# Patient Record
Sex: Female | Born: 1995 | Race: White | Hispanic: No | Marital: Married | Smoking: Never smoker
Health system: Southern US, Community
[De-identification: ages and names within clinical notes are randomized; demographics above are authoritative.]

## PROBLEM LIST (undated history)

## (undated) ENCOUNTER — Inpatient Hospital Stay: Payer: Self-pay

## (undated) DIAGNOSIS — K219 Gastro-esophageal reflux disease without esophagitis: Secondary | ICD-10-CM

## (undated) DIAGNOSIS — G718 Other primary disorders of muscles: Secondary | ICD-10-CM

## (undated) DIAGNOSIS — N83209 Unspecified ovarian cyst, unspecified side: Secondary | ICD-10-CM

## (undated) DIAGNOSIS — F32A Depression, unspecified: Secondary | ICD-10-CM

## (undated) DIAGNOSIS — F329 Major depressive disorder, single episode, unspecified: Secondary | ICD-10-CM

## (undated) DIAGNOSIS — N809 Endometriosis, unspecified: Secondary | ICD-10-CM

## (undated) HISTORY — PX: NO PAST SURGERIES: SHX2092

## (undated) HISTORY — DX: Unspecified ovarian cyst, unspecified side: N83.209

## (undated) HISTORY — DX: Endometriosis, unspecified: N80.9

## (undated) HISTORY — PX: CHOLECYSTECTOMY: SHX55

## (undated) HISTORY — DX: Other primary disorders of muscles: G71.8

## (undated) HISTORY — PX: LOOP RECORDER IMPLANT: SHX5954

## (undated) HISTORY — PX: ABDOMINAL HYSTERECTOMY: SHX81

---
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
## 2013-04-11 ENCOUNTER — Ambulatory Visit: Payer: Self-pay | Admitting: Internal Medicine

## 2013-04-11 LAB — RAPID INFLUENZA A&B ANTIGENS

## 2013-05-08 ENCOUNTER — Emergency Department: Payer: Self-pay | Admitting: Emergency Medicine

## 2013-05-08 LAB — CBC
HCT: 47.7 % — ABNORMAL HIGH (ref 35.0–47.0)
HGB: 16.1 g/dL — ABNORMAL HIGH (ref 12.0–16.0)
MCH: 29.2 pg (ref 26.0–34.0)
MCHC: 33.7 g/dL (ref 32.0–36.0)
MCV: 87 fL (ref 80–100)
Platelet: 278 10*3/uL (ref 150–440)
RBC: 5.5 10*6/uL — ABNORMAL HIGH (ref 3.80–5.20)
RDW: 14.1 % (ref 11.5–14.5)
WBC: 7.7 10*3/uL (ref 3.6–11.0)

## 2013-05-08 LAB — URINALYSIS, COMPLETE
Bilirubin,UR: NEGATIVE
Glucose,UR: NEGATIVE mg/dL (ref 0–75)
KETONE: NEGATIVE
LEUKOCYTE ESTERASE: NEGATIVE
Nitrite: NEGATIVE
PROTEIN: NEGATIVE
Ph: 7 (ref 4.5–8.0)
SPECIFIC GRAVITY: 1.02 (ref 1.003–1.030)

## 2013-05-08 LAB — WET PREP, GENITAL

## 2013-05-08 LAB — GC/CHLAMYDIA PROBE AMP

## 2013-05-08 LAB — HCG, QUANTITATIVE, PREGNANCY

## 2013-07-19 ENCOUNTER — Ambulatory Visit: Payer: Self-pay | Admitting: Physician Assistant

## 2013-07-19 LAB — COMPREHENSIVE METABOLIC PANEL
Albumin: 4.3 g/dL (ref 3.8–5.6)
Alkaline Phosphatase: 126 U/L — ABNORMAL HIGH
Anion Gap: 9 (ref 7–16)
BILIRUBIN TOTAL: 0.4 mg/dL (ref 0.2–1.0)
BUN: 9 mg/dL (ref 7–18)
CALCIUM: 9.2 mg/dL (ref 9.0–10.7)
Chloride: 103 mmol/L (ref 98–107)
Co2: 27 mmol/L (ref 21–32)
Creatinine: 0.66 mg/dL (ref 0.60–1.30)
EGFR (Non-African Amer.): >60
Glucose: 86 mg/dL (ref 65–99)
OSMOLALITY: 276 (ref 275–301)
Potassium: 3.6 mmol/L (ref 3.5–5.1)
SGOT(AST): 13 U/L (ref 0–26)
SGPT (ALT): 22 U/L (ref 12–78)
Sodium: 139 mmol/L (ref 136–145)
Total Protein: 8 g/dL (ref 6.4–8.6)

## 2013-07-19 LAB — CBC WITH DIFFERENTIAL/PLATELET
BASOS PCT: 0.2 %
Basophil #: 0 10*3/uL (ref 0.0–0.1)
EOS ABS: 0.1 10*3/uL (ref 0.0–0.7)
EOS PCT: 1.4 %
HCT: 45.5 % (ref 35.0–47.0)
HGB: 15 g/dL (ref 12.0–16.0)
Lymphocyte #: 2.3 10*3/uL (ref 1.0–3.6)
Lymphocyte %: 21 %
MCH: 28.7 pg (ref 26.0–34.0)
MCHC: 32.9 g/dL (ref 32.0–36.0)
MCV: 87 fL (ref 80–100)
MONOS PCT: 7.6 %
Monocyte #: 0.8 x10 3/mm (ref 0.2–0.9)
Neutrophil #: 7.5 10*3/uL — ABNORMAL HIGH (ref 1.4–6.5)
Neutrophil %: 69.8 %
Platelet: 288 10*3/uL (ref 150–440)
RBC: 5.23 10*6/uL — AB (ref 3.80–5.20)
RDW: 13.7 % (ref 11.5–14.5)
WBC: 10.8 10*3/uL (ref 3.6–11.0)

## 2013-07-19 LAB — URINALYSIS, COMPLETE
GLUCOSE, UR: NEGATIVE mg/dL (ref 0–75)
KETONE: NEGATIVE
Leukocyte Esterase: NEGATIVE
NITRITE: NEGATIVE
Ph: 7.5 (ref 4.5–8.0)
Specific Gravity: 1.015 (ref 1.003–1.030)

## 2013-07-19 LAB — PREGNANCY, URINE: Pregnancy Test, Urine: NEGATIVE m[IU]/mL

## 2013-07-20 ENCOUNTER — Emergency Department: Payer: Self-pay | Admitting: Emergency Medicine

## 2013-07-20 LAB — COMPREHENSIVE METABOLIC PANEL
ALBUMIN: 4.5 g/dL (ref 3.8–5.6)
Alkaline Phosphatase: 129 U/L — ABNORMAL HIGH
Anion Gap: 5 — ABNORMAL LOW (ref 7–16)
BILIRUBIN TOTAL: 0.3 mg/dL (ref 0.2–1.0)
BUN: 9 mg/dL (ref 7–18)
CALCIUM: 9.4 mg/dL (ref 9.0–10.7)
CHLORIDE: 108 mmol/L — AB (ref 98–107)
CO2: 24 mmol/L (ref 21–32)
Creatinine: 0.54 mg/dL — ABNORMAL LOW (ref 0.60–1.30)
EGFR (Non-African Amer.): >60
Glucose: 89 mg/dL (ref 65–99)
OSMOLALITY: 272 (ref 275–301)
Potassium: 3.7 mmol/L (ref 3.5–5.1)
SGOT(AST): 19 U/L (ref 0–26)
SGPT (ALT): 21 U/L (ref 12–78)
Sodium: 137 mmol/L (ref 136–145)
Total Protein: 8.4 g/dL (ref 6.4–8.6)

## 2013-07-20 LAB — CBC WITH DIFFERENTIAL/PLATELET
BASOS ABS: 0 10*3/uL (ref 0.0–0.1)
Basophil %: 0.2 %
EOS PCT: 0.4 %
Eosinophil #: 0.1 10*3/uL (ref 0.0–0.7)
HCT: 47.8 % — ABNORMAL HIGH (ref 35.0–47.0)
HGB: 15.3 g/dL (ref 12.0–16.0)
LYMPHS ABS: 2.2 10*3/uL (ref 1.0–3.6)
Lymphocyte %: 15.2 %
MCH: 28 pg (ref 26.0–34.0)
MCHC: 32 g/dL (ref 32.0–36.0)
MCV: 88 fL (ref 80–100)
Monocyte #: 0.7 x10 3/mm (ref 0.2–0.9)
Monocyte %: 4.6 %
NEUTROS ABS: 11.5 10*3/uL — AB (ref 1.4–6.5)
Neutrophil %: 79.6 %
PLATELETS: 299 10*3/uL (ref 150–440)
RBC: 5.47 10*6/uL — ABNORMAL HIGH (ref 3.80–5.20)
RDW: 13.8 % (ref 11.5–14.5)
WBC: 14.4 10*3/uL — ABNORMAL HIGH (ref 3.6–11.0)

## 2013-07-20 LAB — URINALYSIS, COMPLETE
Bacteria: NONE SEEN
Bilirubin,UR: NEGATIVE
Glucose,UR: NEGATIVE mg/dL (ref 0–75)
Ketone: NEGATIVE
Leukocyte Esterase: NEGATIVE
Nitrite: NEGATIVE
PH: 7 (ref 4.5–8.0)
Protein: NEGATIVE
RBC,UR: <1 /HPF (ref 0–5)
Specific Gravity: 1.004 (ref 1.003–1.030)

## 2013-09-07 ENCOUNTER — Emergency Department: Payer: Self-pay | Admitting: Emergency Medicine

## 2013-09-07 LAB — URINALYSIS, COMPLETE
BILIRUBIN, UR: NEGATIVE
Bacteria: NONE SEEN
GLUCOSE, UR: NEGATIVE mg/dL (ref 0–75)
Ketone: NEGATIVE
LEUKOCYTE ESTERASE: NEGATIVE
Nitrite: NEGATIVE
PROTEIN: NEGATIVE
Ph: 5 (ref 4.5–8.0)
Specific Gravity: 1.023 (ref 1.003–1.030)
WBC UR: NONE SEEN /HPF (ref 0–5)

## 2013-09-07 LAB — CBC WITH DIFFERENTIAL/PLATELET
BASOS ABS: 0 10*3/uL (ref 0.0–0.1)
Basophil %: 0.3 %
EOS ABS: 0.2 10*3/uL (ref 0.0–0.7)
Eosinophil %: 1.7 %
HCT: 46.5 % (ref 35.0–47.0)
HGB: 15.1 g/dL (ref 12.0–16.0)
Lymphocyte #: 2 10*3/uL (ref 1.0–3.6)
Lymphocyte %: 18.4 %
MCH: 28.7 pg (ref 26.0–34.0)
MCHC: 32.4 g/dL (ref 32.0–36.0)
MCV: 89 fL (ref 80–100)
Monocyte #: 0.8 x10 3/mm (ref 0.2–0.9)
Monocyte %: 7.6 %
NEUTROS ABS: 7.7 10*3/uL — AB (ref 1.4–6.5)
NEUTROS PCT: 72 %
Platelet: 234 10*3/uL (ref 150–440)
RBC: 5.25 10*6/uL — ABNORMAL HIGH (ref 3.80–5.20)
RDW: 13.4 % (ref 11.5–14.5)
WBC: 10.7 10*3/uL (ref 3.6–11.0)

## 2013-09-07 LAB — COMPREHENSIVE METABOLIC PANEL
ANION GAP: 7 (ref 7–16)
AST: 22 U/L (ref 0–26)
Albumin: 4.1 g/dL (ref 3.8–5.6)
Alkaline Phosphatase: 120 U/L — ABNORMAL HIGH
BILIRUBIN TOTAL: 0.5 mg/dL (ref 0.2–1.0)
BUN: 10 mg/dL (ref 7–18)
CHLORIDE: 105 mmol/L (ref 98–107)
CO2: 25 mmol/L (ref 21–32)
Calcium, Total: 9.2 mg/dL (ref 9.0–10.7)
Creatinine: 0.56 mg/dL — ABNORMAL LOW (ref 0.60–1.30)
EGFR (African American): >60
EGFR (Non-African Amer.): >60
GLUCOSE: 85 mg/dL (ref 65–99)
Osmolality: 272 (ref 275–301)
POTASSIUM: 3.9 mmol/L (ref 3.5–5.1)
SGPT (ALT): 23 U/L (ref 12–78)
SODIUM: 137 mmol/L (ref 136–145)
Total Protein: 7.8 g/dL (ref 6.4–8.6)

## 2013-09-07 LAB — LIPASE, BLOOD: LIPASE: 141 U/L (ref 73–393)

## 2013-12-14 ENCOUNTER — Inpatient Hospital Stay: Payer: Self-pay | Admitting: Obstetrics and Gynecology

## 2013-12-14 LAB — COMPREHENSIVE METABOLIC PANEL
ALBUMIN: 4 g/dL (ref 3.8–5.6)
Alkaline Phosphatase: 97 U/L
Anion Gap: 9 (ref 7–16)
BILIRUBIN TOTAL: 0.3 mg/dL (ref 0.2–1.0)
BUN: 11 mg/dL (ref 7–18)
CO2: 25 mmol/L (ref 21–32)
CREATININE: 0.66 mg/dL (ref 0.60–1.30)
Calcium, Total: 9.3 mg/dL (ref 9.0–10.7)
Chloride: 105 mmol/L (ref 98–107)
EGFR (Non-African Amer.): >60
GLUCOSE: 83 mg/dL (ref 65–99)
OSMOLALITY: 276 (ref 275–301)
Potassium: 3.9 mmol/L (ref 3.5–5.1)
SGOT(AST): 21 U/L (ref 0–26)
SGPT (ALT): 21 U/L
Sodium: 139 mmol/L (ref 136–145)
Total Protein: 8 g/dL (ref 6.4–8.6)

## 2013-12-14 LAB — CBC WITH DIFFERENTIAL/PLATELET
BASOS ABS: 0 10*3/uL (ref 0.0–0.1)
BASOS PCT: 0.2 %
EOS ABS: 0.1 10*3/uL (ref 0.0–0.7)
Eosinophil %: 0.8 %
HCT: 43.7 % (ref 35.0–47.0)
HGB: 14.4 g/dL (ref 12.0–16.0)
Lymphocyte #: 2.9 10*3/uL (ref 1.0–3.6)
Lymphocyte %: 20.1 %
MCH: 29.2 pg (ref 26.0–34.0)
MCHC: 32.9 g/dL (ref 32.0–36.0)
MCV: 89 fL (ref 80–100)
MONO ABS: 1 x10 3/mm — AB (ref 0.2–0.9)
MONOS PCT: 6.9 %
Neutrophil #: 10.6 10*3/uL — ABNORMAL HIGH (ref 1.4–6.5)
Neutrophil %: 72 %
Platelet: 266 10*3/uL (ref 150–440)
RBC: 4.93 10*6/uL (ref 3.80–5.20)
RDW: 13.1 % (ref 11.5–14.5)
WBC: 14.7 10*3/uL — AB (ref 3.6–11.0)

## 2013-12-14 LAB — URINALYSIS, COMPLETE
BILIRUBIN, UR: NEGATIVE
Bacteria: NONE SEEN
GLUCOSE, UR: NEGATIVE mg/dL (ref 0–75)
Ketone: NEGATIVE
Leukocyte Esterase: NEGATIVE
Nitrite: NEGATIVE
PROTEIN: NEGATIVE
Ph: 7 (ref 4.5–8.0)
RBC,UR: 1 /HPF (ref 0–5)
Specific Gravity: 1.01 (ref 1.003–1.030)
WBC UR: <1 /HPF (ref 0–5)

## 2013-12-14 LAB — LIPASE, BLOOD: Lipase: 173 U/L (ref 73–393)

## 2013-12-14 LAB — GC/CHLAMYDIA PROBE AMP

## 2013-12-15 LAB — CBC WITH DIFFERENTIAL/PLATELET
BASOS ABS: 0 10*3/uL (ref 0.0–0.1)
Basophil %: 0.1 %
EOS ABS: 0.1 10*3/uL (ref 0.0–0.7)
EOS PCT: 0.8 %
HCT: 42 % (ref 35.0–47.0)
HGB: 14.1 g/dL (ref 12.0–16.0)
Lymphocyte #: 3.4 10*3/uL (ref 1.0–3.6)
Lymphocyte %: 23.6 %
MCH: 29.5 pg (ref 26.0–34.0)
MCHC: 33.6 g/dL (ref 32.0–36.0)
MCV: 88 fL (ref 80–100)
Monocyte #: 1.2 x10 3/mm — ABNORMAL HIGH (ref 0.2–0.9)
Monocyte %: 8.6 %
NEUTROS PCT: 66.9 %
Neutrophil #: 9.6 10*3/uL — ABNORMAL HIGH (ref 1.4–6.5)
PLATELETS: 245 10*3/uL (ref 150–440)
RBC: 4.77 10*6/uL (ref 3.80–5.20)
RDW: 13.2 % (ref 11.5–14.5)
WBC: 14.3 10*3/uL — AB (ref 3.6–11.0)

## 2013-12-16 LAB — CBC WITH DIFFERENTIAL/PLATELET
BASOS ABS: 0 10*3/uL (ref 0.0–0.1)
BASOS PCT: 0.1 %
EOS ABS: 0.2 10*3/uL (ref 0.0–0.7)
EOS PCT: 1.3 %
HCT: 42.5 % (ref 35.0–47.0)
HGB: 14.2 g/dL (ref 12.0–16.0)
LYMPHS ABS: 2 10*3/uL (ref 1.0–3.6)
Lymphocyte %: 15.6 %
MCH: 29.3 pg (ref 26.0–34.0)
MCHC: 33.4 g/dL (ref 32.0–36.0)
MCV: 88 fL (ref 80–100)
MONO ABS: 0.9 x10 3/mm (ref 0.2–0.9)
Monocyte %: 7.2 %
NEUTROS ABS: 10 10*3/uL — AB (ref 1.4–6.5)
Neutrophil %: 75.8 %
Platelet: 236 10*3/uL (ref 150–440)
RBC: 4.83 10*6/uL (ref 3.80–5.20)
RDW: 13.2 % (ref 11.5–14.5)
WBC: 13.2 10*3/uL — ABNORMAL HIGH (ref 3.6–11.0)

## 2013-12-16 LAB — PROTIME-INR
INR: 1.1
Prothrombin Time: 13.7 secs (ref 11.5–14.7)

## 2013-12-16 LAB — APTT: Activated PTT: 26.3 secs (ref 23.6–35.9)

## 2013-12-17 LAB — CBC WITH DIFFERENTIAL/PLATELET
Basophil #: 0 10*3/uL (ref 0.0–0.1)
Basophil %: 0.2 %
EOS PCT: 0.6 %
Eosinophil #: 0.1 10*3/uL (ref 0.0–0.7)
HCT: 40.5 % (ref 35.0–47.0)
HGB: 13.1 g/dL (ref 12.0–16.0)
LYMPHS ABS: 1.6 10*3/uL (ref 1.0–3.6)
LYMPHS PCT: 11.8 %
MCH: 28.3 pg (ref 26.0–34.0)
MCHC: 32.5 g/dL (ref 32.0–36.0)
MCV: 87 fL (ref 80–100)
MONO ABS: 1.2 x10 3/mm — AB (ref 0.2–0.9)
MONOS PCT: 8.7 %
NEUTROS ABS: 10.7 10*3/uL — AB (ref 1.4–6.5)
Neutrophil %: 78.7 %
Platelet: 222 10*3/uL (ref 150–440)
RBC: 4.64 10*6/uL (ref 3.80–5.20)
RDW: 13 % (ref 11.5–14.5)
WBC: 13.7 10*3/uL — AB (ref 3.6–11.0)

## 2013-12-17 LAB — CREATININE, SERUM
Creatinine: 0.55 mg/dL — ABNORMAL LOW (ref 0.60–1.30)
EGFR (African American): >60
EGFR (Non-African Amer.): >60

## 2013-12-17 LAB — RAPID HIV SCREEN (HIV 1/2 AB+AG)

## 2013-12-18 LAB — CBC WITH DIFFERENTIAL/PLATELET
BASOS PCT: 0.1 %
Basophil #: 0 10*3/uL (ref 0.0–0.1)
EOS ABS: 0.3 10*3/uL (ref 0.0–0.7)
Eosinophil %: 3.3 %
HCT: 39.7 % (ref 35.0–47.0)
HGB: 13.4 g/dL (ref 12.0–16.0)
LYMPHS ABS: 1.5 10*3/uL (ref 1.0–3.6)
Lymphocyte %: 18.2 %
MCH: 29.7 pg (ref 26.0–34.0)
MCHC: 33.8 g/dL (ref 32.0–36.0)
MCV: 88 fL (ref 80–100)
MONO ABS: 0.8 x10 3/mm (ref 0.2–0.9)
MONOS PCT: 9.6 %
NEUTROS PCT: 68.8 %
Neutrophil #: 5.7 10*3/uL (ref 1.4–6.5)
Platelet: 212 10*3/uL (ref 150–440)
RBC: 4.52 10*6/uL (ref 3.80–5.20)
RDW: 13.3 % (ref 11.5–14.5)
WBC: 8.3 10*3/uL (ref 3.6–11.0)

## 2013-12-19 LAB — CBC WITH DIFFERENTIAL/PLATELET
BASOS PCT: 0.4 %
Basophil #: 0 10*3/uL (ref 0.0–0.1)
Eosinophil #: 0.3 10*3/uL (ref 0.0–0.7)
Eosinophil %: 3.9 %
HCT: 41.2 % (ref 35.0–47.0)
HGB: 13.8 g/dL (ref 12.0–16.0)
LYMPHS PCT: 23.2 %
Lymphocyte #: 1.8 10*3/uL (ref 1.0–3.6)
MCH: 29.5 pg (ref 26.0–34.0)
MCHC: 33.6 g/dL (ref 32.0–36.0)
MCV: 88 fL (ref 80–100)
MONOS PCT: 12.2 %
Monocyte #: 0.9 x10 3/mm (ref 0.2–0.9)
Neutrophil #: 4.7 10*3/uL (ref 1.4–6.5)
Neutrophil %: 60.3 %
Platelet: 222 10*3/uL (ref 150–440)
RBC: 4.69 10*6/uL (ref 3.80–5.20)
RDW: 13.1 % (ref 11.5–14.5)
WBC: 7.7 10*3/uL (ref 3.6–11.0)

## 2014-07-23 NOTE — Consult Note (Signed)
OBGYN Consult Noteof Consult: 9/15/2015of Admission: 9/15/2015Physician: Dr. [place] Bingharlie Vanna Shavers, MD ([facility])OBGYN: Dr. Bonney AidStaebler ([facility])Phys[facility]: [facility] pain x 5 17 y/o [id number] (LMP 11/22/2013, with Mirena in place) with above CC. Hx significant for history of PP depression and irregular heartbeat.  Patient states she's had the pain for 5 days, feels crampy all the time, sharpness that comes and goes and nausea w/o vomiting that comes and goes.  She states she had a temp to 100.0 at home but no dysuria, hematuria, diarrhea, vaginal bleeding or discharge.   nonenoneHx: TSVD x 1. no h/o STIs. Mirena placed 05/2012. no h/o papsnoneaugmentin (hives, rash)no h/o GYN cancersno tobacco/etoh/illicit drug VS normal and stableno MRGsmoderately TTP in lower abdomen but no peritoneal signsmildly TTP in mid low backnegative, per EERno c/c/e  TECHNIQUE:transabdominal and transvaginal ultrasound examinations of thewere performed. Transabdominal technique was performed forimaging of the pelvis including uterus, ovaries, adnexaland pelvic cul-de-sac. It was necessary to proceed withexam following the transabdominal exam to visualize theadnexal regions.  07/20/2013 and 07/19/2013  8.4 x 4.2 x 6.1 cm. No fibroids or other massThe uterus is retroverted.  5.5 mm.  Intrauterine device is identified.ovary 3.0 x 1.3 x 1.5 cm. Normal appearance/no adnexal mass. ovary 3.2 x 1.8 x 1.4 cm. Normal appearance/no adnexal mass.to the left ovary there is a tubular solid vascular structuremeasures 4.1 x 1.7 cm. Considerations include bowel orfallopian tube. No peristalsis is identified within the findings is moderate free pelvic fluid. Retroverted normal appearing uterus.Intrauterine device identified.Left adnexal tubular structure favored to represent thickenedtube.Pelvic inflammatory disease should be considered.A follow-up pelvic ultrasound is suggested in 8-12 weeks. Anmass has not been entirely excluded but  is felt to be less amylase, cmp, u/a, urine pregnancy testremarkable for wbc 14 with left shift pt with complex left adnexal structure concerning for TOA; pt currently stableto GYN: less concerning for torsion due to u/s characteristics, laterality and +AV on imagesmeropenem started. continue until clinical improvement with qday CBCs and once labs and s/s better can transition to PO. If no better, may need IR drainage.up GC/CT *FEN/GI: MIVF, regular diet, IV anti-emeticsPO pain meds prnOOB ad lib   Electronic Signatures: [place] BingPickens, Alaze Garverick (MD) (Signed on 15-Sep-15 22:03)  Authored   Last Updated: 15-Sep-15 22:58 by [place] BingPickens, Avo Schlachter (MD)

## 2014-07-23 NOTE — Consult Note (Signed)
PATIENT NAME:  Stephanie Hampton, Stephanie Hampton MR#:  [id number] DATE OF BIRTH:  1995/08/02  DATE OF CONSULTATION:  12/17/2013  REFERRING PHYSICIAN:  Conard NovakStephen D. Jackson, MD, OB/GYN  CONSULTING PHYSICIAN:  Stann Mainlandavid P. Sampson GoonFitzgerald, MD  REASON FOR CONSULTATION: Possible tubo-ovarian abscess.   HISTORY OF PRESENT ILLNESS: This is a very pleasant 19 year old female with no past medical history except for G1, P1, who uses an IUD for birth control and was admitted for left lower quadrant abdominal pain 5 days prior to admission. This was associated with some low-grade temperatures to 100.0 as well as some nausea. She does have chronic constipation and uses the toilet only every 2 to 3 days. She has not had any prior dysuria, hematuria, vaginal discharge, or bleeding.   PAST MEDICAL HISTORY:  Postpartum depression  and prior history of irregular heartbeat.  She has no history of STDs.   PAST SURGICAL HISTORY: None.   ALLERGIES: AUGMENTIN.   ANTIBIOTICS SINCE ADMISSION: Include meropenem.   FAMILY HISTORY: Noncontributory.   SOCIAL HISTORY: Lives with her partner. No tobacco, or alcohol, or drugs. She has a 791-month-old daughter. No sick contacts. Recent travel only to [place] within the last several weeks.  Has a pet dog at home, but no other animals.   PHYSICAL EXAMINATION: VITAL SIGNS: Temperature 98, pulse 72, blood pressure 99/62, respirations 18, saturations 96% on room air.  GENERAL: She is somewhat overweight, in no acute distress, lying in bed.  HEENT: Pupils equal, round, and reactive to light and accommodation. Oropharynx is clear without any thrush.  NECK:  Supple.  HEART: Regular.  LUNGS: Clear.  ABDOMEN: Soft, nondistended, and very mildly tender to palpation in the left lower quadrant. No rebound or guarding. Normal bowel sounds.  EXTREMITIES: No clubbing, cyanosis, or edema.  SKIN: No rash.   LABORATORY DATA: White blood count on admission was 14.7 with 10,600 neutrophils.  Currently, it is 13.7.   LFTs on admission showed a normal. Renal function is normal. Creatinine of 0.55. Wet prep was ordered, but not done. Gonorrhea and Chlamydia were negative, HIV negative. Urinalysis showed 1 white cell, otherwise negative.   IMAGING:  Ultrasound showed a normal uterus, IUD in place. There is a left adnexal tubular structure favored to represent thickened fallopian tube with some moderate amount of free  pelvic fluid. CT, done September 17, showed prominence of stool in the visualized colon and questionable small thickening in the distal descending colon. There was a crescentic fluid collection along the left adnexa with enhancing margins, likely a mildly dilated fallopian tube. There was prominent stool, mild thickening of the descending colon.   IMPRESSION: A 19 year old, relatively normal past medical history, admitted with left lower quadrant abdominal pain, possible findings of a tubo-ovarian abscess, white blood count of 15, and constipation. Since her CAT scan yesterday, she has been moving her bowels much more regularly, presumably due to the contrast. Her pain is slightly improving. She has not had a fever. SHE IS ALLERGIC TO AUGMENTIN.   RECOMMENDATIONS: 1.  Continue meropenem and doxycycline.  2.  HIV test was negative.  3.  If her pain persists, could consider working her up for GI causes. I suspect some of this pain is due to constipation and now that she is moving her bowels, she may improve.  4.  It is difficult to know what the next step should be since she has been covered quite broadly. I would suspect if her pain continues to improve, she could be discharged on levofloxacin  and Flagyl for another 7-day course with outpatient followup with gynecology to ensure clinical and possibly radiological resolution.   Thank you for the consult. I will be glad to follow with you.   ____________________________ Stann Mainland. Sampson Goon, MD dpf:lr D: 12/17/2013 16:43:43 ET T: 12/17/2013 18:30:23  ET JOB#: [id number]  cc: Stann Mainland. Sampson Goon, MD, <Dictator> Sharmila Wrobleski Sampson Goon MD ELECTRONICALLY SIGNED 01/02/2014 23:53

## 2014-08-01 ENCOUNTER — Ambulatory Visit (INDEPENDENT_AMBULATORY_CARE_PROVIDER_SITE_OTHER): Payer: BLUE CROSS/BLUE SHIELD

## 2014-08-01 ENCOUNTER — Encounter: Payer: Self-pay | Admitting: Podiatry

## 2014-08-01 ENCOUNTER — Ambulatory Visit (INDEPENDENT_AMBULATORY_CARE_PROVIDER_SITE_OTHER): Payer: BLUE CROSS/BLUE SHIELD | Admitting: Podiatry

## 2014-08-01 VITALS — Ht 64.0 in | Wt 175.0 lb

## 2014-08-01 DIAGNOSIS — M722 Plantar fascial fibromatosis: Secondary | ICD-10-CM

## 2014-08-01 DIAGNOSIS — M79672 Pain in left foot: Secondary | ICD-10-CM

## 2014-08-01 MED ORDER — METHYLPREDNISOLONE 4 MG PO TBPK: ORAL_TABLET | ORAL | Status: DC

## 2014-08-01 MED ORDER — MELOXICAM 15 MG PO TABS: 15.0000 mg | ORAL_TABLET | Freq: Every day | ORAL | Status: DC

## 2014-08-01 NOTE — Progress Notes (Signed)
   Subjective:    Patient ID: Stephanie Hampton, female    DOB: Jan 25, 1996, 19 y.o.   MRN: [id number] Pt comes in today with complains of her arch hurting non-stop. She has been seen at an Urgent Care for this . They gave her a ankle brace and did xrays. Urgent Care referred her to a specialists. She went to the Urgent Care 08/28/14.   HPI    Review of Systems  Musculoskeletal: Positive for gait problem.  All other systems reviewed and are negative.      Objective:   Physical Exam: I have reviewed her past mental history medications allergies surgery social history and review of systems. Pulses are strongly palpable bilateral. Neurologic sensorium is intact percent Weinstein monofilament. Deep tendon reflexes are intact bilateral and muscle strength was 5 over 5 dorsiflexion and plantar flexors and inverters everters all of his musculature is intact. Orthopedic evaluation demonstrates all joints distal to the ankle for range of motion without crepitation. Pain on palpation medial calcaneal tubercle of the left heel is prevalent. Radiographic evaluation does demonstrate soft tissue increase in density of the plantar fascia as it courses from the distal origin to its proximal insertion.        Assessment & Plan:  Assessment: Plantar fasciitis left.  Plan: Injected the left heel today with cortisone and local anesthetic. Discussed appropriate shoe gear stretching exercises and ice therapy. Wrote a prescription for Medrol Dosepak to be followed by meloxicam. Dispensed the plantar fascial brace and the night splint. We'll follow up with her in 1 month. We did discuss appropriate shoe gear stretching exercises ice therapy and sugar modifications

## 2014-08-09 NOTE — H&P (Signed)
[facility] Evaluation:  History:   HPI 19 year old G1 P0 with EDC=04/29/2012 by LMP=07/24/2011 presents at 2539 2/7 weeks with c/o worsening contractions. Contractions started last night and patient presented to [facility] earlier with irregular contractions. She was 1 cm at that time-was given Ambien and sent home. She returned with stronger more regular contractions this AM. [facility] for a 12 week ultrasound c/w dates, frequent syncopal episodes/past hx of irregular HR at had a cardiology consult including a Holter monitor, and nausea throughout pregnancy for which she takes Zofran. Has gained about 30# with the pregnancy. LABS: A POS, RI, VI, GBS negative. Received TDAP 11/21    Presents with contractions    Patient's Medical History Palpitations    Patient's Surgical History none    Medications Pre [place]atal Vitamins  Zofran    Allergies Augmentin-rash    Social History none    Family History Non-Contributory   ROS:   ROS see HPI   Exam:   Vital Signs stable  114/59    Urine Protein not completed    General Received Stadol for pain-pt sleepy. Pain relieved from 7 to 3    Mental Status clear    Chest clear    Heart normal sinus rhythm, no murmur/gallop/rubs    Abdomen gravid, tender with contractions    Estimated Fetal Weight Average for gestational age    Fetal Position cephalic    Edema no edema    Pelvic no external lesions, 3/90% per RN exam    Mebranes Intact    FHT normal rate with no decels, reactive strip with baseline 140s and accels    FHT Description mod variability-CAT 1    Fetal Heart Rate 145    Ucx regular, q3-4, mild    Skin dry   Impression:   Impression IUP at 39 2/7 weeks in early labor   Plan:   Plan EFM/NST, monitor contractions and for cervical change, Stadol for pain.   Electronic Signatures: Stephanie Hampton, Stephanie Hampton (CNM)  (Signed 24-Jan-14 07:37)  Authored: [facility] Evaluation   Last Updated: 24-Jan-14 07:37 by Stephanie Hampton,  Jacques Willingham Hampton (CNM)

## 2014-08-31 ENCOUNTER — Ambulatory Visit: Payer: BLUE CROSS/BLUE SHIELD | Admitting: Podiatry

## 2014-11-28 ENCOUNTER — Ambulatory Visit
Admission: EM | Admit: 2014-11-28 | Discharge: 2014-11-28 | Disposition: A | Discharge status: Home / Self Care | Payer: BLUE CROSS/BLUE SHIELD | Attending: Family Medicine | Admitting: Family Medicine

## 2014-11-28 ENCOUNTER — Encounter: Payer: Self-pay | Admitting: Emergency Medicine

## 2014-11-28 DIAGNOSIS — J029 Acute pharyngitis, unspecified: Secondary | ICD-10-CM | POA: Diagnosis not present

## 2014-11-28 DIAGNOSIS — J069 Acute upper respiratory infection, unspecified: Secondary | ICD-10-CM | POA: Diagnosis not present

## 2014-11-28 LAB — RAPID STREP SCREEN (MED CTR MEBANE ONLY): Streptococcus, Group A Screen (Direct): NEGATIVE

## 2014-11-28 MED ORDER — FEXOFENADINE-PSEUDOEPHED ER 60-120 MG PO TB12: 1.0000 | ORAL_TABLET | Freq: Two times a day (BID) | ORAL | Status: DC

## 2014-11-28 MED ORDER — AZITHROMYCIN 250 MG PO TABS: ORAL_TABLET | ORAL | Status: AC

## 2014-11-28 NOTE — Discharge Instructions (Signed)
Pharyngitis Pharyngitis is a sore throat (pharynx). There is redness, pain, and swelling of your throat. HOME CARE   Drink enough fluids to keep your pee (urine) clear or pale yellow.  Only take medicine as told by your doctor.  You may get sick again if you do not take medicine as told. Finish your medicines, even if you start to feel better.  Do not take aspirin.  Rest.  Rinse your mouth (gargle) with salt water ( tsp of salt per 1 qt of water) every 1-2 hours. This will help the pain.  If you are not at risk for choking, you can suck on hard candy or sore throat lozenges. GET HELP IF:  You have large, tender lumps on your neck.  You have a rash.  You cough up green, yellow-brown, or bloody spit. GET HELP RIGHT AWAY IF:   You have a stiff neck.  You drool or cannot swallow liquids.  You throw up (vomit) or are not able to keep medicine or liquids down.  You have very bad pain that does not go away with medicine.  You have problems breathing (not from a stuffy nose). MAKE SURE YOU:   Understand these instructions.  Will watch your condition.  Will get help right away if you are not doing well or get worse. Document Released: 09/04/2007 Document Revised: 01/06/2013 Document Reviewed: 11/23/2012 [facility] Patient Information 2015 [place], [place]. This information is not intended to replace advice given to you by your health care provider. Make sure you discuss any questions you have with your health care provider.  Salt Water Gargle This solution will help make your mouth and throat feel better. HOME CARE INSTRUCTIONS   Mix 1 teaspoon of salt in 8 ounces of warm water.  Gargle with this solution as much or often as you need or as directed. Swish and gargle gently if you have any sores or wounds in your mouth.  Do not swallow this mixture. Document Released: 12/21/2003 Document Revised: 06/10/2011 Document Reviewed: 05/13/2008 [facility] Patient Information 2015  [place], [place]. This information is not intended to replace advice given to you by your health care provider. Make sure you discuss any questions you have with your health care provider.  Upper Respiratory Infection, Adult An upper respiratory infection (URI) is also known as the common cold. It is often caused by a type of germ (virus). Colds are easily spread (contagious). You can pass it to others by kissing, coughing, sneezing, or drinking out of the same glass. Usually, you get better in 1 or 2 weeks.  HOME CARE   Only take medicine as told by your doctor.  Use a warm mist humidifier or breathe in steam from a hot shower.  Drink enough water and fluids to keep your pee (urine) clear or pale yellow.  Get plenty of rest.  Return to work when your temperature is back to normal or as told by your doctor. You may use a face mask and wash your hands to stop your cold from spreading. GET HELP RIGHT AWAY IF:   After the first few days, you feel you are getting worse.  You have questions about your medicine.  You have chills, shortness of breath, or brown or red spit (mucus).  You have yellow or brown snot (nasal discharge) or pain in the face, especially when you bend forward.  You have a fever, puffy (swollen) neck, pain when you swallow, or white spots in the back of your throat.  You  have a bad headache, ear pain, sinus pain, or chest pain.  You have a high-pitched whistling sound when you breathe in and out (wheezing).  You have a lasting cough or cough up blood.  You have sore muscles or a stiff neck. MAKE SURE YOU:   Understand these instructions.  Will watch your condition.  Will get help right away if you are not doing well or get worse. Document Released: 09/04/2007 Document Revised: 06/10/2011 Document Reviewed: 06/23/2013 [facility] Patient Information 2015 [place], [place]. This information is not intended to replace advice given to you by your health care provider.  Make sure you discuss any questions you have with your health care provider.

## 2014-11-28 NOTE — ED Provider Notes (Signed)
CSN: [id number]     Arrival date & time 11/28/14  1019 History   First MD Initiated Contact with Patient 11/28/14 1220     Chief Complaint  Patient presents with  . Sore Throat    She states that the sore throat has been going on for 5 days now. She does admit to having some rhinorrhea as well. (Consider location/radiation/quality/duration/timing/severity/associated sxs/prior Treatment) Patient is a 19 y.o. female presenting with pharyngitis. The history is provided by the patient. No language interpreter was used.  Sore Throat This is a new problem. The current episode started more than 2 days ago (five dsys). The problem occurs constantly. The problem has not changed since onset.Associated symptoms include headaches. Pertinent negatives include no chest pain, no abdominal pain and no shortness of breath. Nothing relieves the symptoms. Treatments tried: gargling w/salt water.     History reviewed. No pertinent past medical history. History reviewed. No pertinent past surgical history. History reviewed. No pertinent family history. Social History  Substance Use Topics  . Smoking status: Never Smoker   . Smokeless tobacco: Never Used  . Alcohol Use: No   OB History    No data available     Review of Systems  Constitutional: Negative.   HENT: Positive for rhinorrhea and sore throat. Negative for ear discharge, ear pain and facial swelling.   Respiratory: Negative for shortness of breath.   Cardiovascular: Negative for chest pain.  Gastrointestinal: Negative for abdominal pain.  Neurological: Positive for headaches.  All other systems reviewed and are negative.   Allergies  Amoxicillin-pot clavulanate  Home Medications   Prior to Admission medications   Medication Sig Start Date End Date Taking? Authorizing Provider  azithromycin (ZITHROMAX Z-PAK) 250 MG tablet Take 2 tablets first day and then 1 po a day for 4 days. If strep culture is positive with patient still symptomatic  after 8/31 until 9/30 patient may fill prescription. 11/30/14 12/30/14  Hassan Rowan, MD  fexofenadine-pseudoephedrine (ALLEGRA-D) 60-120 MG per tablet Take 1 tablet by mouth every 12 (twelve) hours. 11/28/14   Hassan Rowan, MD  meloxicam (MOBIC) 15 MG tablet Take 1 tablet (15 mg total) by mouth daily. 08/01/14   Max T Hyatt, DPM  methylPREDNISolone (MEDROL) 4 MG TBPK tablet Tapering 6 day dose pack 08/01/14   Max T Hyatt, DPM   Meds Ordered and Administered this Visit  Medications - No data to display   She denies any significant family medical history. Patient does not smoke. Nurse's notes reviewed   BP 121/61 mmHg  Pulse 71  Temp(Src) 98.5 F (36.9 C) (Tympanic)  Resp 16  Ht 5\' 4"  (1.626 m)  Wt 180 lb (81.647 kg)  BMI 30.88 kg/m2  SpO2 100%  LMP 11/26/2014 (Exact Date) No data found.   Physical Exam  Constitutional: She is oriented to person, place, and time. She appears well-developed and well-nourished.  HENT:  Head: Normocephalic.  Right Ear: Hearing, tympanic membrane and external ear normal.  Left Ear: Hearing, tympanic membrane and external ear normal.  Nose: Mucosal edema and rhinorrhea present. No epistaxis.  No foreign bodies. Right sinus exhibits frontal sinus tenderness. Right sinus exhibits no maxillary sinus tenderness. Left sinus exhibits no maxillary sinus tenderness and no frontal sinus tenderness.  Mouth/Throat: Uvula is midline and mucous membranes are normal. Posterior oropharyngeal erythema present.  Eyes: Pupils are equal, round, and reactive to light.  Neck: Normal range of motion. Neck supple. No tracheal deviation present.  Musculoskeletal: Normal range of motion.  Lymphadenopathy:    She has cervical adenopathy.  Neurological: She is oriented to person, place, and time.  Skin: Skin is warm and dry.  Psychiatric: She has a normal mood and affect.  Vitals reviewed.   ED Course  Procedures (including critical care time)  Labs Review Labs Reviewed   RAPID STREP SCREEN (NOT AT [facility])   Results for orders placed or performed during the hospital encounter of 11/28/14  Rapid strep screen  Result Value Ref Range   Streptococcus, Group A Screen (Direct) NEGATIVE NEGATIVE    Imaging Review No results found.   Visual Acuity Review  Right Eye Distance:   Left Eye Distance:   Bilateral Distance:    Right Eye Near:   Left Eye Near:    Bilateral Near:         MDM   1. Pharyngitis   2. URI, acute      At this time sore throat has been going on for 5 days. It appears to be more viral in allergy related than bacterial infection. We will place her on Allegra-D 1 tablet a day and work note for today and tomorrow. If she is not better on Wednesday, August 31 will allow her to get a predated prescription for Z-Pak. Also asked her to call to make sure that the strep culture is still negative.   Hassan Rowan, MD 11/28/14 1341

## 2014-11-28 NOTE — ED Notes (Signed)
Patient c/o sore throat for 5 days.  Patient denies fevers.

## 2014-11-30 LAB — CULTURE, GROUP A STREP (THRC)

## 2015-03-22 ENCOUNTER — Emergency Department
Admission: EM | Admit: 2015-03-22 | Discharge: 2015-03-22 | Disposition: A | Discharge status: Home / Self Care | Payer: BLUE CROSS/BLUE SHIELD | Attending: Emergency Medicine | Admitting: Emergency Medicine

## 2015-03-22 ENCOUNTER — Encounter: Payer: Self-pay | Admitting: *Deleted

## 2015-03-22 DIAGNOSIS — R1012 Left upper quadrant pain: Secondary | ICD-10-CM | POA: Diagnosis not present

## 2015-03-22 DIAGNOSIS — Z880 Allergy status to penicillin: Secondary | ICD-10-CM | POA: Insufficient documentation

## 2015-03-22 DIAGNOSIS — R197 Diarrhea, unspecified: Secondary | ICD-10-CM | POA: Diagnosis not present

## 2015-03-22 DIAGNOSIS — Z79899 Other long term (current) drug therapy: Secondary | ICD-10-CM | POA: Insufficient documentation

## 2015-03-22 DIAGNOSIS — R112 Nausea with vomiting, unspecified: Secondary | ICD-10-CM

## 2015-03-22 DIAGNOSIS — Z791 Long term (current) use of non-steroidal anti-inflammatories (NSAID): Secondary | ICD-10-CM | POA: Diagnosis not present

## 2015-03-22 LAB — URINALYSIS COMPLETE WITH MICROSCOPIC (ARMC ONLY)
BACTERIA UA: NONE SEEN
BILIRUBIN URINE: NEGATIVE
GLUCOSE, UA: NEGATIVE mg/dL
HGB URINE DIPSTICK: NEGATIVE
Ketones, ur: NEGATIVE mg/dL
LEUKOCYTES UA: NEGATIVE
NITRITE: NEGATIVE
Protein, ur: 100 mg/dL — AB
SPECIFIC GRAVITY, URINE: 1.024 (ref 1.005–1.030)
pH: 8 (ref 5.0–8.0)

## 2015-03-22 LAB — COMPREHENSIVE METABOLIC PANEL
ALBUMIN: 4.8 g/dL (ref 3.5–5.0)
ALK PHOS: 74 U/L (ref 38–126)
ALT: 16 U/L (ref 14–54)
AST: 17 U/L (ref 15–41)
Anion gap: 8 (ref 5–15)
BILIRUBIN TOTAL: 1 mg/dL (ref 0.3–1.2)
BUN: 10 mg/dL (ref 6–20)
CALCIUM: 9.3 mg/dL (ref 8.9–10.3)
CO2: 21 mmol/L — ABNORMAL LOW (ref 22–32)
CREATININE: 0.61 mg/dL (ref 0.44–1.00)
Chloride: 107 mmol/L (ref 101–111)
GFR calc Af Amer: >60 mL/min (ref >60)
GLUCOSE: 112 mg/dL — AB (ref 65–99)
POTASSIUM: 3.8 mmol/L (ref 3.5–5.1)
Sodium: 136 mmol/L (ref 135–145)
TOTAL PROTEIN: 8.3 g/dL — AB (ref 6.5–8.1)

## 2015-03-22 LAB — CBC
HEMATOCRIT: 45.8 % (ref 35.0–47.0)
Hemoglobin: 15.1 g/dL (ref 12.0–16.0)
MCH: 29.1 pg (ref 26.0–34.0)
MCHC: 33 g/dL (ref 32.0–36.0)
MCV: 88.2 fL (ref 80.0–100.0)
PLATELETS: 238 10*3/uL (ref 150–440)
RBC: 5.2 MIL/uL (ref 3.80–5.20)
RDW: 12.9 % (ref 11.5–14.5)
WBC: 27.3 10*3/uL — AB (ref 3.6–11.0)

## 2015-03-22 LAB — LIPASE, BLOOD: Lipase: 23 U/L (ref 11–51)

## 2015-03-22 MED ORDER — ONDANSETRON HCL 4 MG PO TABS: 4.0000 mg | ORAL_TABLET | Freq: Every day | ORAL | Status: DC | PRN

## 2015-03-22 MED ORDER — METOCLOPRAMIDE HCL 5 MG/ML IJ SOLN
10.0000 mg | Freq: Once | INTRAMUSCULAR | Status: AC
  Administered 2015-03-22: 10 mg via INTRAVENOUS
  Filled 2015-03-22: qty 2

## 2015-03-22 MED ORDER — SODIUM CHLORIDE 0.9 % IV BOLUS (SEPSIS)
1000.0000 mL | Freq: Once | INTRAVENOUS | Status: AC
Start: 2015-03-22 — End: 2015-03-22
  Administered 2015-03-22: 1000 mL via INTRAVENOUS

## 2015-03-22 NOTE — ED Provider Notes (Signed)
[facility] Emergency Department Provider Note   ____________________________________________  Time seen: 1510  I have reviewed the triage vital signs and the nursing notes.   HISTORY  Chief Complaint Emesis   History limited by: Not Limited   HPI LEXI CONATY is a 19 y.o. female who presents to the emergency department today because of concerns for nausea vomiting and diarrhea. She states that the diarrhea and vomiting started this morning upon awakening. She states the symptoms have been fairly constant throughout the day. She has not noticed any bloody diarrhea. She has had some associated abdominal pain. She states it is located primarily in the left upper quadrant. It does radiate to her back. She states that when she has her bowel movement or vomits she feels slightly better. She also has some associated lightheadedness with sitting up. She denies any recent travel, drinking untreated water or ingesting any raw meat or seafood. Denies any contact with anyone with similar symptoms. She denies any fevers.   History reviewed. No pertinent past medical history.  There are no active problems to display for this patient.   History reviewed. No pertinent past surgical history.  Current Outpatient Rx  Name  Route  Sig  Dispense  Refill  . fexofenadine-pseudoephedrine (ALLEGRA-D) 60-120 MG per tablet   Oral   Take 1 tablet by mouth every 12 (twelve) hours.   30 tablet   0   . meloxicam (MOBIC) 15 MG tablet   Oral   Take 1 tablet (15 mg total) by mouth daily.   30 tablet   3   . methylPREDNISolone (MEDROL) 4 MG TBPK tablet      Tapering 6 day dose pack   21 tablet   0     Allergies Amoxicillin-pot clavulanate  No family history on file.  Social History Social History  Substance Use Topics  . Smoking status: Never Smoker   . Smokeless tobacco: Never Used  . Alcohol Use: No    Review of Systems  Constitutional: Negative for  fever. Cardiovascular: Negative for chest pain. Respiratory: Negative for shortness of breath. Gastrointestinal: Positive for nausea vomiting diarrhea and abdominal pain Neurological: Negative for headaches, focal weakness or numbness.  10-point ROS otherwise negative.  ____________________________________________   PHYSICAL EXAM:  VITAL SIGNS: ED Triage Vitals  Enc Vitals Group     BP 03/22/15 1419 127/67 mmHg     Pulse Rate 03/22/15 1419 109     Resp 03/22/15 1419 18     Temp 03/22/15 1419 97.9 F (36.6 C)     Temp Source 03/22/15 1419 Oral     SpO2 03/22/15 1419 99 %     Weight 03/22/15 1419 185 lb (83.915 kg)     Height 03/22/15 1419  (1.626 m)     Head Cir --      Peak Flow --      Pain Score 03/22/15 1428 1   Constitutional: Alert and oriented. Well appearing and in no distress. Eyes: Conjunctivae are normal. PERRL. Normal extraocular movements. ENT   Head: Normocephalic and atraumatic.   Nose: No congestion/rhinnorhea.   Mouth/Throat: Mucous membranes are moist.   Neck: No stridor. Hematological/Lymphatic/Immunilogical: No cervical lymphadenopathy. Cardiovascular: Normal rate, regular rhythm.  No murmurs, rubs, or gallops. Respiratory: Normal respiratory effort without tachypnea nor retractions. Breath sounds are clear and equal bilaterally. No wheezes/rales/rhonchi. Gastrointestinal: Soft and minimally tender to palpation in the left upper quadrant and left abdomen. No tenderness in the right lower quadrant  or right upper quadrant. No rebound. No guarding. No CVA tenderness. Genitourinary: Deferred Musculoskeletal: Normal range of motion in all extremities. No joint effusions.  No lower extremity tenderness nor edema. Neurologic:  Normal speech and language. No gross focal neurologic deficits are appreciated.  Skin:  Skin is warm, dry and intact. No rash noted. Psychiatric: Mood and affect are normal. Speech and behavior are normal. Patient  exhibits appropriate insight and judgment.  ____________________________________________    LABS (pertinent positives/negatives)  Labs Reviewed  COMPREHENSIVE METABOLIC PANEL - Abnormal; Notable for the following:    CO2 21 (*)    Glucose, Bld 112 (*)    Total Protein 8.3 (*)    All other components within normal limits  CBC - Abnormal; Notable for the following:    WBC 27.3 (*)    All other components within normal limits  URINALYSIS COMPLETEWITH MICROSCOPIC ([facility] ONLY) - Abnormal; Notable for the following:    Color, Urine YELLOW (*)    APPearance HAZY (*)    Protein, ur 100 (*)    Squamous Epithelial / LPF 6-30 (*)    All other components within normal limits  LIPASE, BLOOD  POC URINE PREG, ED    ____________________________________________   EKG  None  ____________________________________________    RADIOLOGY  None  ____________________________________________   PROCEDURES  Procedure(s) performed: None  Critical Care performed: No  ____________________________________________   INITIAL IMPRESSION / ASSESSMENT AND PLAN / ED COURSE  Pertinent labs & imaging results that were available during my care of the patient were reviewed by me and considered in my medical decision making (see chart for details).  She presented to the emergency department today with concerns for nausea vomiting diarrhea abdominal pain. Blood work did show a leukocytosis however physical exam is unconcerning. Patient felt better after fluids and medication. Will discharge home with antiemetics. Discussed return precautions particular appendicitis return precautions.  ____________________________________________   FINAL CLINICAL IMPRESSION(S) / ED DIAGNOSES  Final diagnoses:  Nausea and vomiting, vomiting of unspecified type     Phineas SemenGraydon Kiyani Jernigan, MD 03/22/15 2048

## 2015-03-22 NOTE — ED Notes (Signed)
Pt states that this morning when she got up to walk she had "black outs" with dizziness.

## 2015-03-22 NOTE — ED Notes (Signed)
Pt woke up with nausea and vomiting with slight abdominal pain today

## 2015-03-22 NOTE — Discharge Instructions (Signed)
Please seek medical attention for any high fevers, chest pain, shortness of breath, change in behavior, persistent vomiting, bloody stool or any other new or concerning symptoms. ° °Viral Gastroenteritis °Viral gastroenteritis is also known as stomach flu. This condition affects the stomach and intestinal tract. It can cause sudden diarrhea and vomiting. The illness typically lasts 3 to 8 days. Most people develop an immune response that eventually gets rid of the virus. While this natural response develops, the virus can make you quite ill. °CAUSES  °Many different viruses can cause gastroenteritis, such as rotavirus or noroviruses. You can catch one of these viruses by consuming contaminated food or water. You may also catch a virus by sharing utensils or other personal items with an infected person or by touching a contaminated surface. °SYMPTOMS  °The most common symptoms are diarrhea and vomiting. These problems can cause a severe loss of body fluids (dehydration) and a body salt (electrolyte) imbalance. Other symptoms may include: °· Fever. °· Headache. °· Fatigue. °· Abdominal pain. °DIAGNOSIS  °Your caregiver can usually diagnose viral gastroenteritis based on your symptoms and a physical exam. A stool sample may also be taken to test for the presence of viruses or other infections. °TREATMENT  °This illness typically goes away on its own. Treatments are aimed at rehydration. The most serious cases of viral gastroenteritis involve vomiting so severely that you are not able to keep fluids down. In these cases, fluids must be given through an intravenous line (IV). °HOME CARE INSTRUCTIONS  °· Drink enough fluids to keep your urine clear or pale yellow. Drink small amounts of fluids frequently and increase the amounts as tolerated. °· Ask your caregiver for specific rehydration instructions. °· Avoid: °¨ Foods high in sugar. °¨ Alcohol. °¨ Carbonated drinks. °¨ Tobacco. °¨ Juice. °¨ Caffeine  drinks. °¨ Extremely hot or cold fluids. °¨ Fatty, greasy foods. °¨ Too much intake of anything at one time. °¨ Dairy products until 24 to 48 hours after diarrhea stops. °· You may consume probiotics. Probiotics are active cultures of beneficial bacteria. They may lessen the amount and number of diarrheal stools in adults. Probiotics can be found in yogurt with active cultures and in supplements. °· Wash your hands well to avoid spreading the virus. °· Only take over-the-counter or prescription medicines for pain, discomfort, or fever as directed by your caregiver. Do not give aspirin to children. Antidiarrheal medicines are not recommended. °· Ask your caregiver if you should continue to take your regular prescribed and over-the-counter medicines. °· Keep all follow-up appointments as directed by your caregiver. °SEEK IMMEDIATE MEDICAL CARE IF:  °· You are unable to keep fluids down. °· You do not urinate at least once every 6 to 8 hours. °· You develop shortness of breath. °· You notice blood in your stool or vomit. This may look like coffee grounds. °· You have abdominal pain that increases or is concentrated in one small area (localized). °· You have persistent vomiting or diarrhea. °· You have a fever. °· The patient is a child younger than 3 months, and he or she has a fever. °· The patient is a child older than 3 months, and he or she has a fever and persistent symptoms. °· The patient is a child older than 3 months, and he or she has a fever and symptoms suddenly get worse. °· The patient is a baby, and he or she has no tears when crying. °MAKE SURE YOU:  °· Understand these instructions. °·   Will watch your condition. °· Will get help right away if you are not doing well or get worse. °  °This information is not intended to replace advice given to you by your health care provider. Make sure you discuss any questions you have with your health care provider. °  °Document Released: 03/18/2005 Document Revised:  06/10/2011 Document Reviewed: 01/02/2011 °Elsevier Interactive Patient Education ©2016 [employer] ° °

## 2015-11-20 DIAGNOSIS — J029 Acute pharyngitis, unspecified: Secondary | ICD-10-CM | POA: Diagnosis not present

## 2015-11-22 DIAGNOSIS — E663 Overweight: Secondary | ICD-10-CM | POA: Diagnosis not present

## 2015-11-22 DIAGNOSIS — F329 Major depressive disorder, single episode, unspecified: Secondary | ICD-10-CM | POA: Diagnosis not present

## 2015-11-22 DIAGNOSIS — E559 Vitamin D deficiency, unspecified: Secondary | ICD-10-CM | POA: Diagnosis not present

## 2015-11-22 DIAGNOSIS — Z6833 Body mass index (BMI) 33.0-33.9, adult: Secondary | ICD-10-CM | POA: Diagnosis not present

## 2015-11-22 DIAGNOSIS — E785 Hyperlipidemia, unspecified: Secondary | ICD-10-CM | POA: Diagnosis not present

## 2015-11-22 DIAGNOSIS — R5383 Other fatigue: Secondary | ICD-10-CM | POA: Diagnosis not present

## 2016-01-08 ENCOUNTER — Emergency Department: Payer: BLUE CROSS/BLUE SHIELD

## 2016-01-08 ENCOUNTER — Emergency Department
Admission: EM | Admit: 2016-01-08 | Discharge: 2016-01-08 | Disposition: A | Discharge status: Home / Self Care | Payer: BLUE CROSS/BLUE SHIELD | Attending: Emergency Medicine | Admitting: Emergency Medicine

## 2016-01-08 DIAGNOSIS — R350 Frequency of micturition: Secondary | ICD-10-CM | POA: Insufficient documentation

## 2016-01-08 DIAGNOSIS — N83202 Unspecified ovarian cyst, left side: Secondary | ICD-10-CM | POA: Insufficient documentation

## 2016-01-08 DIAGNOSIS — R1032 Left lower quadrant pain: Secondary | ICD-10-CM | POA: Diagnosis present

## 2016-01-08 DIAGNOSIS — R102 Pelvic and perineal pain: Secondary | ICD-10-CM | POA: Diagnosis not present

## 2016-01-08 LAB — CBC
HCT: 44.9 % (ref 35.0–47.0)
Hemoglobin: 15.6 g/dL (ref 12.0–16.0)
MCH: 29.8 pg (ref 26.0–34.0)
MCHC: 34.7 g/dL (ref 32.0–36.0)
MCV: 86.1 fL (ref 80.0–100.0)
PLATELETS: 284 10*3/uL (ref 150–440)
RBC: 5.22 MIL/uL — ABNORMAL HIGH (ref 3.80–5.20)
RDW: 13.1 % (ref 11.5–14.5)
WBC: 12.3 10*3/uL — AB (ref 3.6–11.0)

## 2016-01-08 LAB — LIPASE, BLOOD: Lipase: 26 U/L (ref 11–51)

## 2016-01-08 LAB — COMPREHENSIVE METABOLIC PANEL
ALT: 16 U/L (ref 14–54)
AST: 17 U/L (ref 15–41)
Albumin: 4.8 g/dL (ref 3.5–5.0)
Alkaline Phosphatase: 76 U/L (ref 38–126)
Anion gap: 7 (ref 5–15)
BILIRUBIN TOTAL: 0.3 mg/dL (ref 0.3–1.2)
BUN: 7 mg/dL (ref 6–20)
CALCIUM: 9.5 mg/dL (ref 8.9–10.3)
CO2: 26 mmol/L (ref 22–32)
CREATININE: 0.72 mg/dL (ref 0.44–1.00)
Chloride: 103 mmol/L (ref 101–111)
GFR calc Af Amer: >60 mL/min (ref >60)
Glucose, Bld: 97 mg/dL (ref 65–99)
POTASSIUM: 4.1 mmol/L (ref 3.5–5.1)
Sodium: 136 mmol/L (ref 135–145)
TOTAL PROTEIN: 8.5 g/dL — AB (ref 6.5–8.1)

## 2016-01-08 LAB — URINALYSIS COMPLETE WITH MICROSCOPIC (ARMC ONLY)
BILIRUBIN URINE: NEGATIVE
GLUCOSE, UA: NEGATIVE mg/dL
Ketones, ur: NEGATIVE mg/dL
LEUKOCYTES UA: NEGATIVE
NITRITE: NEGATIVE
Protein, ur: NEGATIVE mg/dL
SPECIFIC GRAVITY, URINE: 1.011 (ref 1.005–1.030)
pH: 7 (ref 5.0–8.0)

## 2016-01-08 LAB — WET PREP, GENITAL
Clue Cells Wet Prep HPF POC: NONE SEEN
SPERM: NONE SEEN
TRICH WET PREP: NONE SEEN
YEAST WET PREP: NONE SEEN

## 2016-01-08 LAB — POCT PREGNANCY, URINE: Preg Test, Ur: NEGATIVE

## 2016-01-08 LAB — CHLAMYDIA/NGC RT PCR (ARMC ONLY)
CHLAMYDIA TR: NOT DETECTED
N gonorrhoeae: NOT DETECTED

## 2016-01-08 MED ORDER — ONDANSETRON HCL 4 MG PO TABS: 4.0000 mg | ORAL_TABLET | Freq: Every day | ORAL | 1 refills | Status: DC | PRN

## 2016-01-08 MED ORDER — DOXYCYCLINE HYCLATE 100 MG PO CAPS: 100.0000 mg | ORAL_CAPSULE | Freq: Two times a day (BID) | ORAL | 0 refills | Status: DC

## 2016-01-08 MED ORDER — OXYCODONE HCL 5 MG PO TABS: 5.0000 mg | ORAL_TABLET | Freq: Three times a day (TID) | ORAL | 0 refills | Status: DC | PRN

## 2016-01-08 MED ORDER — ONDANSETRON 4 MG PO TBDP
ORAL_TABLET | ORAL | Status: AC
  Administered 2016-01-08: 4 mg via ORAL
  Filled 2016-01-08: qty 1

## 2016-01-08 MED ORDER — METRONIDAZOLE 500 MG PO TABS
500.0000 mg | ORAL_TABLET | Freq: Once | ORAL | Status: AC
  Administered 2016-01-08: 500 mg via ORAL
  Filled 2016-01-08: qty 1

## 2016-01-08 MED ORDER — DOXYCYCLINE HYCLATE 100 MG PO TABS
100.0000 mg | ORAL_TABLET | Freq: Once | ORAL | Status: AC
  Administered 2016-01-08: 100 mg via ORAL
  Filled 2016-01-08 (×2): qty 1

## 2016-01-08 MED ORDER — OXYCODONE HCL 5 MG PO TABS
5.0000 mg | ORAL_TABLET | Freq: Once | ORAL | Status: AC
  Administered 2016-01-08: 5 mg via ORAL
  Filled 2016-01-08: qty 1

## 2016-01-08 MED ORDER — METRONIDAZOLE 500 MG PO TABS: 500.0000 mg | ORAL_TABLET | Freq: Two times a day (BID) | ORAL | 0 refills | Status: DC

## 2016-01-08 MED ORDER — ONDANSETRON 4 MG PO TBDP
4.0000 mg | ORAL_TABLET | Freq: Once | ORAL | Status: AC | PRN
  Administered 2016-01-08: 4 mg via ORAL

## 2016-01-08 NOTE — ED Provider Notes (Signed)
[facility] Emergency Department Provider Note        Time seen: ----------------------------------------- 5:28 PM on 01/08/2016 -----------------------------------------    I have reviewed the triage vital signs and the nursing notes.   HISTORY  Chief Complaint Abdominal Pain    HPI Carlena Saxaylor M Lucier is a 20 y.o. female who presents to ER with left lower quadrant abdominal pain that radiates into her left flank. She reports urinary frequency, denies fevers, chills, chest pain, shortness of breath, vomiting or diarrhea. Patient states she had pain similar to this in the past when she had a tubo-ovarian abscess. This resolved with antibiotics and did not require surgery. Patient states the pain is similar.   History reviewed. No pertinent past medical history.  There are no active problems to display for this patient.   History reviewed. No pertinent surgical history.  Allergies Amoxicillin-pot clavulanate  Social History Social History  Substance Use Topics  . Smoking status: Never Smoker  . Smokeless tobacco: Never Used  . Alcohol use No    Review of Systems Constitutional: Negative for fever. Cardiovascular: Negative for chest pain. Respiratory: Negative for shortness of breath. Gastrointestinal: Positive for abdominal pain Genitourinary: Positive for urinary frequency Musculoskeletal: Negative for back pain. Skin: Negative for rash. Neurological: Negative for headaches, focal weakness or numbness.  10-point ROS otherwise negative.  ____________________________________________   PHYSICAL EXAM:  VITAL SIGNS: ED Triage Vitals [01/08/16 1631]  Enc Vitals Group     BP 107/74     Pulse Rate 93     Resp 18     Temp 98.6 F (37 C)     Temp Source Oral     SpO2 99 %     Weight 184 lb (83.5 kg)     Height 5\' 4"  (1.626 m)     Head Circumference      Peak Flow      Pain Score 8     Pain Loc      Pain Edu?      Excl. in GC?      Constitutional: Alert and oriented. Well appearing and in no distress. Eyes: Conjunctivae are normal. PERRL. Normal extraocular movements. ENT   Head: Normocephalic and atraumatic.   Nose: No congestion/rhinnorhea.   Mouth/Throat: Mucous membranes are moist.   Neck: No stridor. Cardiovascular: Normal rate, regular rhythm. No murmurs, rubs, or gallops. Respiratory: Normal respiratory effort without tachypnea nor retractions. Breath sounds are clear and equal bilaterally. No wheezes/rales/rhonchi. Gastrointestinal: Mild left-sided pelvic tenderness, no rebound or guarding. Normal bowel sounds. Musculoskeletal: Nontender with normal range of motion in all extremities. No lower extremity tenderness nor edema. Neurologic:  Normal speech and language. No gross focal neurologic deficits are appreciated.  Skin:  Skin is warm, dry and intact. No rash noted. Psychiatric: Mood and affect are normal. Speech and behavior are normal.  ____________________________________________  ED COURSE:  Pertinent labs & imaging results that were available during my care of the patient were reviewed by me and considered in my medical decision making (see chart for details). Clinical Course  Patient presents in no acute distress, we will assess with basic labs and ultrasound of the pelvis.  Procedures ____________________________________________   LABS (pertinent positives/negatives)  Labs Reviewed  WET PREP, GENITAL - Abnormal; Notable for the following:       Result Value   WBC, Wet Prep HPF POC FEW (*)    All other components within normal limits  COMPREHENSIVE METABOLIC PANEL - Abnormal; Notable for the  following:    Total Protein 8.5 (*)    All other components within normal limits  CBC - Abnormal; Notable for the following:    WBC 12.3 (*)    RBC 5.22 (*)    All other components within normal limits  URINALYSIS COMPLETEWITH MICROSCOPIC ([facility] ONLY) - Abnormal; Notable for the  following:    Color, Urine YELLOW (*)    APPearance CLEAR (*)    Hgb urine dipstick 1+ (*)    Bacteria, UA RARE (*)    Squamous Epithelial / LPF 0-5 (*)    All other components within normal limits  CHLAMYDIA/NGC RT PCR ([facility] ONLY)  LIPASE, BLOOD  POC URINE PREG, ED  POCT PREGNANCY, URINE    RADIOLOGY  Pelvic ultrasound IMPRESSION: Negative for ovarian torsion.  Complex left ovarian cyst measuring up to 4.0 cm. Recommend a 6-12 week follow-up to ensure resolution.  Small amount of free fluid.  Retroverted uterus with an IUD.  ____________________________________________  FINAL ASSESSMENT AND PLAN  Pelvic pain  Plan: Patient with labs and imaging as dictated above. Patient's pain is likely coming from an ovarian cyst. She doesn't history of TOA and given this fact we will start her on doxycycline and Flagyl. I will refer her to OB/GYN for outpatient follow-up. She is stable for discharge at this time.   Emily Filbert, MD   Note: This dictation was prepared with Dragon dictation. Any transcriptional errors that result from this process are unintentional    Emily Filbert, MD 01/08/16 2045

## 2016-01-08 NOTE — ED Triage Notes (Signed)
Pt c/o LLQ abdominal pain that radiates to left flank. Pt reports urinary frequency. Pt alert and oriented X4, active, cooperative, pt in NAD. RR even and unlabored, color WNL.  Denies NVD.

## 2016-01-08 NOTE — ED Notes (Addendum)
Pharm notified of warning allergy to zofran when putting in order, pharmacist states it is ok to give zofran

## 2016-01-10 DIAGNOSIS — R102 Pelvic and perineal pain: Secondary | ICD-10-CM | POA: Diagnosis not present

## 2016-01-10 DIAGNOSIS — Z79899 Other long term (current) drug therapy: Secondary | ICD-10-CM | POA: Diagnosis not present

## 2016-01-10 DIAGNOSIS — N83202 Unspecified ovarian cyst, left side: Secondary | ICD-10-CM | POA: Diagnosis not present

## 2016-01-16 DIAGNOSIS — N83202 Unspecified ovarian cyst, left side: Secondary | ICD-10-CM | POA: Diagnosis not present

## 2016-01-17 ENCOUNTER — Ambulatory Visit (INDEPENDENT_AMBULATORY_CARE_PROVIDER_SITE_OTHER): Payer: BLUE CROSS/BLUE SHIELD | Admitting: Obstetrics and Gynecology

## 2016-01-17 ENCOUNTER — Encounter: Payer: Self-pay | Admitting: Obstetrics and Gynecology

## 2016-01-17 VITALS — BP 147/75 | HR 91 | Ht 64.0 in | Wt 184.5 lb

## 2016-01-17 DIAGNOSIS — R102 Pelvic and perineal pain: Secondary | ICD-10-CM | POA: Diagnosis not present

## 2016-01-17 DIAGNOSIS — Z30431 Encounter for routine checking of intrauterine contraceptive device: Secondary | ICD-10-CM | POA: Diagnosis not present

## 2016-01-17 DIAGNOSIS — N83299 Other ovarian cyst, unspecified side: Secondary | ICD-10-CM

## 2016-01-17 NOTE — H&P (Addendum)
GYN ENCOUNTER NOTE  Subjective: PREOPERATIVE HISTORY AND PHYSICAL       Date of surgery: 01/22/2016 Procedure: Laparoscopic left ovarian cystectomy     Stephanie Hampton is a 20 y.o. [id number] female is here for gynecologic evaluation of the following issues:  1. ER follow-up.    2. Complex left ovarian cyst, symptomatic  20 year old married female para 1001, using a Mirena IUD for contraception and cycle regulation, LMP 01/14/2016, presents for follow-up from ER evaluation for acute left lower quadrant pain 01/07/2016 left lower quadrant pain 7/10 developed 01/08/2016 patient was seen at [facility] ER and was diagnosed with 4.7 cm complex left ovarian cyst; no evidence of torsion; treated with Percocet, not tolerated due to shortness of breath side effects. 01/10/2016 patient was seen at [facility] ER; ultrasound verified 4.7 cm complex left ovarian cyst, likely hemorrhagic, torsion could not be ruled out completely; treated with tramadol with some beneficial effect on pain. Severity of pain on initial presentation was 7 out of 10; subsequent second ER evaluation, pain was 9 out of 10. Currently patient is experiencing pain with nausea with decreased by mouth intake. She is not having any emesis. She is experiencing some intermittent dizziness.  Gynecologic History Patient's last menstrual period was 01/14/2016. Contraception: IUD  Obstetric History OB History  Gravida Para Term Preterm AB Living  1 1 1     1   SAB TAB Ectopic Multiple Live Births          1    # Outcome Date GA Lbr Len/2nd Weight Sex Delivery Anes PTL Lv  1 Term 2014   7 lb 4.8 oz (3.311 kg) F Vag-Spont   LIV      Past Medical History:  Diagnosis Date  . Ovarian cyst     Past Surgical History:  Procedure Laterality Date  . NO PAST SURGERIES      Current Outpatient Prescriptions on File Prior to Visit  Medication Sig Dispense Refill  . ondansetron (ZOFRAN) 4 MG tablet Take 1 tablet (4 mg total) by mouth daily as needed  for nausea or vomiting. 20 tablet 0   No current facility-administered medications on file prior to visit.     Allergies  Allergen Reactions  . Amoxicillin-Pot Clavulanate Rash    Social History   Social History  . Marital status: Married    Spouse name: N/A  . Number of children: N/A  . Years of education: N/A   Occupational History  . Not on file.   Social History Main Topics  . Smoking status: Never Smoker  . Smokeless tobacco: Never Used  . Alcohol use No  . Drug use: No  . Sexual activity: Yes    Birth control/ protection: IUD     Comment: MIRENA INSERTED - 05/2012   Other Topics Concern  . Not on file   Social History Narrative  . No narrative on file    Family History  Problem Relation Age of Onset  . Heart disease Father   . Cancer Neg Hx   . Ovarian cancer Neg Hx   . Colon cancer Neg Hx   . Diabetes Neg Hx     The following portions of the patient's history were reviewed and updated as appropriate: allergies, current medications, past family history, past medical history, past social history, past surgical history and problem list.  Review of Systems Review of Systems - per history of present illness  Objective:   BP (!) 147/75   Pulse 91  Ht 5\' 4"  (1.626 m)   Wt 184 lb 8 oz (83.7 kg)   LMP 01/14/2016   BMI 31.67 kg/m  CONSTITUTIONAL: Well-developed, well-nourished female in no acute distress.  HENT:  Normocephalic, atraumatic.  NECK: Not examined SKIN: Skin is warm and dry. No rash noted. Not diaphoretic. No erythema. No pallor. NEUROLGIC: Alert and oriented to person, place, and time. PSYCHIATRIC: Normal mood and affect. Normal behavior. Normal judgment and thought content. CARDIOVASCULAR:Not Examined RESPIRATORY: Not Examined BREASTS: Not Examined ABDOMEN: Soft, non distended; Non tender.  No Organomegaly. No acute peritoneal signs PELVIC:  External Genitalia: Normal  BUS: Normal  Vagina: Normal  Cervix: Normal; IUD string 3 cm; no  lesions; no cervical motion tenderness  Uterus: Normal size, shape,consistency, mobile, retroverted, nontender  Adnexa: Normal; nonpalpable and nontender  RV: Normal   Bladder: Nontender MUSCULOSKELETAL: Normal range of motion. No tenderness.  No cyanosis, clubbing, or edema.     Assessment:   1. Complex left ovarian cyst, symptomatic 2. Nonacute abdomen 3. Requiring narcotic therapy for symptom management   Plan:   1. Options of management, conservative versus surgical were reviewed 2. Laparoscopy with left ovarian cystectomy and possible peritoneal biopsies is scheduled-01/22/2016 3. Continue with tramadol and ibuprofen for pain control 4. Return for one week postop check after surgery  A total of 30 minutes were spent face-to-face with the patient during the encounter with greater than 50% dealing with counseling and coordination of care.  Addendum: Preoperative counseling-patient is scheduled for laparoscopic left ovarian cystectomy on 01/22/2016. She is understanding of the planned procedure and is aware of and is accepting of all surgical risks which include but are not limited to bleeding, infection, pelvic organ injury with need for repair, blood clot disorders, anesthesia risks, etc. All questions have been answered. Informed consent is given. Patient is ready and willing to proceed with surgery as scheduled.

## 2016-01-17 NOTE — Progress Notes (Signed)
GYN ENCOUNTER NOTE  Subjective:       Stephanie Hampton is a 20 y.o. 181P1001 female is here for gynecologic evaluation of the following issues:  1. ER follow-up.    2. Complex left ovarian cyst, symptomatic  20 year old married female para 1001, using a Mirena IUD for contraception and cycle regulation, LMP 01/14/2016, presents for follow-up from ER evaluation for acute left lower quadrant pain 01/07/2016 left lower quadrant pain 7/10 developed 01/08/2016 patient was seen at [facility] ER and was diagnosed with 4.7 cm complex left ovarian cyst; no evidence of torsion; treated with Percocet, not tolerated due to shortness of breath side effects. 01/10/2016 patient was [facility] at [facility]; ultrasound verified 4.7 cm complex left ovarian cyst, likely hemorrhagic, torsion could not be ruled out completely; treated with tramadol with some beneficial effect on pain. Severity of pain on initial presentation was 7 out of 10; subsequent second ER evaluation, pain was 9 out of 10. Currently patient is experiencing pain with nausea with decreased by mouth intake. She is not having any emesis. She is experiencing some intermittent dizziness.  Gynecologic History Patient's last menstrual period was 01/14/2016. Contraception: IUD  Obstetric History OB History  Gravida Para Term Preterm AB Living  1 1 1     1   SAB TAB Ectopic Multiple Live Births          1    # Outcome Date GA Lbr Len/2nd Weight Sex Delivery Anes PTL Lv  1 Term 2014   7 lb 4.8 oz (3.311 kg) F Vag-Spont   LIV      Past Medical History:  Diagnosis Date  . Ovarian cyst     Past Surgical History:  Procedure Laterality Date  . NO PAST SURGERIES      Current Outpatient Prescriptions on File Prior to Visit  Medication Sig Dispense Refill  . ondansetron (ZOFRAN) 4 MG tablet Take 1 tablet (4 mg total) by mouth daily as needed for nausea or vomiting. 20 tablet 0   No current facility-administered medications on file prior to visit.      Allergies  Allergen Reactions  . Amoxicillin-Pot Clavulanate Rash    Social History   Social History  . Marital status: Married    Spouse name: N/A  . Number of children: N/A  . Years of education: N/A   Occupational History  . Not on file.   Social History Main Topics  . Smoking status: Never Smoker  . Smokeless tobacco: Never Used  . Alcohol use No  . Drug use: No  . Sexual activity: Yes    Birth control/ protection: IUD     Comment: MIRENA INSERTED - 05/2012   Other Topics Concern  . Not on file   Social History Narrative  . No narrative on file    Family History  Problem Relation Age of Onset  . Heart disease Father   . Cancer Neg Hx   . Ovarian cancer Neg Hx   . Colon cancer Neg Hx   . Diabetes Neg Hx     The following portions of the patient's history were reviewed and updated as appropriate: allergies, current medications, past family history, past medical history, past social history, past surgical history and problem list.  Review of Systems Review of Systems - per history of present illness  Objective:   BP (!) 147/75   Pulse 91   Ht 5\' 4"  (1.626 m)   Wt 184 lb 8 oz (83.7 kg)   LMP  01/14/2016   BMI 31.67 kg/m  CONSTITUTIONAL: Well-developed, well-nourished female in no acute distress.  HENT:  Normocephalic, atraumatic.  NECK: Not examined SKIN: Skin is warm and dry. No rash noted. Not diaphoretic. No erythema. No pallor. NEUROLGIC: Alert and oriented to person, place, and time. PSYCHIATRIC: Normal mood and affect. Normal behavior. Normal judgment and thought content. CARDIOVASCULAR:Not Examined RESPIRATORY: Not Examined BREASTS: Not Examined ABDOMEN: Soft, non distended; Non tender.  No Organomegaly. No acute peritoneal signs PELVIC:  External Genitalia: Normal  BUS: Normal  Vagina: Normal  Cervix: Normal; IUD string 3 cm; no lesions; no cervical motion tenderness  Uterus: Normal size, shape,consistency, mobile, retroverted,  nontender  Adnexa: Normal; nonpalpable and nontender  RV: Normal   Bladder: Nontender MUSCULOSKELETAL: Normal range of motion. No tenderness.  No cyanosis, clubbing, or edema.     Assessment:   1. Complex left ovarian cyst, symptomatic 2. Nonacute abdomen 3. Requiring narcotic therapy for symptom management   Plan:   1. Options of management, conservative versus surgical were reviewed 2. Laparoscopy with left ovarian cystectomy and possible peritoneal biopsies is scheduled 3. Continue with tramadol and ibuprofen for pain control 4. Return for one week postop check after surgery  A total of 30 minutes were spent face-to-face with the patient during the encounter with greater than 50% dealing with counseling and coordination of care.  Herold Harms, MD  Note: This dictation was prepared with Dragon dictation along with smaller phrase technology. Any transcriptional errors that result from this process are unintentional.

## 2016-01-18 ENCOUNTER — Encounter
Admission: RE | Admit: 2016-01-18 | Discharge: 2016-01-18 | Disposition: A | Discharge status: Home / Self Care | Payer: BLUE CROSS/BLUE SHIELD | Source: Ambulatory Visit | Attending: Obstetrics and Gynecology | Admitting: Obstetrics and Gynecology

## 2016-01-18 DIAGNOSIS — Z01812 Encounter for preprocedural laboratory examination: Secondary | ICD-10-CM | POA: Insufficient documentation

## 2016-01-18 DIAGNOSIS — N83292 Other ovarian cyst, left side: Secondary | ICD-10-CM | POA: Diagnosis not present

## 2016-01-18 HISTORY — DX: Major depressive disorder, single episode, unspecified: F32.9

## 2016-01-18 HISTORY — DX: Depression, unspecified: F32.A

## 2016-01-18 LAB — CBC WITH DIFFERENTIAL/PLATELET
BASOS ABS: 0 10*3/uL (ref 0–0.1)
Basophils Relative: 0 %
EOS ABS: 0.2 10*3/uL (ref 0–0.7)
EOS PCT: 4 %
HCT: 44.6 % (ref 35.0–47.0)
Hemoglobin: 15.3 g/dL (ref 12.0–16.0)
LYMPHS ABS: 1.7 10*3/uL (ref 1.0–3.6)
LYMPHS PCT: 27 %
MCH: 29.8 pg (ref 26.0–34.0)
MCHC: 34.3 g/dL (ref 32.0–36.0)
MCV: 86.8 fL (ref 80.0–100.0)
MONO ABS: 0.5 10*3/uL (ref 0.2–0.9)
Monocytes Relative: 7 %
Neutro Abs: 4.1 10*3/uL (ref 1.4–6.5)
Neutrophils Relative %: 62 %
PLATELETS: 229 10*3/uL (ref 150–440)
RBC: 5.14 MIL/uL (ref 3.80–5.20)
RDW: 12.9 % (ref 11.5–14.5)
WBC: 6.6 10*3/uL (ref 3.6–11.0)

## 2016-01-18 LAB — RAPID HIV SCREEN (HIV 1/2 AB+AG)
HIV 1/2 Antibodies: NONREACTIVE
HIV-1 P24 ANTIGEN - HIV24: NONREACTIVE

## 2016-01-18 NOTE — Patient Instructions (Signed)
  Your procedure is scheduled on:01/22/16 Report to Day Surgery.[facility]OND FLOOR To find out your arrival time please call [phone] between 1PM - 3PM on 10/20/1`7  Remember: Instructions that are not followed completely may result in serious medical risk, up to and including death, or upon the discretion of your surgeon and anesthesiologist your surgery may need to be rescheduled.    __X__ 1. Do not eat food or drink liquids after midnight. No gum chewing or hard candies.     __X__ 2. No Alcohol for 24 hours before or after surgery.   __X__ 3. Do Not Smoke For 24 Hours Prior to Your Surgery.   ____ 4. Bring all medications with you on the day of surgery if instructed.    _X___ 5. Notify your doctor if there is any change in your medical condition     (cold, fever, infections).       Do not wear jewelry, make-up, hairpins, clips or nail polish.  Do not wear lotions, powders, or perfumes. You may wear deodorant.  Do not shave 48 hours prior to surgery. Men may shave face and neck.  Do not bring valuables to the hospital.    [facility] is not responsible for any belongings or valuables.               Contacts, dentures or bridgework may not be worn into surgery.  Leave your suitcase in the car. After surgery it may be brought to your room.  For patients admitted to the hospital, discharge time is determined by your                treatment team.   Patients discharged the day of surgery will not be allowed to drive home.    ____ Take these medicines the morning of surgery with A SIP OF WATER:    1. NON  2.   3.   4.  5.  6.  ____ Fleet Enema (as directed)   _X___ Use CHG Soap as directed  ____ Use inhalers on the day of surgery  ____ Stop metformin 2 days prior to surgery    ____ Take 1/2 of usual insulin dose the night before surgery and none on the morning of surgery.   ____ Stop Coumadin/Plavix/aspirin on   ____ Stop Anti-inflammatories on    ____  Stop supplements until after surgery.    ____ Bring C-Pap to the hospital.

## 2016-01-18 NOTE — Pre-Procedure Instructions (Signed)
Faxed notice to Dr. Greggory KeeneFrancesco that H&P needs heart and lung assessment.

## 2016-01-19 LAB — RPR: RPR Ser Ql: NONREACTIVE

## 2016-01-22 ENCOUNTER — Encounter: Payer: Self-pay | Admitting: Anesthesiology

## 2016-01-22 ENCOUNTER — Ambulatory Visit: Payer: BLUE CROSS/BLUE SHIELD | Admitting: Anesthesiology

## 2016-01-22 ENCOUNTER — Encounter
Admission: RE | Disposition: A | Discharge status: Home / Self Care | Payer: Self-pay | Source: Ambulatory Visit | Attending: Obstetrics and Gynecology

## 2016-01-22 ENCOUNTER — Ambulatory Visit
Admission: RE | Admit: 2016-01-22 | Discharge: 2016-01-22 | Disposition: A | Discharge status: Home / Self Care | Payer: BLUE CROSS/BLUE SHIELD | Source: Ambulatory Visit | Attending: Obstetrics and Gynecology | Admitting: Obstetrics and Gynecology

## 2016-01-22 DIAGNOSIS — N83299 Other ovarian cyst, unspecified side: Secondary | ICD-10-CM

## 2016-01-22 DIAGNOSIS — Z79899 Other long term (current) drug therapy: Secondary | ICD-10-CM | POA: Insufficient documentation

## 2016-01-22 DIAGNOSIS — E669 Obesity, unspecified: Secondary | ICD-10-CM | POA: Diagnosis not present

## 2016-01-22 DIAGNOSIS — Z880 Allergy status to penicillin: Secondary | ICD-10-CM | POA: Diagnosis not present

## 2016-01-22 DIAGNOSIS — R102 Pelvic and perineal pain: Secondary | ICD-10-CM | POA: Diagnosis not present

## 2016-01-22 DIAGNOSIS — N83292 Other ovarian cyst, left side: Secondary | ICD-10-CM | POA: Diagnosis not present

## 2016-01-22 DIAGNOSIS — F329 Major depressive disorder, single episode, unspecified: Secondary | ICD-10-CM | POA: Insufficient documentation

## 2016-01-22 DIAGNOSIS — Z8249 Family history of ischemic heart disease and other diseases of the circulatory system: Secondary | ICD-10-CM | POA: Diagnosis not present

## 2016-01-22 DIAGNOSIS — Z9889 Other specified postprocedural states: Secondary | ICD-10-CM

## 2016-01-22 DIAGNOSIS — N809 Endometriosis, unspecified: Secondary | ICD-10-CM | POA: Diagnosis not present

## 2016-01-22 DIAGNOSIS — N83202 Unspecified ovarian cyst, left side: Secondary | ICD-10-CM | POA: Diagnosis not present

## 2016-01-22 DIAGNOSIS — N803 Endometriosis of pelvic peritoneum: Secondary | ICD-10-CM | POA: Diagnosis not present

## 2016-01-22 HISTORY — PX: LAPAROSCOPIC OVARIAN CYSTECTOMY: SHX6248

## 2016-01-22 LAB — POCT PREGNANCY, URINE: PREG TEST UR: NEGATIVE

## 2016-01-22 SURGERY — EXCISION, CYST, OVARY, LAPAROSCOPIC
Anesthesia: General | Laterality: Left | Wound class: Clean Contaminated

## 2016-01-22 MED ORDER — IBUPROFEN 800 MG PO TABS: 800.0000 mg | ORAL_TABLET | Freq: Three times a day (TID) | ORAL | 1 refills | Status: DC

## 2016-01-22 MED ORDER — LIDOCAINE HCL (CARDIAC) 20 MG/ML IV SOLN
INTRAVENOUS | Status: DC | PRN
  Administered 2016-01-22: 80 mg via INTRAVENOUS

## 2016-01-22 MED ORDER — OXYCODONE-ACETAMINOPHEN 5-325 MG PO TABS
1.0000 | ORAL_TABLET | ORAL | Status: DC | PRN
  Administered 2016-01-22: 1 via ORAL

## 2016-01-22 MED ORDER — SUGAMMADEX SODIUM 200 MG/2ML IV SOLN
INTRAVENOUS | Status: DC | PRN
  Administered 2016-01-22: 400 mg via INTRAVENOUS

## 2016-01-22 MED ORDER — FENTANYL CITRATE (PF) 100 MCG/2ML IJ SOLN
INTRAMUSCULAR | Status: AC
  Filled 2016-01-22: qty 2

## 2016-01-22 MED ORDER — FENTANYL CITRATE (PF) 100 MCG/2ML IJ SOLN
INTRAMUSCULAR | Status: DC | PRN
  Administered 2016-01-22: 50 ug via INTRAVENOUS
  Administered 2016-01-22: 100 ug via INTRAVENOUS
  Administered 2016-01-22: 50 ug via INTRAVENOUS

## 2016-01-22 MED ORDER — OXYCODONE-ACETAMINOPHEN 5-325 MG PO TABS: 1.0000 | ORAL_TABLET | ORAL | 0 refills | Status: DC | PRN

## 2016-01-22 MED ORDER — MIDAZOLAM HCL 2 MG/2ML IJ SOLN
INTRAMUSCULAR | Status: DC | PRN
  Administered 2016-01-22: 2 mg via INTRAVENOUS

## 2016-01-22 MED ORDER — FAMOTIDINE 20 MG PO TABS
ORAL_TABLET | ORAL | Status: AC
  Administered 2016-01-22: 20 mg via ORAL
  Filled 2016-01-22: qty 1

## 2016-01-22 MED ORDER — ONDANSETRON HCL 4 MG/2ML IJ SOLN
INTRAMUSCULAR | Status: AC
  Filled 2016-01-22: qty 2

## 2016-01-22 MED ORDER — ONDANSETRON HCL 4 MG/2ML IJ SOLN
4.0000 mg | Freq: Once | INTRAMUSCULAR | Status: AC | PRN
  Administered 2016-01-22: 4 mg via INTRAVENOUS

## 2016-01-22 MED ORDER — LACTATED RINGERS IV SOLN
INTRAVENOUS | Status: DC | PRN
  Administered 2016-01-22: 14:00:00 via INTRAVENOUS

## 2016-01-22 MED ORDER — ROCURONIUM BROMIDE 100 MG/10ML IV SOLN
INTRAVENOUS | Status: DC | PRN
  Administered 2016-01-22: 10 mg via INTRAVENOUS
  Administered 2016-01-22: 30 mg via INTRAVENOUS

## 2016-01-22 MED ORDER — DEXAMETHASONE SODIUM PHOSPHATE 10 MG/ML IJ SOLN
INTRAMUSCULAR | Status: DC | PRN
  Administered 2016-01-22: 10 mg via INTRAVENOUS

## 2016-01-22 MED ORDER — ONDANSETRON HCL 4 MG/2ML IJ SOLN
INTRAMUSCULAR | Status: DC | PRN
  Administered 2016-01-22: 4 mg via INTRAVENOUS

## 2016-01-22 MED ORDER — ACETAMINOPHEN 10 MG/ML IV SOLN
INTRAVENOUS | Status: DC | PRN
  Administered 2016-01-22: 1000 mg via INTRAVENOUS

## 2016-01-22 MED ORDER — PROPOFOL 10 MG/ML IV BOLUS
INTRAVENOUS | Status: DC | PRN
  Administered 2016-01-22: 170 mg via INTRAVENOUS

## 2016-01-22 MED ORDER — SODIUM CHLORIDE 0.9 % IJ SOLN
INTRAMUSCULAR | Status: AC
  Filled 2016-01-22: qty 10

## 2016-01-22 MED ORDER — OXYCODONE-ACETAMINOPHEN 5-325 MG PO TABS
ORAL_TABLET | ORAL | Status: AC
  Filled 2016-01-22: qty 1

## 2016-01-22 MED ORDER — LACTATED RINGERS IV SOLN: INTRAVENOUS | Status: DC

## 2016-01-22 MED ORDER — FENTANYL CITRATE (PF) 100 MCG/2ML IJ SOLN
25.0000 ug | INTRAMUSCULAR | Status: DC | PRN
  Administered 2016-01-22 (×3): 25 ug via INTRAVENOUS

## 2016-01-22 MED ORDER — FAMOTIDINE 20 MG PO TABS
20.0000 mg | ORAL_TABLET | Freq: Once | ORAL | Status: AC
  Administered 2016-01-22: 20 mg via ORAL

## 2016-01-22 MED ORDER — PROMETHAZINE HCL 25 MG/ML IJ SOLN
12.5000 mg | Freq: Once | INTRAMUSCULAR | Status: AC
  Administered 2016-01-22: 12.5 mg via INTRAVENOUS

## 2016-01-22 MED ORDER — PROMETHAZINE HCL 25 MG/ML IJ SOLN
INTRAMUSCULAR | Status: AC
  Filled 2016-01-22: qty 1

## 2016-01-22 SURGICAL SUPPLY — 36 items
ANCHOR TIS RET SYS 235ML (MISCELLANEOUS) IMPLANT
BLADE SURG SZ11 CARB STEEL (BLADE) ×2 IMPLANT
CANISTER SUCT 1200ML W/VALVE (MISCELLANEOUS) ×2 IMPLANT
CATH ROBINSON RED A/P 16FR (CATHETERS) ×2 IMPLANT
CHLORAPREP W/TINT 26ML (MISCELLANEOUS) ×2 IMPLANT
DRSG TEGADERM 2-3/8X2-3/4 SM (GAUZE/BANDAGES/DRESSINGS) ×6 IMPLANT
GLOVE BIO SURGEON STRL SZ8 (GLOVE) ×8 IMPLANT
GLOVE INDICATOR 8.0 STRL GRN (GLOVE) ×8 IMPLANT
GOWN STRL REUS W/ TWL LRG LVL3 (GOWN DISPOSABLE) ×2 IMPLANT
GOWN STRL REUS W/ TWL XL LVL3 (GOWN DISPOSABLE) ×1 IMPLANT
GOWN STRL REUS W/TWL LRG LVL3 (GOWN DISPOSABLE) ×2
GOWN STRL REUS W/TWL XL LVL3 (GOWN DISPOSABLE) ×1
IRRIGATION STRYKERFLOW (MISCELLANEOUS) IMPLANT
IRRIGATOR STRYKERFLOW (MISCELLANEOUS)
IV LACTATED RINGERS 1000ML (IV SOLUTION) ×2 IMPLANT
KIT PINK PAD W/HEAD ARE REST (MISCELLANEOUS) ×2
KIT PINK PAD W/HEAD ARM REST (MISCELLANEOUS) ×1 IMPLANT
KIT RM TURNOVER CYSTO AR (KITS) ×2 IMPLANT
LABEL OR SOLS (LABEL) ×2 IMPLANT
LIQUID BAND (GAUZE/BANDAGES/DRESSINGS) IMPLANT
NS IRRIG 1000ML POUR BTL (IV SOLUTION) IMPLANT
NS IRRIG 500ML POUR BTL (IV SOLUTION) ×2 IMPLANT
PACK GYN LAPAROSCOPIC (MISCELLANEOUS) ×2 IMPLANT
PAD OB MATERNITY 4.3X12.25 (PERSONAL CARE ITEMS) ×2 IMPLANT
PAD PREP 24X41 OB/GYN DISP (PERSONAL CARE ITEMS) ×2 IMPLANT
POUCH ENDO CATCH 10MM SPEC (MISCELLANEOUS) IMPLANT
SCISSORS METZENBAUM CVD 33 (INSTRUMENTS) IMPLANT
SHEARS HARMONIC ACE PLUS 36CM (ENDOMECHANICALS) IMPLANT
SLEEVE ENDOPATH XCEL 5M (ENDOMECHANICALS) ×2 IMPLANT
SUT MNCRL 4-0 (SUTURE) ×1
SUT MNCRL 4-0 27XMFL (SUTURE) ×1
SUT VIC AB 0 UR5 27 (SUTURE) ×2 IMPLANT
SUTURE MNCRL 4-0 27XMF (SUTURE) ×1 IMPLANT
TROCAR ENDO BLADELESS 11MM (ENDOMECHANICALS) ×2 IMPLANT
TROCAR XCEL NON-BLD 5MMX100MML (ENDOMECHANICALS) ×2 IMPLANT
TUBING INSUFFLATOR HI FLOW (MISCELLANEOUS) ×2 IMPLANT

## 2016-01-22 NOTE — Transfer of Care (Signed)
Immediate Anesthesia Transfer of Care Note  Patient: Stephanie Hampton  Procedure(s) Performed: Procedure(s) with comments: LAPAROSCOPIC LEFT OVARIAN CYSTECTOMY-POSSIBLE BIOPSIES (Left) - excision/fulgeration of endometriosis  Patient Location: PACU  Anesthesia Type:General  Level of Consciousness: sedated  Airway & Oxygen Therapy: Patient Spontanous Breathing and Patient connected to face mask oxygen  Post-op Assessment: Report given to RN and Post -op Vital signs reviewed and stable  Post vital signs: Reviewed and stable  Last Vitals:  Vitals:   01/22/16 1159 01/22/16 1531  BP: (!) 124/55 (!) 127/52  Pulse: 90 95  Resp: 16 16  Temp: 37 C 36.3 C    Complications: No apparent anesthesia complications

## 2016-01-22 NOTE — H&P (View-Only) (Signed)
GYN ENCOUNTER NOTE  Subjective: PREOPERATIVE HISTORY AND PHYSICAL       Date of surgery: 01/22/2016 Procedure: Laparoscopic left ovarian cystectomy     Stephanie Hampton is a 20 y.o. G39P1001 female is here for gynecologic evaluation of the following issues:  1. ER follow-up.    2. Complex left ovarian cyst, symptomatic  20 year old married female para 1001, using a Mirena IUD for contraception and cycle regulation, LMP 01/14/2016, presents for follow-up from ER evaluation for acute left lower quadrant pain 01/07/2016 left lower quadrant pain 7/10 developed 01/08/2016 patient was seen at [facility] ER and was diagnosed with 4.7 cm complex left ovarian cyst; no evidence of torsion; treated with Percocet, not tolerated due to shortness of breath side effects. 01/10/2016 patient was seen at [facility] ER; ultrasound verified 4.7 cm complex left ovarian cyst, likely hemorrhagic, torsion could not be ruled out completely; treated with tramadol with some beneficial effect on pain. Severity of pain on initial presentation was 7 out of 10; subsequent second ER evaluation, pain was 9 out of 10. Currently patient is experiencing pain with nausea with decreased by mouth intake. She is not having any emesis. She is experiencing some intermittent dizziness.  Gynecologic History Patient's last menstrual period was 01/14/2016. Contraception: IUD  Obstetric History OB History  Gravida Para Term Preterm AB Living  1 1 1     1   SAB TAB Ectopic Multiple Live Births          1    # Outcome Date GA Lbr Len/2nd Weight Sex Delivery Anes PTL Lv  1 Term 2014   7 lb 4.8 oz (3.311 kg) F Vag-Spont   LIV      Past Medical History:  Diagnosis Date  . Ovarian cyst     Past Surgical History:  Procedure Laterality Date  . NO PAST SURGERIES      Current Outpatient Prescriptions on File Prior to Visit  Medication Sig Dispense Refill  . ondansetron (ZOFRAN) 4 MG tablet Take 1 tablet (4 mg total) by mouth daily as needed  for nausea or vomiting. 20 tablet 0   No current facility-administered medications on file prior to visit.     Allergies  Allergen Reactions  . Amoxicillin-Pot Clavulanate Rash    Social History   Social History  . Marital status: Married    Spouse name: N/A  . Number of children: N/A  . Years of education: N/A   Occupational History  . Not on file.   Social History Main Topics  . Smoking status: Never Smoker  . Smokeless tobacco: Never Used  . Alcohol use No  . Drug use: No  . Sexual activity: Yes    Birth control/ protection: IUD     Comment: MIRENA INSERTED - 05/2012   Other Topics Concern  . Not on file   Social History Narrative  . No narrative on file    Family History  Problem Relation Age of Onset  . Heart disease Father   . Cancer Neg Hx   . Ovarian cancer Neg Hx   . Colon cancer Neg Hx   . Diabetes Neg Hx     The following portions of the patient's history were reviewed and updated as appropriate: allergies, current medications, past family history, past medical history, past social history, past surgical history and problem list.  Review of Systems Review of Systems - per history of present illness  Objective:   BP (!) 147/75   Pulse 91  Ht 5\' 4"  (1.626 m)   Wt 184 lb 8 oz (83.7 kg)   LMP 01/14/2016   BMI 31.67 kg/m  CONSTITUTIONAL: Well-developed, well-nourished female in no acute distress.  HENT:  Normocephalic, atraumatic.  NECK: Not examined SKIN: Skin is warm and dry. No rash noted. Not diaphoretic. No erythema. No pallor. NEUROLGIC: Alert and oriented to person, place, and time. PSYCHIATRIC: Normal mood and affect. Normal behavior. Normal judgment and thought content. CARDIOVASCULAR:Not Examined RESPIRATORY: Not Examined BREASTS: Not Examined ABDOMEN: Soft, non distended; Non tender.  No Organomegaly. No acute peritoneal signs PELVIC:  External Genitalia: Normal  BUS: Normal  Vagina: Normal  Cervix: Normal; IUD string 3 cm; no  lesions; no cervical motion tenderness  Uterus: Normal size, shape,consistency, mobile, retroverted, nontender  Adnexa: Normal; nonpalpable and nontender  RV: Normal   Bladder: Nontender MUSCULOSKELETAL: Normal range of motion. No tenderness.  No cyanosis, clubbing, or edema.     Assessment:   1. Complex left ovarian cyst, symptomatic 2. Nonacute abdomen 3. Requiring narcotic therapy for symptom management   Plan:   1. Options of management, conservative versus surgical were reviewed 2. Laparoscopy with left ovarian cystectomy and possible peritoneal biopsies is scheduled-01/22/2016 3. Continue with tramadol and ibuprofen for pain control 4. Return for one week postop check after surgery  A total of 30 minutes were spent face-to-face with the patient during the encounter with greater than 50% dealing with counseling and coordination of care.  Addendum: Preoperative counseling-patient is scheduled for laparoscopic left ovarian cystectomy on 01/22/2016. She is understanding of the planned procedure and is aware of and is accepting of all surgical risks which include but are not limited to bleeding, infection, pelvic organ injury with need for repair, blood clot disorders, anesthesia risks, etc. All questions have been answered. Informed consent is given. Patient is ready and willing to proceed with surgery as scheduled.

## 2016-01-22 NOTE — Op Note (Signed)
OPERATIVE NOTE:  Stephanie Hampton PROCEDURE DATE: 01/22/2016   PREOPERATIVE DIAGNOSIS:  1. Pelvic pain 2. Left ovarian cyst, complex  POSTOPERATIVE DIAGNOSIS:  1. Pelvic pain 2. Endometriosis  PROCEDURE:  Laparoscopic excision and fulguration of endometriosis  SURGEON:  Herold HarmsMartin A Bernadette Gores, MD ASSISTANTS: Dr. Valentino Saxonherry and PA-S Corena HerterAnna Parr ANESTHESIA: General INDICATIONS: 20 y.o. G1P1001 , using Mirena IUD for contraception, presents for surgical evaluation of symptomatic left ovarian cyst. Patient had onset of severe left lower quadrant pain with subsequent evaluation [facility] and [facility] demonstrating a 4.7 cm complex left ovarian cyst. Because of the patient's symptomatology, she desires definitive evaluation. Patient had symptoms suspicious for endometriosis with dysmenorrhea, perimenstrual low backache, intercourse pain; plan was to assess for endometriosis as well.  FINDINGS:  Uterus tubes and ovaries are normal; cul-de-sac white lesion, right and left ovarian fossa demonstrating Brown and white lesions and peritoneal scarring consistent with endometriosis, left uterosacral ligament with powder burn implants. These lesions were excised and cauterized with Kleppinger bipolar forceps except for the ovarian fossa lesions which were very close to the ureters. Appendix was normal.    I/O's: Total I/O In: -  Out: 250 [Urine:250] COUNTS:  YES SPECIMENS: Peritoneal biopsies, multiple  1. Cul-de-sac  2. Left uterosacral ligament  3. Right ovarian fossa  4. Left ovarian fossa  ANTIBIOTIC PROPHYLAXIS:N/A COMPLICATIONS: None immediate  PROCEDURE IN DETAIL: Patient was brought to the operating room placed in supine position. General endotracheal anesthesia was induced without difficulty. She was placed in the dorsal lithotomy position using the Bovie stirrups. A ChloraPrep and Hibiclens abdominal perineal intravaginal prep and drape was performed in  standard fashion. Timeout was completed. Red Robinson catheter was used to drain 250 mL of urine from the bladder. A Hulka tenaculum was placed on the cervix to facilitate uterine manipulation; the IUD strings were noted to be measuring approximately 3 cm. Laparoscopy was performed in standard fashion. Subumbilical vertical incision 5 mm in length was made. The Optiview laparoscopic trocar system was used to place a 5 mm port directly into the abdominal pelvic cavity without evidence of bowel or vascular injury. 2 other 5 mm ports were placed in the right and left lower quadrants respectively. The above-noted findings were photo documented. The biopsies of the cul-de-sac, left uterosacral ligament, and right and left ovarian fossa was were taken with care to avoid the course of the ureters. The cul-de-sac and uterosacral ligament biopsy sites were cauterized with Kleppinger bipolar forceps cautery. Good hemostasis was noted. Upon completion of the survey the pelvis, the procedure was terminated with all instrumentation being removed from the abdominal pelvic cavity. Pneumoperitoneum was released. Incisions were closed with 4-0 Monocryl simple interrupted sutures as well as Dermabond glue. Patient was awakened mobilized and taken in satisfactory condition. Estimated blood loss was minimal. Complications were none.  Stephanie Hampton A. [name], MD, [facility]

## 2016-01-22 NOTE — Anesthesia Procedure Notes (Signed)
Procedure Name: Intubation Performed by: Marlana SalvageJESSUP, Keidan Aumiller Pre-anesthesia Checklist: Patient identified, Patient being monitored, Timeout performed, Emergency Drugs available and Suction available Patient Re-evaluated:Patient Re-evaluated prior to inductionOxygen Delivery Method: Circle system utilized Preoxygenation: Pre-oxygenation with 100% oxygen Intubation Type: IV induction Ventilation: Mask ventilation without difficulty Laryngoscope Size: Mac and 3 Grade View: Grade I Tube type: Oral Tube size: 7.0 mm Number of attempts: 1 Airway Equipment and Method: Stylet Placement Confirmation: ETT inserted through vocal cords under direct vision,  positive ETCO2 and breath sounds checked- equal and bilateral Secured at: 21 cm Tube secured with: Tape Dental Injury: Teeth and Oropharynx as per pre-operative assessment

## 2016-01-22 NOTE — Anesthesia Preprocedure Evaluation (Signed)
Anesthesia Evaluation  Patient identified by MRN, date of birth, ID band Patient awake    Reviewed: Allergy & Precautions, NPO status , Patient's Chart, lab work & pertinent test results, reviewed documented beta blocker date and time   Airway Mallampati: III  TM Distance: >3 FB     Dental  (+) Chipped   Pulmonary           Cardiovascular      Neuro/Psych PSYCHIATRIC DISORDERS Depression    GI/Hepatic   Endo/Other    Renal/GU      Musculoskeletal   Abdominal   Peds  Hematology   Anesthesia Other Findings Obese.  Reproductive/Obstetrics                             Anesthesia Physical Anesthesia Plan  ASA: II  Anesthesia Plan: General   Post-op Pain Management:    Induction: Intravenous  Airway Management Planned: Oral ETT  Additional Equipment:   Intra-op Plan:   Post-operative Plan:   Informed Consent: I have reviewed the patients History and Physical, chart, labs and discussed the procedure including the risks, benefits and alternatives for the proposed anesthesia with the patient or authorized representative who has indicated his/her understanding and acceptance.     Plan Discussed with: CRNA  Anesthesia Plan Comments:         Anesthesia Quick Evaluation

## 2016-01-22 NOTE — Discharge Instructions (Signed)
AMBULATORY SURGERY  DISCHARGE INSTRUCTIONS   1) The drugs that you were given will stay in your system until tomorrow so for the next 24 hours you should not:  A) Drive an automobile B) Make any legal decisions C) Drink any alcoholic beverage   2) You may resume regular meals tomorrow.  Today it is better to start with liquids and gradually work up to solid foods.  You may eat anything you prefer, but it is better to start with liquids, then soup and crackers, and gradually work up to solid foods.   3) Please notify your doctor immediately if you have any unusual bleeding, trouble breathing, redness and pain at the surgery site, drainage, fever, or pain not relieved by medication.    Please contact your physician with any problems or Same Day Surgery at [phone], Monday through Friday 6 am to 4 pm, or [place] at [facility] number at [phone].  Endometriosis Endometriosis is a condition in which the tissue that lines the uterus (endometrium) grows outside of its normal location. The tissue may grow in many locations close to the uterus, but it commonly grows on the ovaries, fallopian tubes, vagina, or bowel. Because the uterus expels, or sheds, its lining every menstrual cycle, there is bleeding wherever the endometrial tissue is located. This can cause pain because blood is irritating to tissues not normally exposed to it.  CAUSES  The cause of endometriosis is not known.  SIGNS AND SYMPTOMS  Often, there are no symptoms. When symptoms are present, they can vary with the location of the displaced tissue. Various symptoms can occur at different times. Although symptoms occur mainly during a woman's menstrual period, they can also occur midcycle and usually stop with menopause. Some people may go months with no symptoms at all. Symptoms may include:   Back or abdominal pain.   Heavier bleeding during periods.   Pain during intercourse.   Painful bowel movements.    Infertility. DIAGNOSIS  Your health care provider will do a physical exam and ask about your symptoms. Various tests may be done, such as:   Blood tests and urine tests. These are done to help rule out other problems.   Ultrasound. This test is done to look for abnormal tissue.   An X-ray of the lower bowel (barium enema).  Laparoscopy. In this procedure, a thin, lighted tube with a tiny camera on the end (laparoscope) is inserted into your abdomen. This helps your health care provider look for abnormal tissue to confirm the diagnosis. The health care provider may also remove a small piece of tissue (biopsy) from any abnormal tissue found. This tissue sample can then be sent to a lab so it can be looked at under a microscope. TREATMENT  Treatment will vary and may include:   Medicines to relieve pain. Nonsteroidal anti-inflammatory drugs (NSAIDs) are a type of pain medicine that can help to relieve the pain caused by endometriosis.  Hormonal therapy. When using hormonal therapy, periods are eliminated. This eliminates the monthly exposure to blood by the displaced endometrial tissue.   Surgery. Surgery may sometimes be done to remove the abnormal endometrial tissue. In severe cases, surgery may be done to remove the fallopian tubes, uterus, and ovaries (hysterectomy). HOME CARE INSTRUCTIONS   Take all medicines as directed by your health care provider. Do not take aspirin because it may increase bleeding when you are not on hormonal therapy.   Avoid activities that produce pain, including sexual activity. SEEK  MEDICAL CARE IF:  You have pelvic pain before, after, or during your periods.  You have pelvic pain between periods that gets worse during your period.  You have pelvic pain during or after sex.  You have pelvic pain with bowel movements or urination, especially during your period.  You have problems getting pregnant.  You have a fever. SEEK IMMEDIATE MEDICAL  CARE IF:   Your pain is severe and is not responding to pain medicine.   You have severe nausea and vomiting, or you cannot keep foods down.   You have pain that is limited to the right lower part of your abdomen.   You have swelling or increasing pain in your abdomen.   You see blood in your stool.  MAKE SURE YOU:   Understand these instructions.  Will watch your condition.  Will get help right away if you are not doing well or get worse.   This information is not intended to replace advice given to you by your health care provider. Make sure you discuss any questions you have with your health care provider.   Document Released: 03/15/2000 Document Revised: 04/08/2014 Document Reviewed: 11/13/2012 Elsevier Interactive Patient Education [employer]2016 [employer]

## 2016-01-22 NOTE — Interval H&P Note (Signed)
History and Physical Interval Note:  01/22/2016 1:52 PM  Stephanie Hampton  has presented today for surgery, with the diagnosis of ovarian cyst, pelvic pain  The various methods of treatment have been discussed with the patient and family. After consideration of risks, benefits and other options for treatment, the patient has consented to  Procedure(s): LAPAROSCOPIC LEFT OVARIAN CYSTECTOMY-POSSIBLE BIOPSIES (Left) as a surgical intervention .  The patient's history has been reviewed, patient examined, no change in status, stable for surgery.  I have reviewed the patient's chart and labs.  Questions were answered to the patient's satisfaction.     Daphine DeutscherMartin A Jahsir Rama

## 2016-01-23 ENCOUNTER — Encounter: Payer: Self-pay | Admitting: Obstetrics and Gynecology

## 2016-01-23 NOTE — Anesthesia Postprocedure Evaluation (Signed)
Anesthesia Post Note  Patient: Carlena Saxaylor M Brindle  Procedure(s) Performed: Procedure(s) (LRB): LAPAROSCOPIC LEFT OVARIAN CYSTECTOMY-POSSIBLE BIOPSIES (Left)  Patient location during evaluation: PACU Anesthesia Type: General Level of consciousness: awake and alert and oriented Pain management: pain level controlled Vital Signs Assessment: post-procedure vital signs reviewed and stable Respiratory status: spontaneous breathing, nonlabored ventilation and respiratory function stable Cardiovascular status: blood pressure returned to baseline and stable Postop Assessment: no signs of nausea or vomiting Anesthetic complications: no    Last Vitals:  Vitals:   01/22/16 1710 01/22/16 1734  BP: 113/63 (!) 115/59  Pulse: 86 89  Resp:  16  Temp:      Last Pain:  Vitals:   01/23/16 0854  TempSrc:   PainSc: 3                  Zamirah Denny

## 2016-01-24 LAB — SURGICAL PATHOLOGY

## 2016-01-30 ENCOUNTER — Ambulatory Visit (INDEPENDENT_AMBULATORY_CARE_PROVIDER_SITE_OTHER): Payer: BLUE CROSS/BLUE SHIELD | Admitting: Obstetrics and Gynecology

## 2016-01-30 ENCOUNTER — Encounter: Payer: Self-pay | Admitting: Obstetrics and Gynecology

## 2016-01-30 VITALS — BP 94/63 | HR 93 | Ht 64.0 in | Wt 185.8 lb

## 2016-01-30 DIAGNOSIS — Z09 Encounter for follow-up examination after completed treatment for conditions other than malignant neoplasm: Secondary | ICD-10-CM

## 2016-01-30 DIAGNOSIS — R102 Pelvic and perineal pain: Secondary | ICD-10-CM

## 2016-01-30 DIAGNOSIS — Z0289 Encounter for other administrative examinations: Secondary | ICD-10-CM

## 2016-01-30 DIAGNOSIS — N809 Endometriosis, unspecified: Secondary | ICD-10-CM

## 2016-01-30 NOTE — Progress Notes (Signed)
Chief complaint: 1. Postop check 2. Endometriosis 3. Status post laparoscopy with excision and fulguration of endometriosis  Patient presents for 1 week postop check. She is doing well with normal bowel and bladder function. Previous pain, left-sided, is resolved at this time. Currently she has her IUD in place.  Pathology from surgery:  DIAGNOSIS:  A. CUL-DE-SAC; BIOPSY:  - CONSISTENT WITH ENDOMETRIOSIS.   B. RIGHT OVARIAN FOSSA; BIOPSY:  - CONSISTENT WITH ENDOMETRIOSIS.   C. LEFT UTEROSACRAL; BIOPSY:  - CONSISTENT WITH ENDOMETRIOSIS.   D. LEFT OVARIAN FOSSA; BIOPSY:  - CONSISTENT WITH ENDOMETRIOSIS.    Findings from surgery are reviewed.  OBJECTIVE: BP 94/63   Pulse 93   Ht 5\' 4"  (1.626 m)   Wt 185 lb 12.8 oz (84.3 kg)   LMP 01/14/2016   BMI 31.89 kg/m  Abdomen: Laparoscopy port sites are healing well; Dermabond is in place.  ASSESSMENT: 1. Endometriosis, status post excision and fulguration of implants 2. Normal 1 week postop check  PLAN: 1. Resume activities as tolerated 2. Return in 3 months for follow-up on endometriosis and symptoms 3. Management of endometriosis has been reviewed with husband and wife. If patient becomes symptomatic with recurrent pain, Depo-Lupron therapy will be considered. When couple decides to conceive, IUD will be removed  Herold HarmsMartin A Kethan Papadopoulos, MD  Note: This dictation was prepared with Dragon dictation along with smaller phrase technology. Any transcriptional errors that result from this process are unintentional.

## 2016-01-30 NOTE — Patient Instructions (Signed)
1. Resume all activities without restriction 2. Return in 3 months for follow-up

## 2016-02-19 ENCOUNTER — Telehealth: Payer: Self-pay | Admitting: Obstetrics and Gynecology

## 2016-02-19 NOTE — Telephone Encounter (Signed)
Pt called and she had surgery a few weeks ago with Dr Tommi Rumpse and she has endometriosis and Dr Tommi Rumpse wanted to get her started on Lupron but at the time she wanted to wait due to not being in as much pain, but she stated over the weekend she was having a lot of pain and wanted to see about starting the l;upron shot, she wasn't sure if she needed to make an appt for this to consult or if we can go ahead and order lupron.

## 2016-02-20 NOTE — Telephone Encounter (Signed)
Pt states at the time of her post op she was not hurting that bad but did have a painful episode yesterday, better today. Pt taking IBP 800mg  3xd and wanted to know what else she can do. Suggested take Tylenol in between doses of IBP  And try heating pad to abdomen/back. If this persist she may need something else for pain until she can get started on Lupron. Pt aware that I will speak to Dr. Cristino MarteseFrancescos' nurse on Monday. Will get Lupron ordered asap. Also an appt was made for 02/27/16 to talk with Dr. Tommi Rumpse about Lupron but this was noted on last visit and will check and see if this appt is necessary.

## 2016-02-26 NOTE — Telephone Encounter (Signed)
Aware per vm she will need to be seen prior to starting lupron.

## 2016-02-28 ENCOUNTER — Ambulatory Visit (INDEPENDENT_AMBULATORY_CARE_PROVIDER_SITE_OTHER): Payer: BLUE CROSS/BLUE SHIELD | Admitting: Obstetrics and Gynecology

## 2016-02-28 VITALS — BP 126/82 | HR 93 | Ht 64.0 in | Wt 187.0 lb

## 2016-02-28 DIAGNOSIS — R102 Pelvic and perineal pain: Secondary | ICD-10-CM

## 2016-02-28 DIAGNOSIS — N809 Endometriosis, unspecified: Secondary | ICD-10-CM

## 2016-02-28 MED ORDER — TRAMADOL HCL 50 MG PO TABS: 50.0000 mg | ORAL_TABLET | Freq: Four times a day (QID) | ORAL | 0 refills | Status: DC | PRN

## 2016-02-28 NOTE — Patient Instructions (Signed)
1. Tramadol 50 mg 4 times a day as needed 2. Return in 1 week for a Lupron injection 3. Return in 2 months for follow-up

## 2016-02-28 NOTE — Progress Notes (Signed)
Chief complaint: 1. Endometriosis 2. Worsening pelvic pain  Patient presents for follow-up one month after laparoscopic excision and fulguration of endometriosis. Patient currently has Mirena IUD in place. Pelvic pain exacerbations have progressively worsened to the point where she cannot function with routine activities of daily living. Last week she was unable to even get out of the car to take her child into preschool. She now wishes to have more aggressive medical management of her endometriosis  Past medical history, past surgical history, problem list, medications, and allergies are reviewed  OBJECTIVE: BP 126/82   Pulse 93   Ht 5\' 4"  (1.626 m)   Wt 187 lb (84.8 kg)   LMP 02/10/2016 (Exact Date)   BMI 32.10 kg/m  Pleasant white female in no acute distress. She is alert and oriented. Abdomen: Soft, nontender without organomegaly Pelvic exam: External genitalia-normal BUS-normal Vagina-normal; no significant discharge Cervix-no lesions; 2/4 cervical motion tenderness Uterus-midplane, mobile, tender 1-2/4, midplane Adnexa-left greater than right tenderness 2/4 Rectovaginal-normal external exam  ASSESSMENT: 1. Worsening endometriosis symptomatology 2. One month status post laparoscopic excision and fulguration of endometriosis  PLAN: 1. Maintain Mirena IUD 2. Begin Depo-Lupron injections 3.75 mg IM every month 6 3. Return in 1 week for first Lupron injection 4. Tramadol 50 mg 4 times a day as needed for pelvic pain  A total of 25 minutes were spent face-to-face with the patient during this encounter and over half of that time involved counseling and coordination of care.  Herold HarmsMartin A Ronella Plunk, MD  Note: This dictation was prepared with Dragon dictation along with smaller phrase technology. Any transcriptional errors that result from this process are unintentional.

## 2016-03-26 DIAGNOSIS — Z20828 Contact with and (suspected) exposure to other viral communicable diseases: Secondary | ICD-10-CM | POA: Diagnosis not present

## 2016-03-26 DIAGNOSIS — J029 Acute pharyngitis, unspecified: Secondary | ICD-10-CM | POA: Diagnosis not present

## 2016-03-26 DIAGNOSIS — J189 Pneumonia, unspecified organism: Secondary | ICD-10-CM | POA: Diagnosis not present

## 2016-03-29 ENCOUNTER — Ambulatory Visit (INDEPENDENT_AMBULATORY_CARE_PROVIDER_SITE_OTHER): Payer: BLUE CROSS/BLUE SHIELD | Admitting: Obstetrics and Gynecology

## 2016-03-29 VITALS — BP 108/70 | HR 88 | Ht 64.0 in | Wt 187.1 lb

## 2016-03-29 DIAGNOSIS — Z79818 Long term (current) use of other agents affecting estrogen receptors and estrogen levels: Secondary | ICD-10-CM | POA: Diagnosis not present

## 2016-03-29 DIAGNOSIS — R102 Pelvic and perineal pain: Secondary | ICD-10-CM | POA: Diagnosis not present

## 2016-03-29 DIAGNOSIS — N809 Endometriosis, unspecified: Secondary | ICD-10-CM | POA: Diagnosis not present

## 2016-03-29 LAB — POCT URINE PREGNANCY: Preg Test, Ur: NEGATIVE

## 2016-03-29 MED ORDER — LEUPROLIDE ACETATE 3.75 MG IM KIT
3.7500 mg | PACK | Freq: Once | INTRAMUSCULAR | Status: AC
  Administered 2016-03-29: 3.75 mg via INTRAMUSCULAR

## 2016-03-29 NOTE — Progress Notes (Signed)
After obtaining consent, and per orders of Dr. Greggory KeeneFrancesco, injection of Lupron [place] 3.75mg  given by [place] Mavis. UPT performed which was negative. Patient instructed to remain in clinic for 20 minutes afterwards, and to report any adverse reaction to me immediately. Pt tolerated injection well. All questions answered. Advised pt f/u in clinic on one month for next injection. Pt advised to hold off on add back therapy until clarified with provider, pt gave verbal understanding.

## 2016-04-01 DIAGNOSIS — J209 Acute bronchitis, unspecified: Secondary | ICD-10-CM | POA: Diagnosis not present

## 2016-04-01 DIAGNOSIS — J019 Acute sinusitis, unspecified: Secondary | ICD-10-CM | POA: Diagnosis not present

## 2016-04-25 DIAGNOSIS — J019 Acute sinusitis, unspecified: Secondary | ICD-10-CM | POA: Diagnosis not present

## 2016-04-25 DIAGNOSIS — R197 Diarrhea, unspecified: Secondary | ICD-10-CM | POA: Diagnosis not present

## 2016-04-25 DIAGNOSIS — R062 Wheezing: Secondary | ICD-10-CM | POA: Diagnosis not present

## 2016-04-25 DIAGNOSIS — B9689 Other specified bacterial agents as the cause of diseases classified elsewhere: Secondary | ICD-10-CM | POA: Diagnosis not present

## 2016-05-01 ENCOUNTER — Encounter: Payer: BLUE CROSS/BLUE SHIELD | Admitting: Obstetrics and Gynecology

## 2016-05-08 ENCOUNTER — Encounter: Payer: BLUE CROSS/BLUE SHIELD | Admitting: Obstetrics and Gynecology

## 2016-05-23 DIAGNOSIS — J029 Acute pharyngitis, unspecified: Secondary | ICD-10-CM | POA: Diagnosis not present

## 2016-05-23 DIAGNOSIS — J111 Influenza due to unidentified influenza virus with other respiratory manifestations: Secondary | ICD-10-CM | POA: Diagnosis not present

## 2016-05-23 DIAGNOSIS — R197 Diarrhea, unspecified: Secondary | ICD-10-CM | POA: Diagnosis not present

## 2016-05-27 DIAGNOSIS — R05 Cough: Secondary | ICD-10-CM | POA: Diagnosis not present

## 2016-05-27 DIAGNOSIS — J1189 Influenza due to unidentified influenza virus with other manifestations: Secondary | ICD-10-CM | POA: Diagnosis not present

## 2016-05-29 DIAGNOSIS — J45909 Unspecified asthma, uncomplicated: Secondary | ICD-10-CM | POA: Diagnosis not present

## 2016-05-29 DIAGNOSIS — F17200 Nicotine dependence, unspecified, uncomplicated: Secondary | ICD-10-CM | POA: Diagnosis not present

## 2016-05-29 DIAGNOSIS — E663 Overweight: Secondary | ICD-10-CM | POA: Diagnosis not present

## 2016-05-29 DIAGNOSIS — J209 Acute bronchitis, unspecified: Secondary | ICD-10-CM | POA: Diagnosis not present

## 2016-05-29 DIAGNOSIS — Z716 Tobacco abuse counseling: Secondary | ICD-10-CM | POA: Diagnosis not present

## 2016-05-29 DIAGNOSIS — R1010 Upper abdominal pain, unspecified: Secondary | ICD-10-CM | POA: Diagnosis not present

## 2016-05-29 DIAGNOSIS — J111 Influenza due to unidentified influenza virus with other respiratory manifestations: Secondary | ICD-10-CM | POA: Diagnosis not present

## 2016-06-05 DIAGNOSIS — E7800 Pure hypercholesterolemia, unspecified: Secondary | ICD-10-CM | POA: Diagnosis not present

## 2016-06-05 DIAGNOSIS — R5383 Other fatigue: Secondary | ICD-10-CM | POA: Diagnosis not present

## 2016-06-05 DIAGNOSIS — K529 Noninfective gastroenteritis and colitis, unspecified: Secondary | ICD-10-CM | POA: Diagnosis not present

## 2016-06-05 DIAGNOSIS — E559 Vitamin D deficiency, unspecified: Secondary | ICD-10-CM | POA: Diagnosis not present

## 2016-06-05 DIAGNOSIS — J45909 Unspecified asthma, uncomplicated: Secondary | ICD-10-CM | POA: Diagnosis not present

## 2016-06-05 DIAGNOSIS — F329 Major depressive disorder, single episode, unspecified: Secondary | ICD-10-CM | POA: Diagnosis not present

## 2016-06-05 DIAGNOSIS — F17200 Nicotine dependence, unspecified, uncomplicated: Secondary | ICD-10-CM | POA: Diagnosis not present

## 2016-06-06 DIAGNOSIS — R109 Unspecified abdominal pain: Secondary | ICD-10-CM | POA: Diagnosis not present

## 2016-06-06 DIAGNOSIS — R112 Nausea with vomiting, unspecified: Secondary | ICD-10-CM | POA: Diagnosis not present

## 2016-06-06 DIAGNOSIS — R5383 Other fatigue: Secondary | ICD-10-CM | POA: Diagnosis not present

## 2016-06-06 DIAGNOSIS — R197 Diarrhea, unspecified: Secondary | ICD-10-CM | POA: Diagnosis not present

## 2016-06-11 DIAGNOSIS — F17200 Nicotine dependence, unspecified, uncomplicated: Secondary | ICD-10-CM | POA: Diagnosis not present

## 2016-06-11 DIAGNOSIS — E663 Overweight: Secondary | ICD-10-CM | POA: Diagnosis not present

## 2016-06-11 DIAGNOSIS — F329 Major depressive disorder, single episode, unspecified: Secondary | ICD-10-CM | POA: Diagnosis not present

## 2016-06-11 DIAGNOSIS — J209 Acute bronchitis, unspecified: Secondary | ICD-10-CM | POA: Diagnosis not present

## 2016-06-27 DIAGNOSIS — F4310 Post-traumatic stress disorder, unspecified: Secondary | ICD-10-CM | POA: Diagnosis not present

## 2016-08-16 ENCOUNTER — Telehealth: Payer: Self-pay | Admitting: Obstetrics and Gynecology

## 2016-08-16 NOTE — Telephone Encounter (Signed)
[facility] lup[facility]. Pt had last lupron 03/2016. She states her pain is tolerable. She is not having cycles. IUD for b/c. At this time she does not want to be on lupron. Advised pt to have ae and f/u prn

## 2016-08-16 NOTE — Telephone Encounter (Signed)
Patient lvm to returning [facility]) call, Patient would like for Nurse to call her back. Please advise.

## 2017-02-04 DIAGNOSIS — H66001 Acute suppurative otitis media without spontaneous rupture of ear drum, right ear: Secondary | ICD-10-CM | POA: Diagnosis not present

## 2017-02-05 ENCOUNTER — Encounter: Payer: Self-pay | Admitting: Emergency Medicine

## 2017-02-05 ENCOUNTER — Ambulatory Visit
Admission: EM | Admit: 2017-02-05 | Discharge: 2017-02-05 | Disposition: A | Discharge status: Home / Self Care | Payer: BLUE CROSS/BLUE SHIELD | Attending: Family Medicine | Admitting: Family Medicine

## 2017-02-05 ENCOUNTER — Other Ambulatory Visit: Payer: Self-pay

## 2017-02-05 DIAGNOSIS — J029 Acute pharyngitis, unspecified: Secondary | ICD-10-CM | POA: Diagnosis not present

## 2017-02-05 DIAGNOSIS — J069 Acute upper respiratory infection, unspecified: Secondary | ICD-10-CM | POA: Diagnosis not present

## 2017-02-05 DIAGNOSIS — B9789 Other viral agents as the cause of diseases classified elsewhere: Secondary | ICD-10-CM | POA: Diagnosis not present

## 2017-02-05 DIAGNOSIS — R05 Cough: Secondary | ICD-10-CM | POA: Diagnosis not present

## 2017-02-05 LAB — RAPID STREP SCREEN (MED CTR MEBANE ONLY): Streptococcus, Group A Screen (Direct): NEGATIVE

## 2017-02-05 MED ORDER — HYDROCOD POLST-CPM POLST ER 10-8 MG/5ML PO SUER: 5.0000 mL | Freq: Two times a day (BID) | ORAL | 0 refills | Status: DC | PRN

## 2017-02-05 NOTE — ED Provider Notes (Signed)
[facility]    CSN: [id number] Arrival date & time: 02/05/17  0810     History   Chief Complaint Chief Complaint  Patient presents with  . Sore Throat    HPI Stephanie Hampton is a 21 y.o. female.   The history is provided by the patient.  Sore Throat   URI  Presenting symptoms: congestion, fatigue, fever and sore throat   Severity:  Moderate Onset quality:  Sudden Timing:  Constant Progression:  Worsening Chronicity:  New Associated symptoms: no sinus pain and no wheezing   Risk factors: sick contacts   Risk factors: not elderly, no chronic cardiac disease, no chronic kidney disease, no chronic respiratory disease, no diabetes mellitus, no immunosuppression, no recent illness and no recent travel     Past Medical History:  Diagnosis Date  . Depression   . Endometriosis   . Ovarian cyst     Patient Active Problem List   Diagnosis Date Noted  . Endometriosis determined by laparoscopy 01/30/2016  . Ovarian cyst, complex 01/17/2016  . Pelvic pain 01/17/2016    Past Surgical History:  Procedure Laterality Date  . NO PAST SURGERIES      OB History    Gravida Para Term Preterm AB Living   1 1 1     1    SAB TAB Ectopic Multiple Live Births           1       Home Medications    Prior to Admission medications   Medication Sig Start Date End Date Taking? Authorizing Provider  azithromycin (ZITHROMAX) 500 MG tablet Take by mouth daily.    [provider]  buPROPion (WELLBUTRIN XL) 300 MG 24 hr tablet Take 300 mg by mouth daily.    [provider]  chlorpheniramine-HYDROcodone (TUSSIONEX PENNKINETIC ER) 10-8 MG/5ML SUER Take 5 mLs every 12 (twelve) hours as needed by mouth. 02/05/17   Payton Mccallumonty, Wilna Pennie, MD  leuprolide (LUPRON) 3.75 MG injection Inject 3.75 mg into the muscle once.    [provider]  traMADol (ULTRAM) 50 MG tablet Take 1 tablet (50 mg total) by mouth every 6 (six) hours as needed. 02/28/16   Defrancesco, Prentice DockerMartin  A, MD    Family History Family History  Problem Relation Age of Onset  . Heart disease Father   . Cancer Neg Hx   . Ovarian cancer Neg Hx   . Colon cancer Neg Hx   . Diabetes Neg Hx     Social History Social History   Tobacco Use  . Smoking status: Never Smoker  . Smokeless tobacco: Never Used  Substance Use Topics  . Alcohol use: No    Alcohol/week: 0.0 oz  . Drug use: No     Allergies   Augmentin [amoxicillin-pot clavulanate]   Review of Systems Review of Systems  Constitutional: Positive for fatigue and fever.  HENT: Positive for congestion and sore throat. Negative for sinus pain.   Respiratory: Negative for wheezing.      Physical Exam Triage Vital Signs ED Triage Vitals  Enc Vitals Group     BP 02/05/17 0821 (!) 124/58     Pulse Rate 02/05/17 0821 94     Resp 02/05/17 0821 16     Temp 02/05/17 0821 98.8 F (37.1 C)     Temp Source 02/05/17 0821 Oral     SpO2 02/05/17 0821 100 %     Weight 02/05/17 0820 185 lb (83.9 kg)     Height 02/05/17  0820 5\' 4"  (1.626 m)     Head Circumference --      Peak Flow --      Pain Score 02/05/17 0821 2     Pain Loc --      Pain Edu? --      Excl. in GC? --    No data found.  Updated Vital Signs BP (!) 124/58 (BP Location: Left Arm)   Pulse 94   Temp 98.8 F (37.1 C) (Oral)   Resp 16   Ht 5\' 4"  (1.626 m)   Wt 185 lb (83.9 kg)   SpO2 100%   BMI 31.76 kg/m   Visual Acuity Right Eye Distance:   Left Eye Distance:   Bilateral Distance:    Right Eye Near:   Left Eye Near:    Bilateral Near:     Physical Exam  Constitutional: She appears well-developed and well-nourished. No distress.  HENT:  Head: Normocephalic and atraumatic.  Right Ear: Tympanic membrane, external ear and ear canal normal.  Left Ear: Tympanic membrane, external ear and ear canal normal.  Nose: No mucosal edema, rhinorrhea, nose lacerations, sinus tenderness, nasal deformity, septal deviation or nasal septal hematoma. No epistaxis.   No foreign bodies. Right sinus exhibits no maxillary sinus tenderness and no frontal sinus tenderness. Left sinus exhibits no maxillary sinus tenderness and no frontal sinus tenderness.  Mouth/Throat: Uvula is midline, oropharynx is clear and moist and mucous membranes are normal. No oropharyngeal exudate.  Eyes: Conjunctivae and EOM are normal. Pupils are equal, round, and reactive to light. Right eye exhibits no discharge. Left eye exhibits no discharge. No scleral icterus.  Neck: Normal range of motion. Neck supple. No thyromegaly present.  Cardiovascular: Normal rate, regular rhythm and normal heart sounds.  Pulmonary/Chest: Effort normal and breath sounds normal. No respiratory distress. She has no wheezes. She has no rales.  Lymphadenopathy:    She has no cervical adenopathy.  Skin: She is not diaphoretic.  Nursing note and vitals reviewed.    UC Treatments / Results  Labs (all labs ordered are listed, but only abnormal results are displayed) Labs Reviewed  RAPID STREP SCREEN (NOT AT [facility])  CULTURE, GROUP A STREP [facility])    EKG  EKG Interpretation None       Radiology No results found.  Procedures Procedures (including critical care time)  Medications Ordered in UC Medications - No data to display   Initial Impression / Assessment and Plan / UC Course  I have reviewed the triage vital signs and the nursing notes.  Pertinent labs & imaging results that were available during my care of the patient were reviewed by me and considered in my medical decision making (see chart for details).       Final Clinical Impressions(s) / UC Diagnoses   Final diagnoses:  Viral URI with cough    ED Discharge Orders        Ordered    chlorpheniramine-HYDROcodone (TUSSIONEX PENNKINETIC ER) 10-8 MG/5ML SUER  Every 12 hours PRN     02/05/17 0843       1. Lab results and diagnosis reviewed with patient 2. rx as per orders above; reviewed possible side effects, interactions,  risks and benefits  3. Recommend supportive treatment with rest, fluids, salt water gargles 4. Follow-up prn if symptoms worsen or don't improve   Controlled Substance Prescriptions [place] Controlled Substance Registry consulted? Not Applicable   Payton Mccallumonty, Cristol Engdahl, MD 02/05/17 1134

## 2017-02-05 NOTE — ED Triage Notes (Signed)
Patient in today c/o sore throat, fever, fatigue x 4 days. Patient has not tried any OTC meds.

## 2017-02-09 LAB — CULTURE, GROUP A STREP (THRC)

## 2017-04-17 ENCOUNTER — Encounter: Payer: Self-pay | Admitting: Obstetrics and Gynecology

## 2017-04-17 ENCOUNTER — Ambulatory Visit: Payer: BLUE CROSS/BLUE SHIELD | Admitting: Obstetrics and Gynecology

## 2017-04-17 VITALS — BP 128/84 | HR 93 | Ht 64.0 in | Wt 192.6 lb

## 2017-04-17 DIAGNOSIS — N809 Endometriosis, unspecified: Secondary | ICD-10-CM

## 2017-04-17 DIAGNOSIS — R102 Pelvic and perineal pain: Secondary | ICD-10-CM | POA: Diagnosis not present

## 2017-04-17 NOTE — Patient Instructions (Addendum)
1.  Mirena IUD is removed today 2.  Recommend prenatal vitamin or multivitamin daily.  The vitamin should contain 0.4 mg of folic acid 3.  Recommend ibuprofen 600 mg 4 times a day and extra strength Tylenol 1000 mg 4 times a day; medications may be taken same time for pain relief 4.  Return in 6 months for annual exam or sooner if conception occurs.

## 2017-04-17 NOTE — Progress Notes (Signed)
Chief complaint: 1.  Worsening pelvic pain 2.  History of endometriosis 3.  Contraceptive management  Stephanie Hampton presents in the company of her spouse for follow-up on pelvic pain which has been worsening recently.  She has been developing irregular bleeding over the past 6 months following a long episode of amenorrhea after one injection of Depo-Lupron in December 2017.  The patient has had a Mirena IUD in place for 4 years and 10 months at this time.  It is scheduled to be removed in March 2019.  With recent onset of irregular bleeding, there has been associated worsening pelvic pain.  She and her husband are desiring to try to conceive in the near future.  Review of surgical findings from laparoscopy on [id number] includes the following: INDICATIONS: 22 y.o. G1P1001 , using Mirena IUD for contraception, presents for surgical evaluation of symptomatic left ovarian cyst. Patient had onset of severe left lower quadrant pain with [facility] demonstrating a 4.7 cm complex left ovarian cyst. Because of the patient's symptomatology, she desires definitive evaluation. Patient had symptoms suspicious for endometriosis with dysmenorrhea, perimenstrual low backache, intercourse pain; plan was to assess for endometriosis as well.  FINDINGS:  Uterus tubes and ovaries are normal; cul-de-sac white lesion, right and left ovarian fossa demonstrating Brown and white lesions and peritoneal scarring consistent with endometriosis, left uterosacral ligament with powder burn implants. These lesions were excised and cauterized with Kleppinger bipolar forceps except for the ovarian fossa lesions which were very close to the ureters. Appendix was normal.  Past medical history, past surgical history, problem list, medications, and allergies are reviewed  OBJECTIVE: BP 128/84   Pulse 93   Ht 5\' 4"  (1.626 m)   Wt 192 lb 9.6 oz (87.4 kg)   LMP  (LMP Unknown)  Comment: spotting frequently  BMI 33.06 kg/m  Pleasant female in no acute distress.  Alert and oriented. Back: No CVA tenderness Abdomen: Soft, nontender without organomegaly Pelvic exam: External genitalia-normal BUS-normal Normal estrogen effect; no significant discharge Cervix-IUD strings are visualized and 3 cm in length; 1/4 cervical motion tenderness Uterus-midplane, mobile, tender 1/4; normal size and shape Adnexa-nonpalpable; mild tenderness 1/4 bilaterally Rectovaginal-normal external exam  PROCEDURE: IUD removal Verbal consent is given.  Patient is placed in the dorsolithotomy position.  A Graves speculum was placed into the vagina and the IUD strings are visualized.  [place]ozeman forceps is used to grasp the IUD strings with easy removal of the Mirena IUD.  No significant bleeding.  Procedure is well-tolerated.  ASSESSMENT: 1.  New onset worsening pelvic pain, likely endometriosis related 2.  Mirena IUD in place for years 10 months; desires to be removed 3.  New-onset abnormal uterine bleeding over the past 6 months with associated pelvic pain 4.  Desires conception  PLAN: 1.  Mirena IUD is removed 2.  Recommend ibuprofen 600 mg 4 times a day and exercise Tylenol 1 g 4 times a day for pain control 3.  Recommend prenatal vitamin daily 4.  Return in 6 months for annual exam or follow-up sooner if conception occurs.  A total of 15 minutes were spent face-to-face with the patient during this encounter and over half of that time dealt with counseling and coordination of care.  Herold HarmsMartin A Lacey Wallman, MD  Note: This dictation was prepared with Dragon dictation along with smaller phrase technology. Any transcriptional errors that result from this process are unintentional.

## 2017-05-24 DIAGNOSIS — Z136 Encounter for screening for cardiovascular disorders: Secondary | ICD-10-CM | POA: Diagnosis not present

## 2017-05-24 DIAGNOSIS — Z6832 Body mass index (BMI) 32.0-32.9, adult: Secondary | ICD-10-CM | POA: Diagnosis not present

## 2017-05-24 DIAGNOSIS — Z713 Dietary counseling and surveillance: Secondary | ICD-10-CM | POA: Diagnosis not present

## 2017-05-24 DIAGNOSIS — Z13220 Encounter for screening for lipoid disorders: Secondary | ICD-10-CM | POA: Diagnosis not present

## 2017-07-08 ENCOUNTER — Encounter: Payer: Self-pay | Admitting: Emergency Medicine

## 2017-07-08 ENCOUNTER — Emergency Department: Payer: BLUE CROSS/BLUE SHIELD

## 2017-07-08 ENCOUNTER — Emergency Department
Admission: EM | Admit: 2017-07-08 | Discharge: 2017-07-08 | Disposition: A | Discharge status: Home / Self Care | Payer: BLUE CROSS/BLUE SHIELD | Attending: Emergency Medicine | Admitting: Emergency Medicine

## 2017-07-08 DIAGNOSIS — R42 Dizziness and giddiness: Secondary | ICD-10-CM | POA: Insufficient documentation

## 2017-07-08 DIAGNOSIS — R1084 Generalized abdominal pain: Secondary | ICD-10-CM | POA: Diagnosis not present

## 2017-07-08 DIAGNOSIS — R197 Diarrhea, unspecified: Secondary | ICD-10-CM | POA: Insufficient documentation

## 2017-07-08 DIAGNOSIS — R112 Nausea with vomiting, unspecified: Secondary | ICD-10-CM | POA: Insufficient documentation

## 2017-07-08 DIAGNOSIS — R1010 Upper abdominal pain, unspecified: Secondary | ICD-10-CM | POA: Diagnosis not present

## 2017-07-08 DIAGNOSIS — R531 Weakness: Secondary | ICD-10-CM | POA: Insufficient documentation

## 2017-07-08 DIAGNOSIS — R109 Unspecified abdominal pain: Secondary | ICD-10-CM

## 2017-07-08 DIAGNOSIS — Z79899 Other long term (current) drug therapy: Secondary | ICD-10-CM | POA: Diagnosis not present

## 2017-07-08 DIAGNOSIS — R000 Tachycardia, unspecified: Secondary | ICD-10-CM | POA: Diagnosis not present

## 2017-07-08 DIAGNOSIS — O26891 Other specified pregnancy related conditions, first trimester: Secondary | ICD-10-CM | POA: Diagnosis not present

## 2017-07-08 DIAGNOSIS — O9989 Other specified diseases and conditions complicating pregnancy, childbirth and the puerperium: Secondary | ICD-10-CM | POA: Diagnosis not present

## 2017-07-08 DIAGNOSIS — O210 Mild hyperemesis gravidarum: Secondary | ICD-10-CM | POA: Diagnosis not present

## 2017-07-08 LAB — CBC WITH DIFFERENTIAL/PLATELET
BASOS ABS: 0 10*3/uL (ref 0–0.1)
Basophils Relative: 0 %
EOS ABS: 0 10*3/uL (ref 0–0.7)
Eosinophils Relative: 0 %
HEMATOCRIT: 38.8 % (ref 35.0–47.0)
HEMOGLOBIN: 13.5 g/dL (ref 12.0–16.0)
Lymphocytes Relative: 6 %
Lymphs Abs: 0.9 10*3/uL — ABNORMAL LOW (ref 1.0–3.6)
MCH: 29.6 pg (ref 26.0–34.0)
MCHC: 34.9 g/dL (ref 32.0–36.0)
MCV: 84.7 fL (ref 80.0–100.0)
MONOS PCT: 6 %
Monocytes Absolute: 0.8 10*3/uL (ref 0.2–0.9)
NEUTROS ABS: 13 10*3/uL — AB (ref 1.4–6.5)
NEUTROS PCT: 88 %
Platelets: 208 10*3/uL (ref 150–440)
RBC: 4.58 MIL/uL (ref 3.80–5.20)
RDW: 13 % (ref 11.5–14.5)
WBC: 14.8 10*3/uL — AB (ref 3.6–11.0)

## 2017-07-08 LAB — COMPREHENSIVE METABOLIC PANEL
ALBUMIN: 5 g/dL (ref 3.5–5.0)
ALK PHOS: 81 U/L (ref 38–126)
ALT: 20 U/L (ref 14–54)
ANION GAP: 9 (ref 5–15)
AST: 23 U/L (ref 15–41)
BILIRUBIN TOTAL: 0.9 mg/dL (ref 0.3–1.2)
BUN: 7 mg/dL (ref 6–20)
CO2: 23 mmol/L (ref 22–32)
Calcium: 9.6 mg/dL (ref 8.9–10.3)
Chloride: 103 mmol/L (ref 101–111)
Creatinine, Ser: 0.61 mg/dL (ref 0.44–1.00)
GFR calc Af Amer: >60 mL/min (ref >60)
GFR calc non Af Amer: >60 mL/min (ref >60)
GLUCOSE: 116 mg/dL — AB (ref 65–99)
POTASSIUM: 4.1 mmol/L (ref 3.5–5.1)
SODIUM: 135 mmol/L (ref 135–145)
TOTAL PROTEIN: 8.9 g/dL — AB (ref 6.5–8.1)

## 2017-07-08 LAB — URINALYSIS, COMPLETE (UACMP) WITH MICROSCOPIC
BACTERIA UA: NONE SEEN
Bilirubin Urine: NEGATIVE
Glucose, UA: NEGATIVE mg/dL
Ketones, ur: 80 mg/dL — AB
Leukocytes, UA: NEGATIVE
NITRITE: NEGATIVE
PROTEIN: NEGATIVE mg/dL
SPECIFIC GRAVITY, URINE: 1.015 (ref 1.005–1.030)
pH: 7 (ref 5.0–8.0)

## 2017-07-08 LAB — CBC
HEMATOCRIT: 47.1 % — AB (ref 35.0–47.0)
HEMOGLOBIN: 15.7 g/dL (ref 12.0–16.0)
MCH: 28.9 pg (ref 26.0–34.0)
MCHC: 33.3 g/dL (ref 32.0–36.0)
MCV: 86.7 fL (ref 80.0–100.0)
Platelets: 263 10*3/uL (ref 150–440)
RBC: 5.44 MIL/uL — ABNORMAL HIGH (ref 3.80–5.20)
RDW: 13 % (ref 11.5–14.5)
WBC: 24.5 10*3/uL — ABNORMAL HIGH (ref 3.6–11.0)

## 2017-07-08 LAB — POCT PREGNANCY, URINE: Preg Test, Ur: POSITIVE — AB

## 2017-07-08 LAB — LIPASE, BLOOD: Lipase: 30 U/L (ref 11–51)

## 2017-07-08 LAB — HCG, QUANTITATIVE, PREGNANCY: HCG, BETA CHAIN, QUANT, S: 714 m[IU]/mL — AB (ref <5)

## 2017-07-08 MED ORDER — PROMETHAZINE HCL 25 MG/ML IJ SOLN
INTRAMUSCULAR | Status: AC
  Administered 2017-07-08: 12.5 mg
  Filled 2017-07-08: qty 1

## 2017-07-08 MED ORDER — HYDROCODONE-ACETAMINOPHEN 5-325 MG PO TABS: 1.0000 | ORAL_TABLET | ORAL | 0 refills | Status: DC | PRN

## 2017-07-08 MED ORDER — ALUM & MAG HYDROXIDE-SIMETH 200-200-20 MG/5ML PO SUSP
30.0000 mL | Freq: Once | ORAL | Status: AC
  Administered 2017-07-08: 30 mL via ORAL
  Filled 2017-07-08: qty 30

## 2017-07-08 NOTE — ED Notes (Signed)
Patient transported to Ultrasound 

## 2017-07-08 NOTE — ED Notes (Addendum)
Pt completed 1,03600ml of NS.  20G IV catheter was removed from the Pt right AC.

## 2017-07-08 NOTE — ED Triage Notes (Signed)
Patient presents to the ED with abdominal pain x 2 days and diarrhea, nausea and vomiting that began yesterday.  Patient reports diarrhea x 7 and vomiting x 3.  Patient states she has also had multiple positive home pregnancy tests.  She states she would be approx. [redacted] weeks pregnant based on LMP.

## 2017-07-08 NOTE — ED Provider Notes (Addendum)
[facility] Emergency Department Provider Note   ____________________________________________   First MD Initiated Contact with Patient 07/08/17 1149     (approximate)  I have reviewed the triage vital signs and the nursing notes.   HISTORY  Chief Complaint Abdominal Pain; Diarrhea; and Emesis    HPI Stephanie Hampton is a 22 y.o. female Patient reports achy abdominal pain with occasional episodes of severe pain lasts about a minute that sound like severe cramps running across the top of the abdomen. Then developed nausea vomiting and diarrhea beginning yesterday 7 diarrheas and 3 episodes of vomiting. She's had positive pregnancy test and was supposed to see Dr. Greggory Keenefrancesco tomorrow. She was feeling weak and lightheaded as well. No one else is sick at home. She is not running a fever.   Past Medical History:  Diagnosis Date  . Depression   . Endometriosis   . Ovarian cyst     Patient Active Problem List   Diagnosis Date Noted  . Endometriosis determined by laparoscopy 01/30/2016  . Pelvic pain 01/17/2016    Past Surgical History:  Procedure Laterality Date  . LAPAROSCOPIC OVARIAN CYSTECTOMY Left 01/22/2016   Procedure: LAPAROSCOPIC LEFT OVARIAN CYSTECTOMY-POSSIBLE BIOPSIES;  Surgeon: Herold HarmsMartin A Defrancesco, MD;  Location: [facility] ORS;  Service: Gynecology;  Laterality: Left;  excision/fulgeration of endometriosis  . NO PAST SURGERIES      Prior to Admission medications   Medication Sig Start Date End Date Taking? Authorizing Provider  Prenatal Vit-Fe Fumarate-FA (PRENATAL PO) Take 1 tablet by mouth every evening.   Yes [provider]  HYDROcodone-acetaminophen (NORCO) 5-325 MG tablet Take 1 tablet by mouth every 4 (four) hours as needed for moderate pain. 07/08/17   Arnaldo NatalMalinda, Henya Aguallo F, MD    Allergies Augmentin [amoxicillin-pot clavulanate]  Family History  Problem Relation Age of Onset  . Heart disease Father   . Cancer Neg Hx   .  Ovarian cancer Neg Hx   . Colon cancer Neg Hx   . Diabetes Neg Hx     Social History Social History   Tobacco Use  . Smoking status: Never Smoker  . Smokeless tobacco: Never Used  Substance Use Topics  . Alcohol use: No    Alcohol/week: 0.0 oz  . Drug use: No    Review of Systems { Constitutional: No fever/chills Eyes: No visual changes. ENT: No sore throat. Cardiovascular: Denies chest pain. Respiratory: Denies shortness of breath. Gastrointestinal: Nsee history of present illness Genitourinary: Negative for dysuria. Musculoskeletal: Negative for back pain. Skin: Negative for rash. Neurological: Negative for headaches, focal weakness   ____________________________________________   PHYSICAL EXAM:  VITAL SIGNS: ED Triage Vitals  Enc Vitals Group     BP 07/08/17 1016 132/74     Pulse Rate 07/08/17 1016 (!) 134     Resp 07/08/17 1016 18     Temp 07/08/17 1016 99.7 F (37.6 C)     Temp Source 07/08/17 1016 Oral     SpO2 07/08/17 1016 100 %     Weight 07/08/17 1018 195 lb (88.5 kg)     Height 07/08/17 1018 5\' 4"  (1.626 m)     Head Circumference --      Peak Flow --      Pain Score 07/08/17 1017 7     Pain Loc --      Pain Edu? --      Excl. in GC? --     Constitutional: Alert and oriented. Well appearing and in no acute  distress. Eyes: Conjunctivae are normal. PERRL. EOMI. Head: Atraumatic. Nose: No congestion/rhinnorhea. Mouth/Throat: Mucous membranes are moist.  Oropharynx non-erythematous. Neck: No stridor.  Cardiovascular: Normal rate, regular rhythm. Grossly normal heart sounds.  Good peripheral circulation. Respiratory: Normal respiratory effort.  No retractions. Lungs CTAB. Gastrointestinal: Soft mildly diffusely tender no focal tenderness. No distention. No abdominal bruits. No CVA tenderness. Musculoskeletal: No lower extremity tenderness nor edema.  No joint effusions. Neurologic:  Normal speech and language. No gross focal neurologic deficits  are appreciated.  Skin:  Skin is warm, dry and intact. No rash noted. Psychiatric: Mood and affect are normal. Speech and behavior are normal.  ____________________________________________   LABS (all labs ordered are listed, but only abnormal results are displayed)  Labs Reviewed  COMPREHENSIVE METABOLIC PANEL - Abnormal; Notable for the following components:      Result Value   Glucose, Bld 116 (*)    Total Protein 8.9 (*)    All other components within normal limits  CBC - Abnormal; Notable for the following components:   WBC 24.5 (*)    RBC 5.44 (*)    HCT 47.1 (*)    All other components within normal limits  URINALYSIS, COMPLETE (UACMP) WITH MICROSCOPIC - Abnormal; Notable for the following components:   Color, Urine YELLOW (*)    APPearance CLEAR (*)    Hgb urine dipstick SMALL (*)    Ketones, ur 80 (*)    Squamous Epithelial / LPF 0-5 (*)    All other components within normal limits  HCG, QUANTITATIVE, PREGNANCY - Abnormal; Notable for the following components:   hCG, Beta Chain, Quant, S 714 (*)    All other components within normal limits  POCT PREGNANCY, URINE - Abnormal; Notable for the following components:   Preg Test, Ur POSITIVE (*)    All other components within normal limits  GASTROINTESTINAL PANEL BY PCR, STOOL (REPLACES STOOL CULTURE)  C DIFFICILE QUICK SCREEN W PCR REFLEX  LIPASE, BLOOD  DIFFERENTIAL  POC URINE PREG, ED   ____________________________________________  EKG  G read interpreted by me shows sinus tachycardia rate of 124 right axis nonspecific ST-T wave changes ____________________________________________  RADIOLOGY  ED MD interpretation:  ultrasound was done and is read as negative  Official radiology report(s): US Abdomen Complete  Result Date: 07/08/2017 CLINICAL DATA:  Upper abdominal pain in a [redacted] week pregnant patient. History of endometriosis. EXAM: ABDOMEN ULTRASOUND COMPLETE COMPARISON:  Abdominal and pelvic CT scan of July 19, 2013 FINDINGS: Gallbladder: No gallstones or wall thickening visualized. No sonographic Murphy sign noted by sonographer. Common bile duct: Diameter: 1.7 mm Liver: No focal lesion identified. Within normal limits in parenchymal echogenicity. Portal vein is patent on color Doppler imaging with normal direction of blood flow towards the liver. IVC: No abnormality visualized. Pancreas: Visualized portion unremarkable. Spleen: Size and appearance within normal limits. Right Kidney: Length: 10.8 cm. Echogenicity within normal limits. No mass or hydronephrosis visualized. Left Kidney: Length: 10.2 cm. Echogenicity within normal limits. No mass or hydronephrosis visualized. Abdominal aorta: No aneurysm visualized. Other findings: There is no ascites. IMPRESSION: Normal abdominal ultrasound. No gallstones, evidence of cholecystitis, nor other acute intra-abdominal abnormality. Electronically Signed   By: David  [place] M.D.   On: 07/08/2017 16:39    ____________________________________________   PROCEDURES  Procedure(s) performed:  Procedures  Critical Care performed:   ____________________________________________   INITIAL IMPRESSION / ASSESSMENT AND PLAN / ED COURSE discussed the patient with Dr. Elza Rafter. The patient actually looks better now. She's  not had any vomiting or diarrhea since she's been  In the emergency room. Pain is somewhat better as well. I will discharge her. She will return here for worse pain fever or vomiting and will follow-up with Dr. Bridget Hartshorn office in the morning.      Clinical Course as of Jul 08 1725  Tue Jul 08, 2017  1149 BUN: 7 [PM]    Clinical Course User Index [PM] Arnaldo Natal, MD     ____________________________________________   FINAL CLINICAL IMPRESSION(S) / ED DIAGNOSES  Final diagnoses:  Nausea vomiting and diarrhea  Abdominal pain, unspecified abdominal location     ED Discharge Orders        Ordered    HYDROcodone-acetaminophen  (NORCO) 5-325 MG tablet  Every 4 hours PRN     07/08/17 1726       Note:  This document was prepared using Dragon voice recognition software and may include unintentional dictation errors.    Arnaldo Natal, MD 07/08/17 1727    Arnaldo Natal, MD 07/21/17 [phone]

## 2017-07-08 NOTE — Discharge Instructions (Addendum)
please follow-up with Dr. Greggory Keenefrancesco in the morning as planned. please return for worse pain fever or vomiting. For tonight you can take hydrocodone one pill up to 4 times a day. be careful it canmake you sleepy and constipated but I will only give you 2 pills so you should be okay. 2 pills should not cause any problems in pregnancy either.

## 2017-07-08 NOTE — ED Notes (Signed)
Went to D/C pt and she was dizzy, and was seeing stars. Dr. Darnelle CatalanMalinda notified. New orders given.

## 2017-07-09 ENCOUNTER — Ambulatory Visit: Payer: BLUE CROSS/BLUE SHIELD | Admitting: Obstetrics and Gynecology

## 2017-07-09 ENCOUNTER — Encounter: Payer: Self-pay | Admitting: Obstetrics and Gynecology

## 2017-07-09 VITALS — BP 123/79 | HR 93 | Ht 64.0 in | Wt 195.2 lb

## 2017-07-09 DIAGNOSIS — Z6833 Body mass index (BMI) 33.0-33.9, adult: Secondary | ICD-10-CM | POA: Diagnosis not present

## 2017-07-09 DIAGNOSIS — Z8669 Personal history of other diseases of the nervous system and sense organs: Secondary | ICD-10-CM

## 2017-07-09 DIAGNOSIS — N809 Endometriosis, unspecified: Secondary | ICD-10-CM | POA: Diagnosis not present

## 2017-07-09 DIAGNOSIS — Z3201 Encounter for pregnancy test, result positive: Secondary | ICD-10-CM

## 2017-07-09 NOTE — Patient Instructions (Addendum)
1.  Return in 4 weeks for nursing OB intake 2.  Return in 6 weeks for OB history and physical with physician  Common Medications Safe in Pregnancy  Acne:      Constipation:  Benzoyl Peroxide     Colace  Clindamycin      Dulcolax Suppository  Topica Erythromycin     Fibercon  Salicylic Acid      Metamucil         Miralax AVOID:        Senakot   Accutane    Cough:  Retin-A       Cough Drops  Tetracycline      Phenergan w/ Codeine if Rx  Minocycline      Robitussin (Plain & DM)  Antibiotics:     Crabs/Lice:  Ceclor       RID  Cephalosporins    AVOID:  E-Mycins      Kwell  Keflex  Macrobid/Macrodantin   Diarrhea:  Penicillin      Kao-Pectate  Zithromax      Imodium AD         PUSH FLUIDS AVOID:       Cipro     Fever:  Tetracycline      Tylenol (Regular or Extra  Minocycline       Strength)  Levaquin      Extra Strength-Do not          Exceed 8 tabs/24 hrs Caffeine:        <257m/day (equiv. To 1 cup of coffee or  approx. 3 12 oz sodas)         Gas: Cold/Hayfever:       Gas-X  Benadryl      Mylicon  Claritin       Phazyme  **Claritin-D        Chlor-Trimeton    Headaches:  Dimetapp      ASA-Free Excedrin  Drixoral-Non-Drowsy     Cold Compress  Mucinex (Guaifenasin)     Tylenol (Regular or Extra  Sudafed/Sudafed-12 Hour     Strength)  **Sudafed PE Pseudoephedrine   Tylenol Cold & Sinus     Vicks Vapor Rub  Zyrtec  **AVOID if Problems With Blood Pressure         Heartburn: Avoid lying down for at least 1 hour after meals  Aciphex      Maalox     Rash:  Milk of Magnesia     Benadryl    Mylanta       1% Hydrocortisone Cream  Pepcid  Pepcid Complete   Sleep Aids:  Prevacid      Ambien   Prilosec       Benadryl  Rolaids       Chamomile Tea  Tums (Limit 4/day)     Unisom  Zantac       Tylenol PM         Warm milk-add vanilla or  Hemorrhoids:       Sugar for taste  Anusol/Anusol H.C.  (RX: Analapram 2.5%)  Sugar Substitutes:  Hydrocortisone OTC     Ok in  moderation  Preparation H      Tucks        Vaseline lotion applied to tissue with wiping    Herpes:     Throat:  Acyclovir      Oragel  Famvir  Valtrex     Vaccines:         Flu Shot Leg Cramps:       *  Gardasil  Benadryl      Hepatitis A         Hepatitis B Nasal Spray:       Pneumovax  Saline Nasal Spray     Polio Booster         Tetanus Nausea:       Tuberculosis test or PPD  Vitamin B6 25 mg TID   AVOID:    Dramamine      *Gardasil  Emetrol       Live Poliovirus  Ginger Root 250 mg QID    MMR (measles, mumps &  High Complex Carbs @ Bedtime    rebella)  Sea Bands-Accupressure    Varicella (Chickenpox)  Unisom 1/2 tab TID     *No known complications           If received before Pain:         Known pregnancy;   Darvocet       Resume series after  Lortab        Delivery  Percocet    Yeast:   Tramadol      Femstat  Tylenol 3      Gyne-lotrimin  Ultram       Monistat  Vicodin           MISC:         All Sunscreens           Hair Coloring/highlights          Insect Repellant's          (Including DEET)         Mystic Tans Morning Sickness Morning sickness is when you feel sick to your stomach (nauseous) during pregnancy. You may feel sick to your stomach and throw up (vomit). You may feel sick in the morning, but you can feel this way any time of day. Some women feel very sick to their stomach and cannot stop throwing up (hyperemesis gravidarum). Follow these instructions at home:  Only take medicines as told by your doctor.  Take multivitamins as told by your doctor. Taking multivitamins before getting pregnant can stop or lessen the harshness of morning sickness.  Eat dry toast or unsalted crackers before getting out of bed.  Eat 5 to 6 small meals a day.  Eat dry and bland foods like rice and baked potatoes.  Do not drink liquids with meals. Drink between meals.  Do not eat greasy, fatty, or spicy foods.  Have someone cook for you if the smell of food causes  you to feel sick or throw up.  If you feel sick to your stomach after taking prenatal vitamins, take them at night or with a snack.  Eat protein when you need a snack (nuts, yogurt, cheese).  Eat unsweetened gelatins for dessert.  Wear a bracelet used for sea sickness (acupressure wristband).  Go to a doctor that puts thin needles into certain body points (acupuncture) to improve how you feel.  Do not smoke.  Use a humidifier to keep the air in your house free of odors.  Get lots of fresh air. Contact a doctor if:  You need medicine to feel better.  You feel dizzy or lightheaded.  You are losing weight. Get help right away if:  You feel very sick to your stomach and cannot stop throwing up.  You pass out (faint). This information is not intended to replace advice given to you by your health care provider. Make sure you discuss any questions you  have with your health care provider. Document Released: 04/25/2004 Document Revised: 08/24/2015 Document Reviewed: 09/02/2012 Elsevier Interactive Patient Education  2017 [employer].

## 2017-07-09 NOTE — Progress Notes (Signed)
GYN ENCOUNTER NOTE  Subjective:       Stephanie Hampton is a 22 y.o. 342P1001 female is here for gynecologic evaluation of the following issues:  1.  Pregnancy confirmation 2.  Amenorrhea  LMP 06/07/17 (shoulder) EDD 03/14/2018 EGA 4.[redacted] weeks gestation  Patient reports mild breast tenderness.  No significant nausea with vomiting.  Mild pelvic cramping, central has been noted.  No vaginal bleeding.  Prenatal risk factors: 1.  Increased BMI 33.5 2.  History of endometriosis 3.  History of migraine headaches; [facility]   Obstetric History OB History  Gravida Para Term Preterm AB Living  2 1 1     1   SAB TAB Ectopic Multiple Live Births          1    # Outcome Date GA Lbr Len/2nd Weight Sex Delivery Anes PTL Lv  2 Current           1 Term 2014   7 lb 4.8 oz (3.311 kg) F Vag-Spont   LIV    Past Medical History:  Diagnosis Date  . Depression   . Endometriosis   . Ovarian cyst     Past Surgical History:  Procedure Laterality Date  . LAPAROSCOPIC OVARIAN CYSTECTOMY Left 01/22/2016   Procedure: LAPAROSCOPIC LEFT OVARIAN CYSTECTOMY-POSSIBLE BIOPSIES;  Surgeon: Herold HarmsMartin A Lillian Ballester, MD;  Location: [facility] ORS;  Service: Gynecology;  Laterality: Left;  excision/fulgeration of endometriosis  . NO PAST SURGERIES      Current Outpatient Medications on File Prior to Visit  Medication Sig Dispense Refill  . Prenatal Vit-Fe Fumarate-FA (PRENATAL PO) Take 1 tablet by mouth every evening.     No current facility-administered medications on file prior to visit.     Allergies  Allergen Reactions  . Augmentin [Amoxicillin-Pot Clavulanate] Hives    Social History   Socioeconomic History  . Marital status: Married    Spouse name: Not on file  . Number of children: Not on file  . Years of education: Not on file  . Highest education level: Not on file  Occupational History  . Not on file  Social Needs  . Financial resource strain: Not on file  . Food insecurity:     Worry: Not on file    Inability: Not on file  . Transportation needs:    Medical: Not on file    Non-medical: Not on file  Tobacco Use  . Smoking status: Never Smoker  . Smokeless tobacco: Never Used  Substance and Sexual Activity  . Alcohol use: No    Alcohol/week: 0.0 oz  . Drug use: No  . Sexual activity: Yes    Birth control/protection: None  Lifestyle  . Physical activity:    Days per week: Not on file    Minutes per session: Not on file  . Stress: Not on file  Relationships  . Social connections:    Talks on phone: Not on file    Gets together: Not on file    Attends religious service: Not on file    Active member of club or organization: Not on file    Attends meetings of clubs or organizations: Not on file    Relationship status: Not on file  . Intimate partner violence:    Fear of current or ex partner: Not on file    Emotionally abused: Not on file    Physically abused: Not on file    Forced sexual activity: Not on file  Other Topics Concern  .  Not on file  Social History Narrative  . Not on file    Family History  Problem Relation Age of Onset  . Heart disease Father   . Cancer Neg Hx   . Ovarian cancer Neg Hx   . Colon cancer Neg Hx   . Diabetes Neg Hx     The following portions of the patient's history were reviewed and updated as appropriate: allergies, current medications, past family history, past medical history, past social history, past surgical history and problem list.  Review of Systems Review of Systems -comprehensive review of systems is negative except for that noted in the HPI  Objective:   BP 123/79   Pulse 93   Ht 5\' 4"  (1.626 m)   Wt 195 lb 3.2 oz (88.5 kg)   LMP 06/07/2017 (Exact Date)   BMI 33.51 kg/m  Physical exam-deferred    Assessment:   1. Positive urine pregnancy test  2. Endometriosis determined by laparoscopy  3. Hx of migraine headaches  4.  BMI 33.5     Plan:   1.  UPT-positive 2.  Patient is  to call if nausea and vomiting symptoms worsen in order to have directly just prescribed 3.  Early Glucola is recommended 4.New OB counseling: The patient has been given an overview regarding routine prenatal care. Recommendations regarding diet, weight gain, and exercise in pregnancy were given. Prenatal testing, optional genetic testing, and ultrasound use in pregnancy were reviewed.  Benefits of Breast Feeding were discussed. The patient is encouraged to consider nursing her baby post partum.  A total of 25 minutes were spent face-to-face with the patient during this encounter and over half of that time involved counseling and coordination of care.  Herold Harms, MD  Note: This dictation was prepared with Dragon dictation along with smaller phrase technology. Any transcriptional errors that result from this process are unintentional.

## 2017-07-18 ENCOUNTER — Encounter: Payer: Self-pay | Admitting: Obstetrics and Gynecology

## 2017-07-21 ENCOUNTER — Other Ambulatory Visit: Payer: Self-pay

## 2017-07-21 MED ORDER — ONDANSETRON 4 MG PO TBDP: 4.0000 mg | ORAL_TABLET | Freq: Three times a day (TID) | ORAL | 0 refills | Status: DC | PRN

## 2017-07-24 ENCOUNTER — Encounter: Payer: Self-pay | Admitting: Obstetrics and Gynecology

## 2017-07-24 ENCOUNTER — Other Ambulatory Visit: Payer: Self-pay

## 2017-08-04 ENCOUNTER — Other Ambulatory Visit: Payer: Self-pay | Admitting: Obstetrics and Gynecology

## 2017-08-04 MED ORDER — ONDANSETRON 4 MG PO TBDP: 4.0000 mg | ORAL_TABLET | Freq: Three times a day (TID) | ORAL | 0 refills | Status: DC | PRN

## 2017-08-05 ENCOUNTER — Telehealth: Payer: Self-pay | Admitting: Obstetrics and Gynecology

## 2017-08-05 ENCOUNTER — Encounter: Payer: Self-pay | Admitting: Obstetrics and Gynecology

## 2017-08-05 ENCOUNTER — Other Ambulatory Visit: Payer: Self-pay

## 2017-08-05 MED ORDER — PROMETHAZINE HCL 25 MG RE SUPP: 25.0000 mg | Freq: Four times a day (QID) | RECTAL | 0 refills | Status: DC | PRN

## 2017-08-05 NOTE — Telephone Encounter (Signed)
The patients husband called and stated that he/she would like to speak with Dr. Tommi Rumps or his nurse in regards to the patients blood pressure being very low and they have concerns. Please advise.

## 2017-08-06 ENCOUNTER — Encounter: Payer: Self-pay | Admitting: Obstetrics and Gynecology

## 2017-08-06 ENCOUNTER — Telehealth: Payer: Self-pay

## 2017-08-06 NOTE — Telephone Encounter (Signed)
Patient called  and states she would like to talk to a nurse. Patient is requesting a call back. Her contact # is [phone] . Please advise. Thank you

## 2017-08-06 NOTE — Telephone Encounter (Signed)
Pt states she is taking phenergan suppositories as rxed. She was able to hold several sips of h20 this morning but around lunch she vomited x2. She has not had anything to eat. She is still at work. She is having a d/c that is red/brown. NO new detergents or bodywash. Pt states her last pregnancy she was given  of zofran which helped. Advised pt to take phenergan as rxed. Sip on h20, ice chips, chicken broth, or ginger ale. Appt made for tomorrow at 10:45.

## 2017-08-06 NOTE — Telephone Encounter (Signed)
Pt was able to use the rectal suppository last nite. It did help with the nausea. She states she is also constipated. It did help her have a BM. She is going to work today. Advised pt to stay hydrated. F/u via my chart or telephone call. If pt is unable to hold liquids this am she will need to be seen. Pt voices understanding.

## 2017-08-07 ENCOUNTER — Other Ambulatory Visit: Payer: Self-pay | Admitting: Obstetrics and Gynecology

## 2017-08-07 ENCOUNTER — Encounter: Payer: Self-pay | Admitting: Obstetrics and Gynecology

## 2017-08-07 ENCOUNTER — Ambulatory Visit (INDEPENDENT_AMBULATORY_CARE_PROVIDER_SITE_OTHER): Payer: BLUE CROSS/BLUE SHIELD | Admitting: Obstetrics and Gynecology

## 2017-08-07 VITALS — BP 117/76 | HR 88 | Ht 64.0 in | Wt 189.6 lb

## 2017-08-07 DIAGNOSIS — O219 Vomiting of pregnancy, unspecified: Secondary | ICD-10-CM

## 2017-08-07 DIAGNOSIS — O210 Mild hyperemesis gravidarum: Secondary | ICD-10-CM | POA: Diagnosis not present

## 2017-08-07 DIAGNOSIS — N926 Irregular menstruation, unspecified: Secondary | ICD-10-CM

## 2017-08-07 DIAGNOSIS — Z3201 Encounter for pregnancy test, result positive: Secondary | ICD-10-CM

## 2017-08-07 LAB — POCT URINALYSIS DIPSTICK
Bilirubin, UA: NEGATIVE
Glucose, UA: NEGATIVE
NITRITE UA: NEGATIVE
Odor: NEGATIVE
PROTEIN UA: NEGATIVE
Spec Grav, UA: 1.02 (ref 1.010–1.025)
Urobilinogen, UA: 0.2 E.U./dL
pH, UA: 6 (ref 5.0–8.0)

## 2017-08-07 MED ORDER — ONDANSETRON 4 MG PO TBDP: 8.0000 mg | ORAL_TABLET | Freq: Three times a day (TID) | ORAL | 1 refills | Status: DC | PRN

## 2017-08-07 NOTE — Progress Notes (Signed)
HPI:      [name] is a 22 y.o. G2P1001 who LMP was Patient's last menstrual period was 06/07/2017 (exact date).  Subjective:   She presents today complaining of nausea and vomiting of pregnancy since Sunday.  Patient not keeping liquids or food down.  Has tried likely just, Phenergan and Zofran without success.  She says this happened with her last pregnancy as well and only 8 mg of Zofran worked.      Hx: The following portions of the patient's history were reviewed and updated as appropriate:             She  has a past medical history of Depression, Endometriosis, and Ovarian cyst. She does not have any pertinent problems on file. She  has a past surgical history that includes No past surgeries and Laparoscopic ovarian cystectomy (Left, 01/22/2016). Her family history includes Heart disease in her father. She  reports that she has never smoked. She has never used smokeless tobacco. She reports that she does not drink alcohol or use drugs. She has a current medication list which includes the following prescription(s): ondansetron, prenatal vit-fe fumarate-fa, and promethazine. She is allergic to augmentin [amoxicillin-pot clavulanate].       Review of Systems:  Review of Systems  Constitutional: Denied constitutional symptoms, night sweats, recent illness, fatigue, fever, insomnia and weight loss.  Eyes: Denied eye symptoms, eye pain, photophobia, vision change and visual disturbance.  Ears/Nose/Throat/Neck: Denied ear, nose, throat or neck symptoms, hearing loss, nasal discharge, sinus congestion and sore throat.  Cardiovascular: Denied cardiovascular symptoms, arrhythmia, chest pain/pressure, edema, exercise intolerance, orthopnea and palpitations.  Respiratory: Denied pulmonary symptoms, asthma, pleuritic pain, productive sputum, cough, dyspnea and wheezing.  Gastrointestinal: See HPI for additional information.  Genitourinary: Denied genitourinary symptoms including  symptomatic vaginal discharge, pelvic relaxation issues, and urinary complaints.  Musculoskeletal: Denied musculoskeletal symptoms, stiffness, swelling, muscle weakness and myalgia.  Dermatologic: Denied dermatology symptoms, rash and scar.  Neurologic: Denied neurology symptoms, dizziness, headache, neck pain and syncope.  Psychiatric: Denied psychiatric symptoms, anxiety and depression.  Endocrine: Denied endocrine symptoms including hot flashes and night sweats.   Meds:   Current Outpatient Medications on File Prior to Visit  Medication Sig Dispense Refill  . Prenatal Vit-Fe Fumarate-FA (PRENATAL PO) Take 1 tablet by mouth every evening.    . promethazine (PHENERGAN) 25 MG suppository Place 1 suppository (25 mg total) rectally every 6 (six) hours as needed for nausea or vomiting. 12 each 0   No current facility-administered medications on file prior to visit.     Objective:     Vitals:   08/07/17 1056  BP: 117/76  Pulse: 88               Large ketones  6 pound weight loss in 1 month  Assessment:    G2P1001 Patient Active Problem List   Diagnosis Date Noted  . Positive urine pregnancy test 07/09/2017  . BMI 33.0-33.9,adult 07/09/2017  . Hx of migraine headaches 07/09/2017  . Endometriosis determined by laparoscopy 01/30/2016  . Pelvic pain 01/17/2016     1. Nausea and vomiting during pregnancy   2. Hyperemesis affecting pregnancy, antepartum     Patient significantly dehydrated and not doing well in the first trimester pregnancy.   Plan:            1.  We discussed admission to the hospital and patient declined this today.  I have asked her to contact the office tomorrow and if  she is no better will admit her to the hospital for IV fluids, antiemetics and lab work.   2.  Patient to schedule dating and viability ultrasound with her nurse visit on Monday if she is doing better.  Orders Orders Placed This Encounter  Procedures  . POCT urinalysis dipstick      Meds ordered this encounter  Medications  . ondansetron (ZOFRAN ODT) 4 MG disintegrating tablet    Sig: Take 2 tablets (8 mg total) by mouth every 8 (eight) hours as needed for nausea or vomiting.    Dispense:  40 tablet    Refill:  1      F/U  Return if symptoms worsen or fail to improve call tomorrow morning. I spent 18 minutes with this patient of which greater than 50% was spent discussing nausea vomiting of pregnancy, strategies for hydration, medications, hospital admission, future follow-ups.  Elonda Husky, M.D. 08/07/2017 11:30 AM

## 2017-08-08 ENCOUNTER — Observation Stay
Admission: AD | Admit: 2017-08-08 | Discharge: 2017-08-11 | Disposition: A | Discharge status: Home / Self Care | Payer: BLUE CROSS/BLUE SHIELD | Source: Ambulatory Visit | Attending: Obstetrics and Gynecology | Admitting: Obstetrics and Gynecology

## 2017-08-08 ENCOUNTER — Other Ambulatory Visit: Payer: Self-pay

## 2017-08-08 ENCOUNTER — Observation Stay: Payer: BLUE CROSS/BLUE SHIELD

## 2017-08-08 DIAGNOSIS — O210 Mild hyperemesis gravidarum: Secondary | ICD-10-CM | POA: Diagnosis present

## 2017-08-08 DIAGNOSIS — O26841 Uterine size-date discrepancy, first trimester: Secondary | ICD-10-CM

## 2017-08-08 DIAGNOSIS — Z3A09 9 weeks gestation of pregnancy: Secondary | ICD-10-CM | POA: Diagnosis not present

## 2017-08-08 DIAGNOSIS — O99341 Other mental disorders complicating pregnancy, first trimester: Secondary | ICD-10-CM | POA: Insufficient documentation

## 2017-08-08 DIAGNOSIS — O211 Hyperemesis gravidarum with metabolic disturbance: Secondary | ICD-10-CM | POA: Diagnosis not present

## 2017-08-08 DIAGNOSIS — Z3A08 8 weeks gestation of pregnancy: Secondary | ICD-10-CM | POA: Diagnosis not present

## 2017-08-08 DIAGNOSIS — Z79899 Other long term (current) drug therapy: Secondary | ICD-10-CM | POA: Insufficient documentation

## 2017-08-08 DIAGNOSIS — Z880 Allergy status to penicillin: Secondary | ICD-10-CM | POA: Insufficient documentation

## 2017-08-08 DIAGNOSIS — F329 Major depressive disorder, single episode, unspecified: Secondary | ICD-10-CM | POA: Insufficient documentation

## 2017-08-08 LAB — URINALYSIS, ROUTINE W REFLEX MICROSCOPIC
Bacteria, UA: NONE SEEN
Bilirubin Urine: NEGATIVE
GLUCOSE, UA: NEGATIVE mg/dL
KETONES UR: 80 mg/dL — AB
Leukocytes, UA: NEGATIVE
NITRITE: NEGATIVE
PROTEIN: 30 mg/dL — AB
Specific Gravity, Urine: 1.027 (ref 1.005–1.030)
pH: 5 (ref 5.0–8.0)

## 2017-08-08 LAB — CBC
HCT: 43.1 % (ref 35.0–47.0)
HEMOGLOBIN: 14.9 g/dL (ref 12.0–16.0)
MCH: 30.4 pg (ref 26.0–34.0)
MCHC: 34.5 g/dL (ref 32.0–36.0)
MCV: 87.9 fL (ref 80.0–100.0)
Platelets: 218 10*3/uL (ref 150–440)
RBC: 4.9 MIL/uL (ref 3.80–5.20)
RDW: 13.4 % (ref 11.5–14.5)
WBC: 10.1 10*3/uL (ref 3.6–11.0)

## 2017-08-08 LAB — TSH: TSH: 0.827 u[IU]/mL (ref 0.350–4.500)

## 2017-08-08 LAB — HCG, QUANTITATIVE, PREGNANCY: hCG, Beta Chain, Quant, S: 201230 m[IU]/mL — ABNORMAL HIGH (ref <5)

## 2017-08-08 MED ORDER — ZOLPIDEM TARTRATE 5 MG PO TABS: 5.0000 mg | ORAL_TABLET | Freq: Every evening | ORAL | Status: DC | PRN

## 2017-08-08 MED ORDER — CALCIUM CARBONATE ANTACID 500 MG PO CHEW: 2.0000 | CHEWABLE_TABLET | ORAL | Status: DC | PRN

## 2017-08-08 MED ORDER — METOCLOPRAMIDE HCL 5 MG/ML IJ SOLN
10.0000 mg | Freq: Four times a day (QID) | INTRAMUSCULAR | Status: AC
  Administered 2017-08-08 – 2017-08-09 (×4): 10 mg via INTRAVENOUS
  Filled 2017-08-08 (×4): qty 2

## 2017-08-08 MED ORDER — DEXTROSE IN LACTATED RINGERS 5 % IV SOLN: INTRAVENOUS | Status: DC

## 2017-08-08 MED ORDER — PRENATAL MULTIVITAMIN CH
1.0000 | ORAL_TABLET | Freq: Every day | ORAL | Status: DC
  Administered 2017-08-10 – 2017-08-11 (×2): 1 via ORAL
  Filled 2017-08-08 (×3): qty 1

## 2017-08-08 MED ORDER — ACETAMINOPHEN 325 MG PO TABS
650.0000 mg | ORAL_TABLET | ORAL | Status: DC | PRN
  Administered 2017-08-11: 650 mg via ORAL
  Filled 2017-08-08: qty 2

## 2017-08-08 MED ORDER — DEXTROSE 5 % IN LACTATED RINGERS IV BOLUS
1000.0000 mL | INTRAVENOUS | Status: AC
  Administered 2017-08-08 (×2): 1000 mL via INTRAVENOUS

## 2017-08-08 MED ORDER — KCL-LACTATED RINGERS-D5W 20 MEQ/L IV SOLN
INTRAVENOUS | Status: DC
  Administered 2017-08-08 – 2017-08-09 (×3): via INTRAVENOUS
  Filled 2017-08-08 (×13): qty 1000

## 2017-08-08 NOTE — Addendum Note (Signed)
Addended by: Elonda Husky on: 08/08/2017 09:24 AM   Modules accepted: Orders, SmartSet

## 2017-08-09 DIAGNOSIS — O211 Hyperemesis gravidarum with metabolic disturbance: Secondary | ICD-10-CM | POA: Diagnosis not present

## 2017-08-09 DIAGNOSIS — F329 Major depressive disorder, single episode, unspecified: Secondary | ICD-10-CM | POA: Diagnosis not present

## 2017-08-09 DIAGNOSIS — Z3A09 9 weeks gestation of pregnancy: Secondary | ICD-10-CM | POA: Diagnosis not present

## 2017-08-09 DIAGNOSIS — Z880 Allergy status to penicillin: Secondary | ICD-10-CM | POA: Diagnosis not present

## 2017-08-09 DIAGNOSIS — O99341 Other mental disorders complicating pregnancy, first trimester: Secondary | ICD-10-CM | POA: Diagnosis not present

## 2017-08-09 DIAGNOSIS — Z79899 Other long term (current) drug therapy: Secondary | ICD-10-CM | POA: Diagnosis not present

## 2017-08-09 MED ORDER — DEXTROSE IN LACTATED RINGERS 5 % IV SOLN
INTRAVENOUS | Status: DC
  Administered 2017-08-09 – 2017-08-10 (×4): via INTRAVENOUS

## 2017-08-09 MED ORDER — ONDANSETRON HCL 4 MG/2ML IJ SOLN
4.0000 mg | INTRAMUSCULAR | Status: DC | PRN
  Administered 2017-08-09 – 2017-08-10 (×2): 4 mg via INTRAVENOUS
  Filled 2017-08-09 (×2): qty 2

## 2017-08-09 MED ORDER — METOCLOPRAMIDE HCL 5 MG/ML IJ SOLN
10.0000 mg | Freq: Four times a day (QID) | INTRAMUSCULAR | Status: DC
  Administered 2017-08-09 (×2): 10 mg via INTRAVENOUS
  Filled 2017-08-09 (×2): qty 2

## 2017-08-09 NOTE — Progress Notes (Signed)
Patient ID: Stephanie Hampton, female   DOB: [birth year], 22 y.o.   MRN: [id number]     Subjective:    Feeling better.  Some nausea, but no vomiting.  Taking po liq - some broth this AM.  Objective:    Patient Vitals for the past 2 hrs:  BP Temp Temp src Pulse Resp SpO2  08/09/17 0818 (!) 118/48 (!) 97.4 F (36.3 C) Oral 69 16 100 %   Total I/O In: -  Out: 750 [Urine:750]  Labs: Results for orders placed or performed during the hospital encounter of 08/08/17 (from the past 24 hour(s))  Urinalysis, Routine w reflex microscopic     Status: Abnormal   Collection Time: 08/08/17 11:51 AM  Result Value Ref Range   Color, Urine AMBER (A) YELLOW   APPearance HAZY (A) CLEAR   Specific Gravity, Urine 1.027 1.005 - 1.030   pH 5.0 5.0 - 8.0   Glucose, UA NEGATIVE NEGATIVE mg/dL   Hgb urine dipstick SMALL (A) NEGATIVE   Bilirubin Urine NEGATIVE NEGATIVE   Ketones, ur 80 (A) NEGATIVE mg/dL   Protein, ur 30 (A) NEGATIVE mg/dL   Nitrite NEGATIVE NEGATIVE   Leukocytes, UA NEGATIVE NEGATIVE   RBC / HPF 11-20 0 - 5 RBC/hpf   WBC, UA 0-5 0 - 5 WBC/hpf   Bacteria, UA NONE SEEN NONE SEEN   Squamous Epithelial / LPF 0-5 0 - 5   Mucus PRESENT   CBC on admission     Status: None   Collection Time: 08/08/17 12:30 PM  Result Value Ref Range   WBC 10.1 3.6 - 11.0 K/uL   RBC 4.90 3.80 - 5.20 MIL/uL   Hemoglobin 14.9 12.0 - 16.0 g/dL   HCT 40.9 81.1 - 91.4 %   MCV 87.9 80.0 - 100.0 fL   MCH 30.4 26.0 - 34.0 pg   MCHC 34.5 32.0 - 36.0 g/dL   RDW 78.2 95.6 - 21.3 %   Platelets 218 150 - 440 K/uL  hCG, quantitative, pregnancy     Status: Abnormal   Collection Time: 08/08/17 12:30 PM  Result Value Ref Range   hCG, Beta Chain, Quant, S 201,230 (H) <5 mIU/mL  TSH     Status: None   Collection Time: 08/08/17 12:30 PM  Result Value Ref Range   TSH 0.827 0.350 - 4.500 uIU/mL    Medications    Current Discharge Medication List    CONTINUE these medications which have NOT CHANGED   Details    ondansetron (ZOFRAN ODT) 4 MG disintegrating tablet Take 2 tablets (8 mg total) by mouth every 8 (eight) hours as needed for nausea or vomiting. Qty: 40 tablet, Refills: 1    Prenatal Vit-Fe Fumarate-FA (PRENATAL PO) Take 1 tablet by mouth every evening.    promethazine (PHENERGAN) 25 MG suppository Place 1 suppository (25 mg total) rectally every 6 (six) hours as needed for nausea or vomiting. Qty: 12 each, Refills: 0          Assessment:    1.  N/V improved  2.  U/S = singleton IUP   Plan:    Continue Reglan and slowly increase PO.  Change to PO Reglan when pt taking liq without problem.  Elonda Husky, M.D. 08/09/2017 10:02 AM

## 2017-08-09 NOTE — Plan of Care (Signed)
Vs stable; up ad lib; urine is better in color (not as dark amber as beginning of this shift); taking scheduled reglan for nausea; no vomiting this shift; tolerating clear liquids

## 2017-08-09 NOTE — Progress Notes (Signed)
Patient vitals stable today. Blood pressures have been slightly low but patient has been asymptomatic. Patient was able to tolerating some chicken broth for breakfast with no nausea. Patient stated they took 3-4 bites of bread dipped in soup for lunch but then got nauseous afterward and couldn't eat anymore. Patient stated around lunch time that her nausea comes in waves. Patient also states that she ordered dinner but then got nauseous so she has not eaten dinner as of yet. Patient has not been tolerating much PO fluids. Patient voiding appropriately. No vommitting on this shift. Patient told RN that she feels as if the reglan is not lasting 6 hours like it had been previously and that she gets nauseous before it's time for her next dose. RN updated provider, Dr. Logan Bores about patient status. RN to order Zofran ( ), stop reglan, and decrease fluid rate from 200 to 158ml/hr, per Dr. Logan Bores. RN to update patient.

## 2017-08-10 DIAGNOSIS — Z79899 Other long term (current) drug therapy: Secondary | ICD-10-CM | POA: Diagnosis not present

## 2017-08-10 DIAGNOSIS — O211 Hyperemesis gravidarum with metabolic disturbance: Secondary | ICD-10-CM | POA: Diagnosis not present

## 2017-08-10 DIAGNOSIS — O99341 Other mental disorders complicating pregnancy, first trimester: Secondary | ICD-10-CM | POA: Diagnosis not present

## 2017-08-10 DIAGNOSIS — F329 Major depressive disorder, single episode, unspecified: Secondary | ICD-10-CM | POA: Diagnosis not present

## 2017-08-10 DIAGNOSIS — Z3A09 9 weeks gestation of pregnancy: Secondary | ICD-10-CM | POA: Diagnosis not present

## 2017-08-10 DIAGNOSIS — Z880 Allergy status to penicillin: Secondary | ICD-10-CM | POA: Diagnosis not present

## 2017-08-10 MED ORDER — ONDANSETRON 4 MG PO TBDP
8.0000 mg | ORAL_TABLET | Freq: Three times a day (TID) | ORAL | Status: DC | PRN
  Administered 2017-08-10 – 2017-08-11 (×3): 8 mg via ORAL
  Filled 2017-08-10 (×3): qty 2

## 2017-08-10 NOTE — H&P (Signed)
ADMIT NOTE  HPI:      Stephanie Hampton is a 22 y.o. G2P1001 who LMP was Patient's last menstrual period was 06/07/2017 (exact date).  Subjective: She presented with c/o persistent N/V.  Tried multiple medications without success.          HISTORY Allergies  Allergen Reactions  . Augmentin [Amoxicillin-Pot Clavulanate] Hives    OB History  OB History  Gravida Para Term Preterm AB Living  SAB TAB Ectopic Multiple Live Births          1    # Outcome Date GA Lbr Len/2nd Weight Sex Delivery Anes PTL Lv  2 Current           1 Term 2014   7 lb 4.8 oz (3.311 kg) F Vag-Spont   LIV    Past Medical History  Past Medical History:  Diagnosis Date  . Depression   . Endometriosis   . Ovarian cyst     Past Surgical History  Past Surgical History:  Procedure Laterality Date  . LAPAROSCOPIC OVARIAN CYSTECTOMY Left 01/22/2016   Procedure: LAPAROSCOPIC LEFT OVARIAN CYSTECTOMY-POSSIBLE BIOPSIES;  Surgeon: Herold Harms, MD;  Location: [facility] ORS;  Service: Gynecology;  Laterality: Left;  excision/fulgeration of endometriosis  . NO PAST SURGERIES        Past Social History:  Social History   Socioeconomic History  . Marital status: Married    Spouse name: Not on file  . Number of children: Not on file  . Years of education: Not on file  . Highest education level: Not on file  Occupational History  . Not on file  Social Needs  . Financial resource strain: Not on file  . Food insecurity:    Worry: Not on file    Inability: Not on file  . Transportation needs:    Medical: Not on file    Non-medical: Not on file  Tobacco Use  . Smoking status: Never Smoker  . Smokeless tobacco: Never Used  Substance and Sexual Activity  . Alcohol use: No    Alcohol/week: 0.0 oz  . Drug use: No  . Sexual activity: Yes    Birth control/protection: None  Lifestyle  . Physical activity:    Days per week: Not on file    Minutes per session: Not on file  .  Stress: Not on file  Relationships  . Social connections:    Talks on phone: Not on file    Gets together: Not on file    Attends religious service: Not on file    Active member of club or organization: Not on file    Attends meetings of clubs or organizations: Not on file    Relationship status: Not on file  Other Topics Concern  . Not on file  Social History Narrative  . Not on file    Family History  Family History  Problem Relation Age of Onset  . Heart disease Father   . Cancer Neg Hx   . Ovarian cancer Neg Hx   . Colon cancer Neg Hx   . Diabetes Neg Hx      ROS: Constitutional: Denied constitutional symptoms, night sweats, recent illness, fatigue, fever, insomnia and weight loss.  Eyes: Denied eye symptoms, eye pain, photophobia, vision change and visual disturbance.  Ears/Nose/Throat/Neck: Denied ear, nose, throat or neck symptoms, hearing loss, nasal discharge, sinus congestion and sore throat.  Cardiovascular: Denied cardiovascular symptoms, arrhythmia, chest pain/pressure, edema, exercise intolerance, orthopnea and palpitations.  Respiratory: Denied pulmonary symptoms, asthma, pleuritic pain, productive sputum, cough, dyspnea and wheezing.  Gastrointestinal: See HPI  Genitourinary: Denied genitourinary symptoms including symptomatic vaginal discharge, pelvic relaxation issues, and urinary complaints.  Musculoskeletal: Denied musculoskeletal symptoms, stiffness, swelling, muscle weakness and myalgia.  Dermatologic: Denied dermatology symptoms, rash and scar.  Neurologic: Denied neurology symptoms, dizziness, headache, neck pain and syncope.  Psychiatric: Denied psychiatric symptoms, anxiety and depression.  Endocrine: Denied endocrine symptoms including hot flashes and night sweats.   Medications    Current Discharge Medication List    CONTINUE these medications which have NOT CHANGED   Details  ondansetron (ZOFRAN ODT) 4 MG disintegrating tablet Take 2 tablets  (8 mg total) by mouth every 8 (eight) hours as needed for nausea or vomiting. Qty: 40 tablet, Refills: 1    Prenatal Vit-Fe Fumarate-FA (PRENATAL PO) Take 1 tablet by mouth every evening.    promethazine (PHENERGAN) 25 MG suppository Place 1 suppository (25 mg total) rectally every 6 (six) hours as needed for nausea or vomiting. Qty: 12 each, Refills: 0        Objective: Vitals:   08/09/17 1929 08/10/17 0808  BP: (!) 112/53 (!) 114/50  Pulse: 63 83  Resp: 20 17  Temp: 98 F (36.7 C) 98 F (36.7 C)  SpO2: 100% 98%      HEENT: Grossly within normal limits.  Normo-cephalic.  Neck Supple.  Pupils reactive.  Thyroid Smooth without nodularity or enlargement.  Skin No rashes, lesions or ulceration  Breasts: No masses or discharge.  Symmetric.  No axillary adenopathy.  Lungs: Clear to auscultation.  No rales or wheezes.  Heart: NSR.  No murmurs or rubs appreciated.  Abdomen: Soft.  Non-tender.  No masses.  No HSM.  Extremities: Moves all appropriately.  Normal ROM for age.  Neuro: Oriented to PPT.  Normal mood.    Pelvic:   Vulva: Normal appearance.  No lesions.  Vagina: No lesions or abnormalities noted.  Support: Normal pelvic support.  Urethra No masses tenderness or scarring.  Meatus Normal size without lesions or prolapse.  Cervix: Normal ectropion.  No lesions.  Anus: Normal exam.  No lesions.  Perineum: Normal exam.  No lesions.   Lage urinary ketones.           ASSESSMENT:  1.  Hyperemesis with metabolic change 2.  Failure of medical outpatient management.  PLAN: 1.  TSH. BHCG.  2.  IV fluids 3.  IV antiemetics  Brennan Bailey ,MD 08/10/2017,11:02 AM

## 2017-08-10 NOTE — Progress Notes (Signed)
Patient has been eating/drinking more throughout the day than yesterday. Tolerating more PO food and fluids for lunch and dinner. Patient states she is still having "waves of nausea." Nausea is being controlled with PO zofran q8h. Patient voiding adequately. No complaints of pain or vaginal bleeding.

## 2017-08-10 NOTE — Progress Notes (Signed)
Vitals stable this morning. Blood pressure has been trending low (114/50 this A.M.) but patient is asymptomatic. No pain or vaginal bleeding. Voiding appropriately. No nausea currently. Patient received Zofran at 0630 this morning. Patient states that Zofran has been working better than the Reglan did yesterday. Night shift RN reports that patient was able to tolerate some solid food last night after 8pm dose of Zofran. Patient hasn't had any vomiting during the night or this morning. Patient to order breakfast and attempt taking more fluid PO.

## 2017-08-10 NOTE — Progress Notes (Signed)
Initial Nutrition Assessment  DOCUMENTATION CODES:   Not applicable  INTERVENTION:  Reviewed "Morning Sickness Nutrition Therapy" handout from the Academy of [employer] with patient and her husband. Encouraged patient to eat 6 small meals/snacks throughout the day as smaller meals are usually easier to tolerate than larger meals. Encouraged her to keep easy-to-digest foods such as crackers and pretzels with her during the day. Also discussed sipping water and other caffeine-free beverages during the day to help stay hydrated. Discussed other tips for managing nausea.  Continue prenatal MVI daily. Patient had previously been taking it on an empty stomach. Discussed taking with food to help prevent nausea.  Consider also providing vitamin B6 10-50 mg Q8hrs in setting of hyperemesis.  Patient's husband is requesting a letter for work stating she is able to eat snacks during the day. Discussed request for letter with RN.  NUTRITION DIAGNOSIS:   Inadequate oral intake related to decreased appetite, nausea, vomiting as evidenced by per patient/family report.  GOAL:   Patient will meet greater than or equal to 90% of their needs  MONITOR:   PO intake, Labs, Weight trends, I & O's  REASON FOR ASSESSMENT:   Other (Comment)([facility] Report Nutritin Risk: 11 lbs in 2 weeks d/t hyperemesis)    ASSESSMENT:   22 year old female with PMHx of endometriosis, ovarian cyst s/p laparoscopic ovarian cystectomy 01/22/2016, endometriosis currently 101w1dgestation (LMP 06/07/2017) who is admitted with hyperemesis (of note patient also had hyperemesis in her previous pregnancy per chart).   Met with patient and husband at bedside. Patient reports she has been having occasional nausea during her entire pregnancy, but was able to keep her food down until recently. She reports over the past week she was not able to keep any solid food down and also had trouble keeping liquids down. Her nausea has  started to improve starting late last night and she is feeling better now. Last night at dinner patient had 1/2 of her sandwich and a few bites of potato. Today for breakfast she was able to eat her entire bagel. She was previously taking a prenatal MVI, but reports she has not been able to tolerate one the past week due to N/V.  Patient reports her pre-pregnancy weight was 195 lbs (pre-pregnancy BMI 33.5 kg/m2). Noted weight of 195.2 lbs on 07/09/2017. She reports that one week prior to the nausea getting worse she was 198 lbs on her home scale. She was 189.6 lbs on 08/07/2017. That is a weight loss of approximately 8 lbs (4% body weight) over 1-2 weeks.  Medications reviewed and include: prenatal MVI 1 tablet daily, D5-LR at 125 mL/hr (150 grams dextrose, 510 kcal daily), Zofran 8 mg Q8hrs PRN.  Labs reviewed.  Patient does not meet criteria for malnutrition at this time.  Discussed with RN.  NUTRITION - FOCUSED PHYSICAL EXAM:    Most Recent Value  Orbital Region  No depletion  Upper Arm Region  No depletion  Thoracic and Lumbar Region  No depletion  Buccal Region  No depletion  Temple Region  No depletion  Clavicle Bone Region  No depletion  Clavicle and Acromion Bone Region  No depletion  Scapular Bone Region  No depletion  Dorsal Hand  No depletion  Patellar Region  No depletion  Anterior Thigh Region  No depletion  Posterior Calf Region  No depletion  Edema (RD Assessment)  None  Hair  Reviewed  Eyes  Reviewed  Mouth  Reviewed  Skin  Reviewed  Nails  Reviewed     Diet Order:   Diet Order           Diet regular Room service appropriate? Yes; Fluid consistency: Thin  Diet effective now          EDUCATION NEEDS:   Education needs have been addressed  Skin:  Skin Assessment: Reviewed RN Assessment  Last BM:  Unknown/PTA  Height:   Ht Readings from Last 1 Encounters:  08/08/17 '5\' 4"'  (1.626 m)    Weight:   Wt Readings from Last 1 Encounters:  08/08/17 189 lb  (85.7 kg)    Ideal Body Weight:  54.5 kg  BMI:  Body mass index is 32.44 kg/m.  Estimated Nutritional Needs:   Kcal:  1800-2100  Protein:  95 grams  Fluid:  1.8-2.1 L/day  Willey Blade, MS, RD, LDN Office: [phone] Pager: [phone] After Hours/Weekend Pager: [phone]

## 2017-08-10 NOTE — Progress Notes (Signed)
Patient ID: Stephanie Hampton, female   DOB: [birth year], 22 y.o.   MRN: [id number]     Subjective:    Feel much better - ate some dinner last night and had a bagel for breakfast today.  Objective:    No data found. Total I/O In: -  Out: 450 [Urine:450]  Labs: No results found for this or any previous visit (from the past 24 hour(s)).  Medications    Current Discharge Medication List    CONTINUE these medications which have NOT CHANGED   Details  ondansetron (ZOFRAN ODT) 4 MG disintegrating tablet Take 2 tablets (8 mg total) by mouth every 8 (eight) hours as needed for nausea or vomiting. Qty: 40 tablet, Refills: 1    Prenatal Vit-Fe Fumarate-FA (PRENATAL PO) Take 1 tablet by mouth every evening.    promethazine (PHENERGAN) 25 MG suppository Place 1 suppository (25 mg total) rectally every 6 (six) hours as needed for nausea or vomiting. Qty: 12 each, Refills: 0          Assessment:    Hyperemesis - pt much improved with IV Zofran.  Plan:    Change to PO Zofran.  If pt continues to tolerate some PO consider discharge in AM.  Elonda Husky, M.D. 08/10/2017 10:11 AM

## 2017-08-11 ENCOUNTER — Other Ambulatory Visit: Payer: BLUE CROSS/BLUE SHIELD

## 2017-08-11 DIAGNOSIS — Z880 Allergy status to penicillin: Secondary | ICD-10-CM | POA: Diagnosis not present

## 2017-08-11 DIAGNOSIS — O211 Hyperemesis gravidarum with metabolic disturbance: Secondary | ICD-10-CM | POA: Diagnosis not present

## 2017-08-11 DIAGNOSIS — F329 Major depressive disorder, single episode, unspecified: Secondary | ICD-10-CM | POA: Diagnosis not present

## 2017-08-11 DIAGNOSIS — Z3A09 9 weeks gestation of pregnancy: Secondary | ICD-10-CM | POA: Diagnosis not present

## 2017-08-11 DIAGNOSIS — O99341 Other mental disorders complicating pregnancy, first trimester: Secondary | ICD-10-CM | POA: Diagnosis not present

## 2017-08-11 DIAGNOSIS — Z79899 Other long term (current) drug therapy: Secondary | ICD-10-CM | POA: Diagnosis not present

## 2017-08-11 MED ORDER — ONDANSETRON 4 MG PO TBDP: 8.0000 mg | ORAL_TABLET | Freq: Three times a day (TID) | ORAL | 1 refills | Status: DC | PRN

## 2017-08-11 NOTE — Discharge Summary (Signed)
    L&D OB Triage Note  SUBJECTIVE Stephanie Hampton is a 22 y.o. G2P1001 female at [redacted]w[redacted]d, EDD Estimated Date of Delivery: 03/14/18 who presented to triage with complaints of persistent N/V.   After IV hydration and a switch to IV then followed by PO Zofran she began to take both solids and liquids without vomiting.  OB History  Gravida Para Term Preterm AB Living  0 0 1  SAB TAB Ectopic Multiple Live Births  0 0 0 0 1    # Outcome Date GA Lbr Len/2nd Weight Sex Delivery Anes PTL Lv  2 Current           1 Term 2014   7 lb 4.8 oz (3.311 kg) F Vag-Spont   LIV    Medications Prior to Admission  Medication Sig Dispense Refill Last Dose  . ondansetron (ZOFRAN ODT) 4 MG disintegrating tablet Take 2 tablets (8 mg total) by mouth every 8 (eight) hours as needed for nausea or vomiting. 40 tablet 1 08/08/2017 at Unknown time  . Prenatal Vit-Fe Fumarate-FA (PRENATAL PO) Take 1 tablet by mouth every evening.   08/07/2017  . promethazine (PHENERGAN) 25 MG suppository Place 1 suppository (25 mg total) rectally every 6 (six) hours as needed for nausea or vomiting. 12 each 0 08/07/2017     OBJECTIVE  Nursing Evaluation:   BP 114/70 (BP Location: Left Arm)   Pulse 77   Temp 98.9 F (37.2 C) (Oral)   Resp 20   Ht  (1.626 m)   Wt 189 lb (85.7 kg)   LMP 06/07/2017 (Exact Date)   SpO2 99%   BMI 32.44 kg/m                        ASSESSMENT Impression:  1.  Pregnancy:  G2P1001 at [redacted]w[redacted]d , EDD Estimated Date of Delivery: 03/14/18 2.  Hyperemesis now improved to where she can take PO antiemetics and is keeping both food and liquids down.  PLAN 1. Cont PO Zofran PRN 2. Discharge home with standard labor precautions given to return to L&D or call the office for problems. 3. Continue routine prenatal care.

## 2017-08-11 NOTE — Progress Notes (Signed)
Patient discharged home. Discharge instructions, prescriptions and follow up appointment given to and reviewed with patient. Patient verbalized understanding. 

## 2017-08-13 ENCOUNTER — Encounter: Payer: Self-pay | Admitting: Obstetrics and Gynecology

## 2017-08-13 ENCOUNTER — Ambulatory Visit (INDEPENDENT_AMBULATORY_CARE_PROVIDER_SITE_OTHER): Payer: BLUE CROSS/BLUE SHIELD

## 2017-08-13 DIAGNOSIS — N926 Irregular menstruation, unspecified: Secondary | ICD-10-CM

## 2017-08-13 DIAGNOSIS — Z3201 Encounter for pregnancy test, result positive: Secondary | ICD-10-CM | POA: Diagnosis not present

## 2017-08-17 ENCOUNTER — Emergency Department
Admission: EM | Admit: 2017-08-17 | Discharge: 2017-08-17 | Disposition: A | Discharge status: Home / Self Care | Payer: BLUE CROSS/BLUE SHIELD | Attending: Emergency Medicine | Admitting: Emergency Medicine

## 2017-08-17 ENCOUNTER — Emergency Department: Payer: BLUE CROSS/BLUE SHIELD

## 2017-08-17 ENCOUNTER — Encounter: Payer: Self-pay | Admitting: Emergency Medicine

## 2017-08-17 ENCOUNTER — Other Ambulatory Visit: Payer: Self-pay

## 2017-08-17 DIAGNOSIS — Z3A10 10 weeks gestation of pregnancy: Secondary | ICD-10-CM | POA: Insufficient documentation

## 2017-08-17 DIAGNOSIS — O9989 Other specified diseases and conditions complicating pregnancy, childbirth and the puerperium: Secondary | ICD-10-CM | POA: Insufficient documentation

## 2017-08-17 DIAGNOSIS — O200 Threatened abortion: Secondary | ICD-10-CM | POA: Diagnosis not present

## 2017-08-17 DIAGNOSIS — O209 Hemorrhage in early pregnancy, unspecified: Secondary | ICD-10-CM | POA: Diagnosis not present

## 2017-08-17 DIAGNOSIS — Z79899 Other long term (current) drug therapy: Secondary | ICD-10-CM | POA: Diagnosis not present

## 2017-08-17 DIAGNOSIS — R109 Unspecified abdominal pain: Secondary | ICD-10-CM | POA: Diagnosis not present

## 2017-08-17 LAB — HCG, QUANTITATIVE, PREGNANCY: hCG, Beta Chain, Quant, S: 143625 m[IU]/mL — ABNORMAL HIGH (ref <5)

## 2017-08-17 NOTE — Discharge Instructions (Signed)
Please seek medical attention for any high fevers, chest pain, shortness of breath, change in behavior, persistent vomiting, bloody stool or any other new or concerning symptoms.  

## 2017-08-17 NOTE — ED Notes (Signed)
Family at bedside. 

## 2017-08-17 NOTE — ED Notes (Signed)
ED Provider at bedside with US 

## 2017-08-17 NOTE — ED Provider Notes (Signed)
[facility] Emergency Department Provider Note   ____________________________________________   I have reviewed the triage vital signs and the nursing notes.   HISTORY  Chief Complaint Vaginal Bleeding and Constipation   History limited by: Not Limited   HPI Stephanie Hampton is a 22 y.o. female who presents to the emergency department today at the suggestion of OB/GYN clinic because of concerns for vaginal bleeding and abdominal pain.  Patient states she is roughly [redacted] weeks pregnant.  She states that today she started having abdominal pain.  She does have issues with constipation this pregnancy and is wondering if it could be related to that.  However she did notice some vaginal bleeding.  She has not had any earlier vaginal bleeding this pregnancy.  She has however had ultrasounds which have showed an intrauterine pregnancy.  Had a recent admission for hyperemesis gravidarum.   Per medical record review patient has a history of recent admission for hyperemesis. US showing IUP, blood work showing blood type A POS   Past Medical History:  Diagnosis Date  . Depression   . Endometriosis   . Ovarian cyst     Patient Active Problem List   Diagnosis Date Noted  . Hyperemesis affecting pregnancy, antepartum 08/08/2017  . Positive urine pregnancy test 07/09/2017  . BMI 33.0-33.9,adult 07/09/2017  . Hx of migraine headaches 07/09/2017  . Endometriosis determined by laparoscopy 01/30/2016  . Pelvic pain 01/17/2016    Past Surgical History:  Procedure Laterality Date  . LAPAROSCOPIC OVARIAN CYSTECTOMY Left 01/22/2016   Procedure: LAPAROSCOPIC LEFT OVARIAN CYSTECTOMY-POSSIBLE BIOPSIES;  Surgeon: Herold Harms, MD;  Location: [facility] ORS;  Service: Gynecology;  Laterality: Left;  excision/fulgeration of endometriosis  . NO PAST SURGERIES      Prior to Admission medications   Medication Sig Start Date End Date Taking? Authorizing Provider  ondansetron  (ZOFRAN ODT) 4 MG disintegrating tablet Take 2 tablets (8 mg total) by mouth every 8 (eight) hours as needed for nausea or vomiting. 08/11/17   Linzie Collin, MD  Prenatal Vit-Fe Fumarate-FA (PRENATAL PO) Take 1 tablet by mouth every evening.    [provider]    Allergies Augmentin [amoxicillin-pot clavulanate]  Family History  Problem Relation Age of Onset  . Heart disease Father   . Cancer Neg Hx   . Ovarian cancer Neg Hx   . Colon cancer Neg Hx   . Diabetes Neg Hx     Social History Social History   Tobacco Use  . Smoking status: Never Smoker  . Smokeless tobacco: Never Used  Substance Use Topics  . Alcohol use: No    Alcohol/week: 0.0 oz  . Drug use: No    Review of Systems Constitutional: No fever/chills Eyes: No visual changes. ENT: No sore throat. Cardiovascular: Denies chest pain. Respiratory: Denies shortness of breath. Gastrointestinal: Positive for abdominal pain, constipation. Genitourinary: Positive for vaginal bleeding.  Musculoskeletal: Negative for back pain. Skin: Negative for rash. Neurological: Negative for headaches, focal weakness or numbness.  ____________________________________________   PHYSICAL EXAM:  VITAL SIGNS: ED Triage Vitals  Enc Vitals Group     BP 08/17/17 2144 (!) 122/48     Pulse Rate 08/17/17 2142 82     Resp 08/17/17 2142 16     Temp 08/17/17 2142 98.9 F (37.2 C)     Temp Source 08/17/17 2142 Oral     SpO2 08/17/17 2142 99 %     Weight 08/17/17 2144 185 lb (83.9 kg)  Height 08/17/17 2144  (1.626 m)     Head Circumference --      Peak Flow --      Pain Score 08/17/17 2143 7   Constitutional: Alert and oriented. Well appearing and in no distress. Eyes: Conjunctivae are normal.  ENT   Head: Normocephalic and atraumatic.   Nose: No congestion/rhinnorhea.   Mouth/Throat: Mucous membranes are moist.   Neck: No stridor. Hematological/Lymphatic/Immunilogical: No cervical  lymphadenopathy. Cardiovascular: Normal rate, regular rhythm.  No murmurs, rubs, or gallops. Respiratory: Normal respiratory effort without tachypnea nor retractions. Breath sounds are clear and equal bilaterally. No wheezes/rales/rhonchi. Gastrointestinal: Soft and slightly diffusely tender. No rebound. No guarding.  Genitourinary: Deferred Musculoskeletal: Normal range of motion in all extremities. No lower extremity edema. Neurologic:  Normal speech and language. No gross focal neurologic deficits are appreciated.  Skin:  Skin is warm, dry and intact. No rash noted. Psychiatric: Mood and affect are normal. Speech and behavior are normal. Patient exhibits appropriate insight and judgment.  ____________________________________________    LABS (pertinent positives/negatives)  HCG in process  ____________________________________________   EKG  None  ____________________________________________    RADIOLOGY  None  ____________________________________________   PROCEDURES  Procedures  ____________________________________________   INITIAL IMPRESSION / ASSESSMENT AND PLAN / ED COURSE  Pertinent labs & imaging results that were available during my care of the patient were reviewed by me and considered in my medical decision making (see chart for details).  Patient presented to the emergency department the request of OB/GYN clinic today because of concerns for abdominal pain and vaginal bleeding.  Patient has a known intrauterine pregnancy.  Bedside ultrasound does show good fetal heartbeat.  Beta patient has a positive blood type.  Will check beta hCG.  ____________________________________________   FINAL CLINICAL IMPRESSION(S) / ED DIAGNOSES  Vaginal bleeding in pregnancy  Note: This dictation was prepared with Dragon dictation. Any transcriptional errors that result from this process are unintentional     Phineas Semen, MD 08/17/17 2300

## 2017-08-17 NOTE — ED Notes (Signed)
ED Provider at bedside. 

## 2017-08-17 NOTE — ED Triage Notes (Signed)
Patient to ED for "vaginal bleeding" when she wipes. Has on a pad but has not saturated it. States also having trouble with constipation and was wondering if straining may have caused some bleeding. Second pregnancy for patient; patient of Dr. Logan Bores at [facility].

## 2017-08-18 ENCOUNTER — Encounter: Payer: Self-pay | Admitting: Obstetrics and Gynecology

## 2017-08-18 ENCOUNTER — Ambulatory Visit (INDEPENDENT_AMBULATORY_CARE_PROVIDER_SITE_OTHER): Payer: BLUE CROSS/BLUE SHIELD | Admitting: Obstetrics and Gynecology

## 2017-08-18 VITALS — BP 109/73 | HR 96 | Wt 185.5 lb

## 2017-08-18 VITALS — BP 109/73 | HR 96 | Ht 64.0 in | Wt 185.8 lb

## 2017-08-18 DIAGNOSIS — R638 Other symptoms and signs concerning food and fluid intake: Secondary | ICD-10-CM | POA: Diagnosis not present

## 2017-08-18 DIAGNOSIS — Z202 Contact with and (suspected) exposure to infections with a predominantly sexual mode of transmission: Secondary | ICD-10-CM | POA: Diagnosis not present

## 2017-08-18 DIAGNOSIS — Z3481 Encounter for supervision of other normal pregnancy, first trimester: Secondary | ICD-10-CM

## 2017-08-18 DIAGNOSIS — O4690 Antepartum hemorrhage, unspecified, unspecified trimester: Secondary | ICD-10-CM | POA: Diagnosis not present

## 2017-08-18 LAB — POCT URINALYSIS DIPSTICK
BILIRUBIN UA: NEGATIVE
Glucose, UA: NEGATIVE
Nitrite, UA: NEGATIVE
Odor: NEGATIVE
PH UA: 8 (ref 5.0–8.0)
Spec Grav, UA: 1.01 (ref 1.010–1.025)
UROBILINOGEN UA: 0.2 U/dL

## 2017-08-18 NOTE — Progress Notes (Signed)
ER f/u vaginal bleeding.

## 2017-08-18 NOTE — Progress Notes (Signed)
      Stephanie Hampton presents for NOB nurse intake visit. Pregnancy confirmation done at [facility],July 08, 2017, with Dorothea Glassman . G2.  P1001.  LMP 06-07-17.  EDD 03-14-18.  Ga. Pregnancy education material explained and given.  1 cats in the home.  NOB labs ordered. BMI greater than 30. TSH/HbgA1c. HIV and drug screen explained and ordered. Genetic screening discussed. Genetic testing; Unsure. Pt to discuss genetic testing with provider. PNV encouraged. Pt to follow up with provider in 4 weeks for NOB physical.

## 2017-08-18 NOTE — Progress Notes (Signed)
ED F/U: Patient seen yesterday in the emergency department for spotting.  She was constipated and says she was straining at stool and she noticed that she began spotting.  An ultrasound performed in the emergency department revealed a viable pregnancy.  There was some concern because her quantitative beta-hCG went from 200,000 to 150,000. She reports that today her bleeding is less and it has become darker in color.  She reports no cramping. She no longer has persistent vomiting she is able to take liquids and some food. (Still has large ketones) we have discussed nausea and vomiting in detail and I recommended Ensure because the patient believes she can drink this without throwing it up. I have reassured her regarding her quantitative beta-hCG and informed her that a viable pregnancy and ultrasound is a more definitive indication of successful pregnancy. Warnings regarding possible miscarriage given.  Patient to contact us if her bleeding or cramping becomes worse.  I spent 18 minutes involved in the care of this patient of which greater than 50% was spent discussing bleeding in early pregnancy, beta-hCG levels, risk of miscarriage, hyperemesis, urinary ketones.

## 2017-08-18 NOTE — Progress Notes (Deleted)
      Stephanie Hampton presents for NOB nurse intake visit. Pregnancy confirmation done at place,date, with who_______.  G.  P.  LMP.  EDD.  Ga. Pregnancy education material explained and given.  ___cats in the home.  NOB labs ordered. BMI greater than 30. TSH/HbgA1c. Sickle cell order due to race. HIV and drug screen explained and ordered/declined. Genetic screening discussed. Genetic testing; Ordered/Declined/Unsure. Pt to discuss genetic testing with provider. PNV encouraged. Pt to follow up with provider in ______________.weeks for NOB physical.

## 2017-08-19 LAB — CBC WITH DIFFERENTIAL/PLATELET
BASOS: 0 %
Basophils Absolute: 0 10*3/uL (ref 0.0–0.2)
EOS (ABSOLUTE): 0 10*3/uL (ref 0.0–0.4)
EOS: 0 %
HEMOGLOBIN: 16.3 g/dL — AB (ref 11.1–15.9)
Hematocrit: 47.7 % — ABNORMAL HIGH (ref 34.0–46.6)
IMMATURE GRANS (ABS): 0 10*3/uL (ref 0.0–0.1)
Immature Granulocytes: 0 %
LYMPHS: 10 %
Lymphocytes Absolute: 1.6 10*3/uL (ref 0.7–3.1)
MCH: 29.6 pg (ref 26.6–33.0)
MCHC: 34.2 g/dL (ref 31.5–35.7)
MCV: 87 fL (ref 79–97)
MONOCYTES: 6 %
Monocytes Absolute: 1 10*3/uL — ABNORMAL HIGH (ref 0.1–0.9)
Neutrophils Absolute: 12.5 10*3/uL — ABNORMAL HIGH (ref 1.4–7.0)
Neutrophils: 84 %
Platelets: 273 10*3/uL (ref 150–450)
RBC: 5.5 x10E6/uL — ABNORMAL HIGH (ref 3.77–5.28)
RDW: 13.8 % (ref 12.3–15.4)
WBC: 15.1 10*3/uL — ABNORMAL HIGH (ref 3.4–10.8)

## 2017-08-19 LAB — MICROSCOPIC EXAMINATION
Casts: NONE SEEN /lpf
EPITHELIAL CELLS (NON RENAL): NONE SEEN /HPF (ref 0–10)

## 2017-08-19 LAB — DRUG PROFILE, UR, 9 DRUGS (LABCORP)
Amphetamines, Urine: NEGATIVE ng/mL
Barbiturate Quant, Ur: NEGATIVE ng/mL
Benzodiazepine Quant, Ur: NEGATIVE ng/mL
COCAINE (METAB.): NEGATIVE ng/mL
Cannabinoid Quant, Ur: NEGATIVE ng/mL
METHADONE SCREEN, URINE: NEGATIVE ng/mL
Opiate Quant, Ur: NEGATIVE ng/mL
PCP QUANT UR: NEGATIVE ng/mL
Propoxyphene: NEGATIVE ng/mL

## 2017-08-19 LAB — VARICELLA ZOSTER ANTIBODY, IGG: Varicella zoster IgG: 471 index (ref >165)

## 2017-08-19 LAB — HEMOGLOBIN A1C
ESTIMATED AVERAGE GLUCOSE: 97 mg/dL
Hgb A1c MFr Bld: 5 % (ref 4.8–5.6)

## 2017-08-19 LAB — URINALYSIS, ROUTINE W REFLEX MICROSCOPIC
Bilirubin, UA: NEGATIVE
Glucose, UA: NEGATIVE
Nitrite, UA: NEGATIVE
PH UA: 8.5 — AB (ref 5.0–7.5)
PROTEIN UA: NEGATIVE
RBC, UA: NEGATIVE
Specific Gravity, UA: 1.021 (ref 1.005–1.030)
Urobilinogen, Ur: 0.2 mg/dL (ref 0.2–1.0)

## 2017-08-19 LAB — GC/CHLAMYDIA PROBE AMP
Chlamydia trachomatis, NAA: NEGATIVE
Neisseria gonorrhoeae by PCR: NEGATIVE

## 2017-08-19 LAB — NICOTINE SCREEN, URINE: COTININE UR QL SCN: NEGATIVE ng/mL

## 2017-08-19 LAB — RUBELLA SCREEN: Rubella Antibodies, IGG: 4.59 index (ref >0.99)

## 2017-08-19 LAB — ABO AND RH: Rh Factor: POSITIVE

## 2017-08-19 LAB — ANTIBODY SCREEN: Antibody Screen: NEGATIVE

## 2017-08-19 LAB — HIV ANTIBODY (ROUTINE TESTING W REFLEX): HIV SCREEN 4TH GENERATION: NONREACTIVE

## 2017-08-19 LAB — RPR: RPR Ser Ql: NONREACTIVE

## 2017-08-19 LAB — HEPATITIS B SURFACE ANTIGEN: HEP B S AG: NEGATIVE

## 2017-08-19 LAB — TSH: TSH: 0.264 u[IU]/mL — AB (ref 0.450–4.500)

## 2017-08-21 ENCOUNTER — Encounter: Payer: Self-pay | Admitting: Obstetrics and Gynecology

## 2017-08-21 LAB — CULTURE, OB URINE

## 2017-08-21 LAB — URINE CULTURE, OB REFLEX: ORGANISM ID, BACTERIA: NO GROWTH

## 2017-08-27 ENCOUNTER — Other Ambulatory Visit: Payer: Self-pay

## 2017-08-27 ENCOUNTER — Telehealth: Payer: Self-pay | Admitting: *Deleted

## 2017-08-27 ENCOUNTER — Emergency Department
Admission: EM | Admit: 2017-08-27 | Discharge: 2017-08-27 | Disposition: A | Discharge status: Home / Self Care | Payer: BLUE CROSS/BLUE SHIELD | Attending: Emergency Medicine | Admitting: Emergency Medicine

## 2017-08-27 DIAGNOSIS — O9989 Other specified diseases and conditions complicating pregnancy, childbirth and the puerperium: Secondary | ICD-10-CM | POA: Insufficient documentation

## 2017-08-27 DIAGNOSIS — O99281 Endocrine, nutritional and metabolic diseases complicating pregnancy, first trimester: Secondary | ICD-10-CM | POA: Diagnosis not present

## 2017-08-27 DIAGNOSIS — O210 Mild hyperemesis gravidarum: Secondary | ICD-10-CM | POA: Insufficient documentation

## 2017-08-27 DIAGNOSIS — R42 Dizziness and giddiness: Secondary | ICD-10-CM | POA: Insufficient documentation

## 2017-08-27 DIAGNOSIS — E860 Dehydration: Secondary | ICD-10-CM | POA: Diagnosis not present

## 2017-08-27 DIAGNOSIS — O219 Vomiting of pregnancy, unspecified: Secondary | ICD-10-CM

## 2017-08-27 DIAGNOSIS — O26891 Other specified pregnancy related conditions, first trimester: Secondary | ICD-10-CM | POA: Diagnosis not present

## 2017-08-27 DIAGNOSIS — Z3A11 11 weeks gestation of pregnancy: Secondary | ICD-10-CM | POA: Insufficient documentation

## 2017-08-27 LAB — BASIC METABOLIC PANEL
Anion gap: 11 (ref 5–15)
BUN: 7 mg/dL (ref 6–20)
CALCIUM: 9.4 mg/dL (ref 8.9–10.3)
CHLORIDE: 102 mmol/L (ref 101–111)
CO2: 22 mmol/L (ref 22–32)
CREATININE: 0.52 mg/dL (ref 0.44–1.00)
Glucose, Bld: 104 mg/dL — ABNORMAL HIGH (ref 65–99)
Potassium: 3.5 mmol/L (ref 3.5–5.1)
SODIUM: 135 mmol/L (ref 135–145)

## 2017-08-27 LAB — URINALYSIS, COMPLETE (UACMP) WITH MICROSCOPIC
BILIRUBIN URINE: NEGATIVE
GLUCOSE, UA: NEGATIVE mg/dL
Hgb urine dipstick: NEGATIVE
Ketones, ur: NEGATIVE mg/dL
LEUKOCYTES UA: NEGATIVE
Nitrite: NEGATIVE
PH: 5 (ref 5.0–8.0)
Protein, ur: NEGATIVE mg/dL
SPECIFIC GRAVITY, URINE: 1.015 (ref 1.005–1.030)

## 2017-08-27 LAB — CBC
HCT: 45.1 % (ref 35.0–47.0)
Hemoglobin: 15.3 g/dL (ref 12.0–16.0)
MCH: 30 pg (ref 26.0–34.0)
MCHC: 34 g/dL (ref 32.0–36.0)
MCV: 88.1 fL (ref 80.0–100.0)
PLATELETS: 289 10*3/uL (ref 150–440)
RBC: 5.11 MIL/uL (ref 3.80–5.20)
RDW: 13.3 % (ref 11.5–14.5)
WBC: 10.6 10*3/uL (ref 3.6–11.0)

## 2017-08-27 LAB — PREGNANCY, URINE: Preg Test, Ur: POSITIVE — AB

## 2017-08-27 MED ORDER — SODIUM CHLORIDE 0.9 % IV BOLUS
1000.0000 mL | Freq: Once | INTRAVENOUS | Status: AC
  Administered 2017-08-27: 1000 mL via INTRAVENOUS

## 2017-08-27 NOTE — Telephone Encounter (Signed)
Patient called and states she is experiencing preg issues, she states she feels faint and her heart is racing> Patient is 11 wks preg. She is concern. She is requesting a call back. Her contact # is [phone]. Please advise. Thank you

## 2017-08-27 NOTE — ED Notes (Addendum)
Pt stated that since Sunday she has felt Lightheaded, HA, heart racing, and had vision loss. Gradually getting worse. Pt is alert and oriented x 4. Denies pain at this time. Family at bedside. Pt stated that she passed out yesterday while at work and thought it was due to dehydration.

## 2017-08-27 NOTE — ED Notes (Signed)
Informed RN that patient has been roomed and is ready for evaluation.  Patient in NAD at this time and call bell placed within reach.   

## 2017-08-27 NOTE — ED Triage Notes (Signed)
Pt states x few days when she stands up feels dizzy/lightheaded. States yesterday passed out. Called Dr. Logan Bores at [facility] for OB, [redacted] weeks pregnant. They told her she is dehydrated. Denies pain, denies pregnancy complaints. Alert, oriented, ambulatory.

## 2017-08-27 NOTE — ED Provider Notes (Signed)
[facility] Emergency Department Provider Note   ____________________________________________   First MD Initiated Contact with Patient 08/27/17 2046     (approximate)  I have reviewed the triage vital signs and the nursing notes.   HISTORY  Chief Complaint Dizziness    HPI Stephanie Hampton is a 22 y.o. female no major medical history except currently about 11-1/[redacted] weeks pregnant  Patient reports she comes for evaluation due to lightheadedness.  She is experienced lots of "hyperemesis" to do this pregnancy.  She is currently on Zofran, and reports that she has been getting dehydrated.  She does not have much appetite, but works to try to stay hydrated.  Yesterday she was at work, she started feeling very lightheaded, and felt the symptoms may at times during pregnancy and started to feel so lightheaded she thought she is going to pass out.  She was able to recognize the symptoms and lay on the ground and then reports she just briefly fainted.  She was unable to get up and go home, she is been trying to stay hydrated in bed about the last day.  Patient reports that she has been feeling slightly better, continues to feel lightheaded when she walks or stands for the last day.  No chest pain.  No trouble breathing.  No leg swelling.  No abdominal pain.  No vaginal bleeding or discharge.  Does report she had some vaginal spotting a couple weeks ago, but was seen by Dr. Logan Bores in the interim and everything looked okay.      Past Medical History:  Diagnosis Date  . Depression   . Endometriosis   . Ovarian cyst     Patient Active Problem List   Diagnosis Date Noted  . Hyperemesis affecting pregnancy, antepartum 08/08/2017  . Positive urine pregnancy test 07/09/2017  . BMI 33.0-33.9,adult 07/09/2017  . Hx of migraine headaches 07/09/2017  . Endometriosis determined by laparoscopy 01/30/2016  . Pelvic pain 01/17/2016    Past Surgical History:  Procedure  Laterality Date  . LAPAROSCOPIC OVARIAN CYSTECTOMY Left 01/22/2016   Procedure: LAPAROSCOPIC LEFT OVARIAN CYSTECTOMY-POSSIBLE BIOPSIES;  Surgeon: Herold Harms, MD;  Location: [facility] ORS;  Service: Gynecology;  Laterality: Left;  excision/fulgeration of endometriosis  . NO PAST SURGERIES      Prior to Admission medications   Medication Sig Start Date End Date Taking? Authorizing Provider  ondansetron (ZOFRAN ODT) 4 MG disintegrating tablet Take 2 tablets (8 mg total) by mouth every 8 (eight) hours as needed for nausea or vomiting. 08/11/17  Yes Linzie Collin, MD  Prenatal Vit-Fe Fumarate-FA (PRENATAL PO) Take 1 tablet by mouth every evening.   Yes [provider]    Allergies Augmentin [amoxicillin-pot clavulanate]  Family History  Problem Relation Age of Onset  . Heart disease Father   . Cancer Neg Hx   . Ovarian cancer Neg Hx   . Colon cancer Neg Hx   . Diabetes Neg Hx     Social History Social History   Tobacco Use  . Smoking status: Never Smoker  . Smokeless tobacco: Never Used  Substance Use Topics  . Alcohol use: No    Alcohol/week: 0.0 oz  . Drug use: No    Review of Systems Constitutional: No fever/chills Eyes: No visual changes. ENT: No sore throat. Cardiovascular: Denies chest pain. Respiratory: Denies shortness of breath. Gastrointestinal: No abdominal pain.  Frequent nausea, no vomiting for about 2 weeks.  Currently taking Zofran as needed which helps.  No diarrhea.  No constipation. Genitourinary: Negative for dysuria. Musculoskeletal: Negative for back pain. Skin: Negative for rash. Neurological: Negative for headaches, focal weakness or numbness.  See HPI.    ____________________________________________   PHYSICAL EXAM:  VITAL SIGNS: ED Triage Vitals [08/27/17 1739]  Enc Vitals Group     BP (!) 144/63     Pulse Rate 81     Resp 16     Temp 98.1 F (36.7 C)     Temp Source Oral     SpO2 100 %     Weight 185 lb (83.9 kg)       Height  (1.626 m)     Head Circumference      Peak Flow      Pain Score 0     Pain Loc      Pain Edu?      Excl. in GC?     Constitutional: Alert and oriented. Well appearing and in no acute distress.  She is very pleasant.  Appears just slightly fatigued. Eyes: Conjunctivae are normal. Head: Atraumatic. Nose: No congestion/rhinnorhea. Mouth/Throat: Mucous membranes are slightly dry. Neck: No stridor.   Cardiovascular: Normal rate, regular rhythm. Grossly normal heart sounds.  Good peripheral circulation. Respiratory: Normal respiratory effort.  No retractions. Lungs CTAB. Gastrointestinal: Soft and nontender. No distention.  Not obviously gravid.  No pain or discomfort on abdominal exam. Musculoskeletal: No lower extremity tenderness nor edema. Neurologic:  Normal speech and language. No gross focal neurologic deficits are appreciated.  Skin:  Skin is warm, dry and intact. No rash noted. Psychiatric: Mood and affect are normal. Speech and behavior are normal.  ____________________________________________   LABS (all labs ordered are listed, but only abnormal results are displayed)  Labs Reviewed  BASIC METABOLIC PANEL - Abnormal; Notable for the following components:      Result Value   Glucose, Bld 104 (*)    All other components within normal limits  URINALYSIS, COMPLETE (UACMP) WITH MICROSCOPIC - Abnormal; Notable for the following components:   Color, Urine YELLOW (*)    APPearance CLEAR (*)    Bacteria, UA RARE (*)    All other components within normal limits  PREGNANCY, URINE - Abnormal; Notable for the following components:   Preg Test, Ur POSITIVE (*)    All other components within normal limits  CBC  CBG MONITORING, ED   ____________________________________________  EKG  ED ECG REPORT I, Sharyn Creamer, the attending physician, personally viewed and interpreted this ECG.  Date: 08/27/2017 EKG Time: 1740 Rate: 90 Rhythm: normal sinus rhythm QRS  Axis: normal Intervals: normal ST/T Wave abnormalities: normal Narrative Interpretation: no evidence of acute ischemia  ____________________________________________  RADIOLOGY   ____________________________________________   PROCEDURES  Procedure(s) performed: None  Procedures  Critical Care performed: No  ____________________________________________   INITIAL IMPRESSION / ASSESSMENT AND PLAN / ED COURSE  Pertinent labs & imaging results that were available during my care of the patient were reviewed by me and considered in my medical decision making (see chart for details).  Patient is for evaluation of syncope.  Lightheadedness, proceeding and today as well.  Reports similar symptoms with significant lightheadedness for the last month due to hyperemesis.  She does appear to slightly hypovolemic.  Her lab work is very reassuring.  She denies any issues that sound to be pregnancy complication other than likely dehydration and hyperemesis.  Nausea is controlled with Zofran but reports significantly decreased appetite.  She spoke with her obstetrician recommended to come  to the ER for IV fluids.  We will hydrate her closely.  No signs or symptoms of concerning arrhythmia, acute cardiac process, acute neurologic process, acute anemia or other complication describe her syncope.  I suspect this is due to dehydration.  ----------------------------------------- 11:20 PM on 08/27/2017 -----------------------------------------  After hydration, patient ambulating well.  Reports she feels better.  Would like to be able go home and rest and will follow closely with Dr. Logan Bores.  Return precautions and treatment recommendations and follow-up discussed with the patient who is agreeable with the plan.       ____________________________________________   FINAL CLINICAL IMPRESSION(S) / ED DIAGNOSES  Final diagnoses:  Dehydration, mild  Vomiting or nausea of pregnancy      NEW  MEDICATIONS STARTED DURING THIS VISIT:  New Prescriptions   No medications on file     Note:  This document was prepared using Dragon voice recognition software and may include unintentional dictation errors.     Sharyn Creamer, MD 08/27/17 2320

## 2017-08-27 NOTE — Telephone Encounter (Signed)
Ob 11 4/7  Pt states for the last 3 days she has been having h/a, hrt racing, seeing stars, and  Lightheadedness. She passed out at work yesterday. Today she is not at work. She is only able to Since 7 am she has had 30oz of fluids. She has urinated x 2. She is eating small frequent meals. Advised pt to drink gatorade. She is thinking she may need fluids. Advised I will ask DJE and call her back.

## 2017-08-28 ENCOUNTER — Ambulatory Visit (INDEPENDENT_AMBULATORY_CARE_PROVIDER_SITE_OTHER): Payer: BLUE CROSS/BLUE SHIELD | Admitting: Obstetrics and Gynecology

## 2017-08-28 VITALS — BP 110/73 | HR 90 | Wt 187.4 lb

## 2017-08-28 DIAGNOSIS — R824 Acetonuria: Secondary | ICD-10-CM

## 2017-08-28 DIAGNOSIS — R1110 Vomiting, unspecified: Secondary | ICD-10-CM

## 2017-08-28 DIAGNOSIS — Z3481 Encounter for supervision of other normal pregnancy, first trimester: Secondary | ICD-10-CM

## 2017-08-28 LAB — POCT URINALYSIS DIPSTICK
Bilirubin, UA: NEGATIVE
GLUCOSE UA: NEGATIVE
Leukocytes, UA: NEGATIVE
NITRITE UA: NEGATIVE
PROTEIN UA: POSITIVE — AB
Spec Grav, UA: 1.015 (ref 1.010–1.025)
Urobilinogen, UA: 0.2 E.U./dL
pH, UA: 7 (ref 5.0–8.0)

## 2017-08-28 MED ORDER — PROMETHAZINE HCL 25 MG PO TABS: 25.0000 mg | ORAL_TABLET | Freq: Four times a day (QID) | ORAL | 2 refills | Status: DC | PRN

## 2017-08-28 MED ORDER — PROMETHAZINE HCL 25 MG/ML IJ SOLN
25.0000 mg | Freq: Once | INTRAMUSCULAR | Status: AC
  Administered 2017-08-28: 25 mg via INTRAMUSCULAR

## 2017-08-28 NOTE — Progress Notes (Signed)
OBSTETRICS/GYNECOLOGY CLINIC PROGRESS NOTE Subjective:    Stephanie Hampton is a 22 y.o. G2P1001 female being seen today for a problem OB visit.  She is at [redacted]w[redacted]d gestation. Patient reports persistent nausea and vomiting despite the use of Zofran.  Notes that she was seen in the Emergency Room yesterday for persistent nausea and vomiting and light-headedness. States that she received IVF, Reglan, and was told to continue her Zofran.  Notes that she felt better initially, however later that evening she begin to have worsening nausea/vomiting.  She has not been able to keep anything down including food or liquids. Continues to note light-headedness with most movements.  She denies cramping, vaginal bleeding.   Menstrual History: OB History    Gravida  2   Para  1   Term  1   Preterm      AB      Living  1     SAB      TAB      Ectopic      Multiple      Live Births  1            The following portions of the patient's history were reviewed and updated as appropriate: allergies, current medications, past family history, past medical history, past social history, past surgical history and problem list.  Review of Systems Pertinent items noted in HPI and remainder of comprehensive ROS otherwise negative.   Objective:     BP 110/73   Pulse 90   Wt 187 lb 6.4 oz (85 kg)   LMP 06/07/2017 (Exact Date)   BMI 32.17 kg/m   General appearance: alert, fatigued and pale Lungs: clear to auscultation bilaterally Heart: regular rate and rhythm, S1, S2 normal, no murmur, click, rub or gallop Abdomen: soft, non-tender; bowel sounds normal; no masses,  no organomegaly. FHT 163 bpm Skin: no lesions noted and pallor present.     Labs:  Lab Results  Component Value Date   WBC 10.6 08/27/2017   HGB 15.3 08/27/2017   HCT 45.1 08/27/2017   MCV 88.1 08/27/2017   PLT 289 08/27/2017    Lab Results  Component Value Date   CREATININE 0.52 08/27/2017   BUN 7 08/27/2017   NA 135  08/27/2017   K 3.5 08/27/2017   CL 102 08/27/2017   CO2 22 08/27/2017    Lab Results  Component Value Date   ALT 20 07/08/2017   AST 23 07/08/2017   ALKPHOS 81 07/08/2017   BILITOT 0.9 07/08/2017    Results for orders placed or performed in visit on 08/28/17  POCT urinalysis dipstick  Result Value Ref Range   Color, UA yellow    Clarity, UA clear    Glucose, UA Negative Negative   Bilirubin, UA neg    Ketones, UA mod    Spec Grav, UA 1.015 1.010 - 1.025   Blood, UA non-hem trace    pH, UA 7.0 5.0 - 8.0   Protein, UA Positive (A) Negative   Urobilinogen, UA 0.2 0.2 or 1.0 E.U./dL   Nitrite, UA neg    Leukocytes, UA Negative Negative   Appearance yellow    Odor      Assessment:    Pregnancy 11 and 5/7 weeks   Hyperemesis Ketonuria  Plan:    Problem list reviewed and updated. Labs reviewed. Patient currently taking Zofran but no longer receiving relief. Advised on NPO status except Pedialyte and popsicles x 1 day, then can increase to  BRAT diet.  Will also prescribe Phenergan. Patient notes that she had Phenergan suppositories, but only used once as she did not feel that they helped (although she does note having a BM  1 hr after use). Will give dose of IM Phenergan in office today, and prescription to alternate Zofran and Phenergan every 4 hours.  If patient's symptoms have not improved in 24 hrs, can consider admission for hyperemesis.    Hildred Laser, MD [facility]

## 2017-08-28 NOTE — Progress Notes (Signed)
ROB-pt stated that she has been n/v. Pt stated that she is feeling tired, weak, lightheaded, and dizzy. Pt stated that she is unable to keep anything down (food/water). Pt stated that she feels like she will pass out and did at work once.

## 2017-09-02 ENCOUNTER — Ambulatory Visit (INDEPENDENT_AMBULATORY_CARE_PROVIDER_SITE_OTHER): Payer: BLUE CROSS/BLUE SHIELD | Admitting: Obstetrics and Gynecology

## 2017-09-02 VITALS — BP 120/75 | HR 86 | Wt 190.8 lb

## 2017-09-02 DIAGNOSIS — E669 Obesity, unspecified: Secondary | ICD-10-CM

## 2017-09-02 DIAGNOSIS — O210 Mild hyperemesis gravidarum: Secondary | ICD-10-CM

## 2017-09-02 DIAGNOSIS — Z3481 Encounter for supervision of other normal pregnancy, first trimester: Secondary | ICD-10-CM

## 2017-09-02 LAB — POCT URINALYSIS DIPSTICK
BILIRUBIN UA: NEGATIVE
Glucose, UA: NEGATIVE
KETONES UA: NEGATIVE
Leukocytes, UA: NEGATIVE
Nitrite, UA: NEGATIVE
PH UA: 6.5 (ref 5.0–8.0)
Protein, UA: POSITIVE — AB
SPEC GRAV UA: 1.025 (ref 1.010–1.025)
UROBILINOGEN UA: 0.2 U/dL

## 2017-09-02 NOTE — Progress Notes (Signed)
ROB-pt is present for NOB PE. Pt stated that she is doing a lot better than last week. Pt stated that she was doing well no concerns.

## 2017-09-02 NOTE — Progress Notes (Signed)
OBSTETRIC INITIAL PRENATAL VISIT  Subjective:    Stephanie Hampton is being seen today for her first obstetrical visit.  This is a planned pregnancy. She is a G2P1001 female at 667w3d gestation, Estimated Date of Delivery: 03/14/18 with Patient's last menstrual period was 06/07/2017 exact date, consistent with 8 week sono. Her obstetrical history is significant for hyperemesis this pregnancy requiring ER visit. Relationship with FOB: spouse, living together. Patient does intend to breast feed. Pregnancy history fully reviewed.    OB History  Gravida Para Term Preterm AB Living  2 1 1  0 0 1  SAB TAB Ectopic Multiple Live Births  0 0 0 0 1    # Outcome Date GA Lbr Len/2nd Weight Sex Delivery Anes PTL Lv  2 Current           1 Term 2014   7 lb 4.8 oz (3.311 kg) F Vag-Spont   LIV    Gynecologic History:  Last [facility] was 12/2015 [facility]per patient[facility]  Results were normal  Denies h/o abnormal pap smaers in the past.  Denies history of STI's.    Past Medical History:  Diagnosis Date  . Depression   . Endometriosis   . Ovarian cyst      Family History  Problem Relation Age of Onset  . Heart disease Father   . Cancer Neg Hx   . Ovarian cancer Neg Hx   . Colon cancer Neg Hx   . Diabetes Neg Hx      Past Surgical History:  Procedure Laterality Date  . LAPAROSCOPIC OVARIAN CYSTECTOMY Left 01/22/2016   Procedure: LAPAROSCOPIC LEFT OVARIAN CYSTECTOMY-POSSIBLE BIOPSIES;  Surgeon: Herold HarmsMartin A Defrancesco, MD;  Location: [facility] ORS;  Service: Gynecology;  Laterality: Left;  excision/fulgeration of endometriosis  . NO PAST SURGERIES       Social History   Socioeconomic History  . Marital status: Married    Spouse name: Not on file  . Number of children: Not on file  . Years of education: Not on file  . Highest education level: Not on file  Occupational History  . Not on file  Social Needs  . Financial resource strain: Not on file  . Food insecurity:    Worry:  Not on file    Inability: Not on file  . Transportation needs:    Medical: Not on file    Non-medical: Not on file  Tobacco Use  . Smoking status: Never Smoker  . Smokeless tobacco: Never Used  Substance and Sexual Activity  . Alcohol use: No    Alcohol/week: 0.0 oz  . Drug use: No  . Sexual activity: Yes    Birth control/protection: None  Lifestyle  . Physical activity:    Days per week: Not on file    Minutes per session: Not on file  . Stress: Not on file  Relationships  . Social connections:    Talks on phone: Not on file    Gets together: Not on file    Attends religious service: Not on file    Active member of club or organization: Not on file    Attends meetings of clubs or organizations: Not on file    Relationship status: Not on file  . Intimate partner violence:    Fear of current or ex partner: Not on file    Emotionally abused: Not on file    Physically abused: Not on file    Forced sexual activity: Not on file  Other  Topics Concern  . Not on file  Social History Narrative  . Not on file     Current Outpatient Medications on File Prior to Visit  Medication Sig Dispense Refill  . ondansetron (ZOFRAN ODT) 4 MG disintegrating tablet Take 2 tablets (8 mg total) by mouth every 8 (eight) hours as needed for nausea or vomiting. 30 tablet 1  . Prenatal Vit-Fe Fumarate-FA (PRENATAL PO) Take 1 tablet by mouth every evening.    . promethazine (PHENERGAN) 12.5 MG suppository Place 12.5 mg rectally every 6 (six) hours as needed for nausea or vomiting.    . promethazine (PHENERGAN) 25 MG tablet Take 1 tablet (25 mg total) by mouth every 6 (six) hours as needed for nausea or vomiting. 30 tablet 2   No current facility-administered medications on file prior to visit.      Allergies  Allergen Reactions  . Augmentin [Amoxicillin-Pot Clavulanate] Hives    Has patient had a PCN reaction causing immediate rash, facial/tongue/throat swelling, SOB or lightheadedness with  hypotension: No Has patient had a PCN reaction causing severe rash involving mucus membranes or skin necrosis: No Has patient had a PCN reaction that required hospitalization: No Has patient had a PCN reaction occurring within the last 10 years: Yes If all of the above answers are "NO", then may proceed with Cephalosporin use.        Review of Systems General:Not Present- Fever, Weight Loss and Weight Gain. Skin:Not Present- Rash. HEENT:Not Present- Blurred Vision, Headache and Bleeding Gums. Respiratory:Not Present- Difficulty Breathing. Breast:Not Present- Breast Mass. Cardiovascular:Not Present- Chest Pain, Elevated Blood Pressure, Fainting / Blacking Out and Shortness of Breath. Gastrointestinal:Not Present- Abdominal Pain, Constipation.  Positive for Nausea and Vomiting (however significantly improved since last visit). Female Genitourinary:Not Present- Frequency, Painful Urination, Pelvic Pain, Vaginal Bleeding, Vaginal Discharge, Contractions, regular, Fetal Movements Decreased, Urinary Complaints and Vaginal Fluid. Musculoskeletal:Not Present- Back Pain and Leg Cramps. Neurological:Not Present- Dizziness. Psychiatric:Not Present- Depression.     Objective:   Blood pressure 120/75, pulse 86, weight 190 lb 12.8 oz (86.5 kg), last menstrual period 06/07/2017.  Body mass index is 32.75 kg/m.   General Appearance:    Alert, cooperative, no distress, appears stated age, mild obesity  Head:    Normocephalic, without obvious abnormality, atraumatic  Eyes:    PERRL, conjunctiva/corneas clear, EOM's intact, both eyes  Ears:    Normal external ear canals, both ears  Nose:   Nares normal, septum midline, mucosa normal, no drainage or sinus tenderness  Throat:   Lips, mucosa, and tongue normal; teeth and gums normal  Neck:   Supple, symmetrical, trachea midline, no adenopathy; thyroid: no enlargement/tenderness/nodules; no carotid bruit or JVD  Back:     Symmetric, no  curvature, ROM normal, no CVA tenderness  Lungs:     Clear to auscultation bilaterally, respirations unlabored  Chest Wall:    No tenderness or deformity   Heart:    Regular rate and rhythm, S1 and S2 normal, no murmur, rub or gallop  Breast Exam:    No tenderness, masses, or nipple abnormality  Abdomen:     Soft, non-tender, bowel sounds active all four quadrants, no masses, no organomegaly.  FH 12.  FHT 158  bpm.  Genitalia:    Pelvic:external genitalia normal, vagina without lesions, discharge, or tenderness, rectovaginal septum  normal. Cervix normal in appearance, no cervical motion tenderness, no adnexal masses or tenderness.  Pregnancy positive findings: uterine enlargement: 12 wk size, nontender.   Rectal:  Normal external sphincter.  No hemorrhoids appreciated. Internal exam not done.   Extremities:   Extremities normal, atraumatic, no cyanosis or edema  Pulses:   2+ and symmetric all extremities  Skin:   Skin color, texture, turgor normal, no rashes or lesions  Lymph nodes:   Cervical, supraclavicular, and axillary nodes normal  Neurologic:   CNII-XII intact, normal strength, sensation and reflexes throughout    Assessment:    Pregnancy at 12 and 3/7 weeks   Hyperemesis Mild obesity  Plan:    Initial labs reviewed. Prenatal vitamins encouraged. Problem list reviewed and updated. New OB counseling:  The patient has been given an overview regarding routine prenatal care.  Recommendations regarding diet, weight gain, and exercise in pregnancy were given. Prenatal testing, optional genetic testing, and ultrasound use in pregnancy were reviewed. 1st trimester, second trimester, and cell-free DNA screening discussed: undecided on which testing desired. Desires to check with insurance company to see what may be covered. Will inform by next visit, or will call office to schedule 1st trimester screen next week if this is testing desired.  Benefits of Breast Feeding were discussed. The  patient is encouraged to consider nursing her baby post partum. Continue Phenergan/Zofran scheduled for next several days, then advised to attempt weaning to prn.  Follow up in 4 weeks.  50% of 30 min visit spent on counseling and coordination of care.    Hildred Laser, MD [facility]

## 2017-09-10 ENCOUNTER — Other Ambulatory Visit: Payer: BLUE CROSS/BLUE SHIELD

## 2017-09-10 DIAGNOSIS — Z3482 Encounter for supervision of other normal pregnancy, second trimester: Secondary | ICD-10-CM | POA: Diagnosis not present

## 2017-09-24 ENCOUNTER — Telehealth: Payer: Self-pay | Admitting: Obstetrics and Gynecology

## 2017-09-24 NOTE — Telephone Encounter (Signed)
Patient called stating she cant keep anything down. She is already alternating zofran and phenergan without any relief. Please Advise

## 2017-09-25 ENCOUNTER — Other Ambulatory Visit: Payer: Self-pay

## 2017-09-25 NOTE — Telephone Encounter (Signed)
Pt called; LM to call the office.

## 2017-09-26 NOTE — Telephone Encounter (Signed)
Sent the pt a mychart message to see how she was doing.

## 2017-09-26 NOTE — Telephone Encounter (Signed)
Pt was called no answer LM stating being concerned about her health and requested that pt cal the office.

## 2017-10-01 ENCOUNTER — Encounter: Payer: Self-pay | Admitting: Obstetrics and Gynecology

## 2017-10-01 ENCOUNTER — Other Ambulatory Visit: Payer: Self-pay

## 2017-10-01 ENCOUNTER — Encounter: Payer: BLUE CROSS/BLUE SHIELD | Admitting: Obstetrics and Gynecology

## 2017-10-01 MED ORDER — ONDANSETRON 4 MG PO TBDP: 8.0000 mg | ORAL_TABLET | Freq: Three times a day (TID) | ORAL | 1 refills | Status: DC | PRN

## 2017-10-06 ENCOUNTER — Emergency Department
Admission: EM | Admit: 2017-10-06 | Discharge: 2017-10-06 | Disposition: A | Discharge status: Home / Self Care | Payer: BLUE CROSS/BLUE SHIELD | Attending: Student in an Organized Health Care Education/Training Program | Admitting: Student in an Organized Health Care Education/Training Program

## 2017-10-06 ENCOUNTER — Other Ambulatory Visit: Payer: Self-pay

## 2017-10-06 ENCOUNTER — Encounter: Payer: Self-pay | Admitting: *Deleted

## 2017-10-06 DIAGNOSIS — R519 Headache, unspecified: Secondary | ICD-10-CM

## 2017-10-06 DIAGNOSIS — R51 Headache: Secondary | ICD-10-CM | POA: Insufficient documentation

## 2017-10-06 DIAGNOSIS — O9989 Other specified diseases and conditions complicating pregnancy, childbirth and the puerperium: Secondary | ICD-10-CM | POA: Insufficient documentation

## 2017-10-06 DIAGNOSIS — Z3A17 17 weeks gestation of pregnancy: Secondary | ICD-10-CM | POA: Insufficient documentation

## 2017-10-06 LAB — URINALYSIS, COMPLETE (UACMP) WITH MICROSCOPIC
BILIRUBIN URINE: NEGATIVE
Glucose, UA: NEGATIVE mg/dL
Hgb urine dipstick: NEGATIVE
KETONES UR: NEGATIVE mg/dL
Leukocytes, UA: NEGATIVE
NITRITE: NEGATIVE
PROTEIN: NEGATIVE mg/dL
Specific Gravity, Urine: 1.006 (ref 1.005–1.030)
pH: 8 (ref 5.0–8.0)

## 2017-10-06 MED ORDER — DEXTROSE 5 % AND 0.45 % NACL IV BOLUS
1000.0000 mL | Freq: Once | INTRAVENOUS | Status: DC
  Filled 2017-10-06: qty 1000

## 2017-10-06 MED ORDER — MAGNESIUM SULFATE 2 GM/50ML IV SOLN
2.0000 g | Freq: Once | INTRAVENOUS | Status: AC
  Administered 2017-10-06: 2 g via INTRAVENOUS
  Filled 2017-10-06 (×2): qty 50

## 2017-10-06 MED ORDER — ACETAMINOPHEN 500 MG PO TABS
1000.0000 mg | ORAL_TABLET | Freq: Once | ORAL | Status: AC
  Administered 2017-10-06: 1000 mg via ORAL
  Filled 2017-10-06: qty 2

## 2017-10-06 MED ORDER — METOCLOPRAMIDE HCL 5 MG/ML IJ SOLN
10.0000 mg | Freq: Once | INTRAMUSCULAR | Status: AC
  Administered 2017-10-06: 10 mg via INTRAVENOUS
  Filled 2017-10-06: qty 2

## 2017-10-06 MED ORDER — METOCLOPRAMIDE HCL 10 MG PO TABS: 10.0000 mg | ORAL_TABLET | Freq: Four times a day (QID) | ORAL | 0 refills | Status: DC | PRN

## 2017-10-06 MED ORDER — SODIUM CHLORIDE 0.9 % IV BOLUS
1000.0000 mL | Freq: Once | INTRAVENOUS | Status: AC
  Administered 2017-10-06: 1000 mL via INTRAVENOUS

## 2017-10-06 NOTE — ED Notes (Signed)
FHT [id number]

## 2017-10-06 NOTE — ED Notes (Signed)
First Nurse Note:  Patient presents to the ED with headache, dizziness and blurry vision at [redacted] weeks pregnant.  Patient was instructed to come to the ED by her OB-GYN.  Patient is alert and oriented x 4.  No obvious distress at this time.

## 2017-10-06 NOTE — Discharge Instructions (Addendum)

## 2017-10-06 NOTE — ED Triage Notes (Signed)
Pt to ED reporting headache that started two days, no relief with tylenol. nausea present but responded to Zofran at home. Intermittently blurred vision with light sensitivity and noise sensitivity. No neuro deficits noted.   Hyperemesis with the pregnancy but no other complications.

## 2017-10-06 NOTE — ED Provider Notes (Signed)
[facility] Emergency Department Provider Note    First MD Initiated Contact with Patient 10/06/17 1416     (approximate)  I have reviewed the triage vital signs and the nursing notes.   HISTORY  Chief Complaint Headache    HPI Stephanie Hampton is a 22 y.o. female history of depression endometriosis who is roughly [redacted] weeks pregnant by LMP presents to the ER with 2 days of frontal headache.  States she has a history of headaches but none that lasted this long.  States she was typically able to take [facility] powder or [employer] powder with improvement in symptoms but now she is pregnant she is tried Tylenol without any improvement.  States she was having nausea earlier today and took Zofran which relieved that.  No numbness tingling or weakness.  It was not sudden in onset but gradual over the past 24 hours.  No fevers.  No neck stiffness.  States that similar but more pronounced than her previous headaches.  No lower extremity swelling.  Does endorse photophobia and phonophobia.    Past Medical History:  Diagnosis Date  . Depression   . Endometriosis   . Ovarian cyst    Family History  Problem Relation Age of Onset  . Heart disease Father   . Cancer Neg Hx   . Ovarian cancer Neg Hx   . Colon cancer Neg Hx   . Diabetes Neg Hx    Past Surgical History:  Procedure Laterality Date  . LAPAROSCOPIC OVARIAN CYSTECTOMY Left 01/22/2016   Procedure: LAPAROSCOPIC LEFT OVARIAN CYSTECTOMY-POSSIBLE BIOPSIES;  Surgeon: Herold Harms, MD;  Location: [facility] ORS;  Service: Gynecology;  Laterality: Left;  excision/fulgeration of endometriosis  . NO PAST SURGERIES     Patient Active Problem List   Diagnosis Date Noted  . Hyperemesis affecting pregnancy, antepartum 08/08/2017  . Positive urine pregnancy test 07/09/2017  . BMI 33.0-33.9,adult 07/09/2017  . Hx of migraine headaches 07/09/2017  . Endometriosis determined by laparoscopy 01/30/2016  . Pelvic pain 01/17/2016        Prior to Admission medications   Medication Sig Start Date End Date Taking? Authorizing Provider  metoCLOPramide (REGLAN) 10 MG tablet Take 1 tablet (10 mg total) by mouth every 6 (six) hours as needed for nausea. 10/06/17 10/06/18  Willy Eddy, MD  ondansetron (ZOFRAN ODT) 4 MG disintegrating tablet Take 2 tablets (8 mg total) by mouth every 8 (eight) hours as needed for nausea or vomiting. 10/01/17   Defrancesco, Prentice Docker, MD  Prenatal Vit-Fe Fumarate-FA (PRENATAL PO) Take 1 tablet by mouth every evening.    [provider]  promethazine (PHENERGAN) 12.5 MG suppository Place 12.5 mg rectally every 6 (six) hours as needed for nausea or vomiting.    [provider]  promethazine (PHENERGAN) 25 MG tablet Take 1 tablet (25 mg total) by mouth every 6 (six) hours as needed for nausea or vomiting. 08/28/17   Hildred Laser, MD    Allergies Augmentin [amoxicillin-pot clavulanate]    Social History Social History   Tobacco Use  . Smoking status: Never Smoker  . Smokeless tobacco: Never Used  Substance Use Topics  . Alcohol use: No    Alcohol/week: 0.0 oz  . Drug use: No    Review of Systems Patient denies headaches, rhinorrhea, blurry vision, numbness, shortness of breath, chest pain, edema, cough, abdominal pain, nausea, vomiting, diarrhea, dysuria, fevers, rashes or hallucinations unless otherwise stated above in HPI. ____________________________________________   PHYSICAL EXAM:  VITAL SIGNS:  Vitals:   10/06/17 1329  BP: 120/66  Pulse: (!) 105  Resp: 18  Temp: 99.3 F (37.4 C)  SpO2: 97%    Constitutional: Alert and oriented.  Eyes: Conjunctivae are normal.  Head: Atraumatic. Nose: No congestion/rhinnorhea. Mouth/Throat: Mucous membranes are moist.   Neck: No stridor. Painless ROM.  Cardiovascular: Normal rate, regular rhythm. Grossly normal heart sounds.  Good peripheral circulation. Respiratory: Normal respiratory effort.  No retractions.  Lungs CTAB. Gastrointestinal: Soft and nontender. No distention. No abdominal bruits. No CVA tenderness. Genitourinary: deferred Musculoskeletal: No lower extremity tenderness nor edema.  No joint effusions. Neurologic:  CN- intact.  No facial droop, Normal FNF.  Normal heel to shin.  Sensation intact bilaterally. Normal speech and language. No gross focal neurologic deficits are appreciated. No gait instability. Skin:  Skin is warm, dry and intact. No rash noted. Psychiatric: Mood and affect are normal. Speech and behavior are normal.  ____________________________________________   LABS (all labs ordered are listed, but only abnormal results are displayed)  Results for orders placed or performed during the hospital encounter of 10/06/17 (from the past 24 hour(s))  Urinalysis, Complete w Microscopic     Status: Abnormal   Collection Time: 10/06/17  3:03 PM  Result Value Ref Range   Color, Urine STRAW (A) YELLOW   APPearance CLEAR (A) CLEAR   Specific Gravity, Urine 1.006 1.005 - 1.030   pH 8.0 5.0 - 8.0   Glucose, UA NEGATIVE NEGATIVE mg/dL   Hgb urine dipstick NEGATIVE NEGATIVE   Bilirubin Urine NEGATIVE NEGATIVE   Ketones, ur NEGATIVE NEGATIVE mg/dL   Protein, ur NEGATIVE NEGATIVE mg/dL   Nitrite NEGATIVE NEGATIVE   Leukocytes, UA NEGATIVE NEGATIVE   ____________________________________________ ____________________________________________  RADIOLOGY   ____________________________________________   PROCEDURES  Procedure(s) performed:  Procedures    Critical Care performed: no ____________________________________________   INITIAL IMPRESSION / ASSESSMENT AND PLAN / ED COURSE  Pertinent labs & imaging results that were available during my care of the patient were reviewed by me and considered in my medical decision making (see chart for details).   DDX: migraine, dehydration, tension, cluster, sinusitis, meningitis, CVT  Stephanie Hampton is a 22 y.o. who presents  to the ED with mild to moderate headache as described above.  She is well-appearing afebrile hemodynamically stable.  Presentation is comp gated by pregnancy state but she has no focal neuro deficits.  No signs or symptoms to suggest meningitis or infectious process.  I do suspect some component of dehydration.  Will provide IV medication as well as IV fluids and reassess.  Did consider pathology such as CVT as it is slightly increased risk in pregnancy but she has no focal neuro deficits since headache is similar to previous.  Not clinically consistent with SAH or IPH.  No trauma.  Clinical Course as of Oct 06 1644  Mon Oct 06, 2017  1620 Patient reassessed.  Headache is resolved.  Seems less clinically consistent with CVT as would not expect this to resolve particular without any focal neuro deficits.  Seems more clinically consistent with possible component of mild dehydration decreased p.o. intake and the status of being pregnant.  Patient requesting discharge home.  Informed her that we still considered possibilities and differential including CVT but I think is highly unlikely clinically based on her presentation.  Informed her that should she have recurrence of her headache that she should return immediately to the ER for reevaluation.  Patient was able to tolerate PO and was able  to ambulate with a steady gait.  Have discussed with the patient and available family all diagnostics and treatments performed thus far and all questions were answered to the best of my ability. The patient demonstrates understanding and agreement with plan.    [PR]    Clinical Course User Index [PR] Willy Eddy, MD     As part of my medical decision making, I reviewed the following data within the electronic MEDICAL RECORD NUMBER Nursing notes reviewed and incorporated, Labs reviewed, notes from prior ED visits.   ____________________________________________   FINAL CLINICAL IMPRESSION(S) / ED DIAGNOSES  Final  diagnoses:  Bad headache      NEW MEDICATIONS STARTED DURING THIS VISIT:  Discharge Medication List as of 10/06/2017  4:23 PM    START taking these medications   Details  metoCLOPramide (REGLAN) 10 MG tablet Take 1 tablet (10 mg total) by mouth every 6 (six) hours as needed for nausea., Starting Mon 10/06/2017, Until Tue 10/06/2018, Print         Note:  This document was prepared using Dragon voice recognition software and may include unintentional dictation errors.    Willy Eddy, MD 10/06/17 [phone]

## 2017-10-07 ENCOUNTER — Ambulatory Visit (INDEPENDENT_AMBULATORY_CARE_PROVIDER_SITE_OTHER): Payer: BLUE CROSS/BLUE SHIELD | Admitting: Obstetrics and Gynecology

## 2017-10-07 ENCOUNTER — Encounter: Payer: Self-pay | Admitting: Obstetrics and Gynecology

## 2017-10-07 VITALS — BP 105/69 | HR 103 | Wt 193.7 lb

## 2017-10-07 DIAGNOSIS — Z3482 Encounter for supervision of other normal pregnancy, second trimester: Secondary | ICD-10-CM | POA: Diagnosis not present

## 2017-10-07 LAB — POCT URINALYSIS DIPSTICK
BILIRUBIN UA: NEGATIVE
Blood, UA: NEGATIVE
Glucose, UA: NEGATIVE
KETONES UA: NEGATIVE
Leukocytes, UA: NEGATIVE
Nitrite, UA: NEGATIVE
ODOR: NEGATIVE
Protein, UA: NEGATIVE
UROBILINOGEN UA: 0.2 U/dL
pH, UA: 8 (ref 5.0–8.0)

## 2017-10-07 NOTE — Progress Notes (Signed)
Rob- seen in ed yesterday- H/a. Given tylenol, reglan, and fluids- better today.

## 2017-10-07 NOTE — Progress Notes (Signed)
ROB: Complains of occasional headaches (seen in ED yesterday).  Discussed hydration vaporizer at night allergy medication etc.  Patient considering AFP-declined today will consider by next visit.  FAS with next visit.

## 2017-10-08 ENCOUNTER — Encounter: Payer: BLUE CROSS/BLUE SHIELD | Admitting: Obstetrics and Gynecology

## 2017-10-13 ENCOUNTER — Telehealth: Payer: Self-pay | Admitting: Obstetrics and Gynecology

## 2017-10-13 NOTE — Telephone Encounter (Signed)
Pt's husband is making her appointment to be seen by the doctor's due to her having headaches, dizziness, and feeling weak.

## 2017-10-13 NOTE — Telephone Encounter (Signed)
Patients husband called stating the patient is feeling very dizzy to the point she fainted. He stated she has had a bad headache and low BP. Please Advise. Thanks

## 2017-10-13 NOTE — Telephone Encounter (Signed)
Pt was called and she was asleep. Spoke with pt's husband. He stated that she was drinking fliud, eating and was sitting down when she fainted and passed out. Pt stated that her heart has been raising.

## 2017-10-14 ENCOUNTER — Encounter: Payer: Self-pay | Admitting: Obstetrics and Gynecology

## 2017-10-14 ENCOUNTER — Ambulatory Visit (INDEPENDENT_AMBULATORY_CARE_PROVIDER_SITE_OTHER): Payer: BLUE CROSS/BLUE SHIELD | Admitting: Obstetrics and Gynecology

## 2017-10-14 VITALS — BP 104/69 | HR 114 | Wt 194.4 lb

## 2017-10-14 DIAGNOSIS — R110 Nausea: Secondary | ICD-10-CM

## 2017-10-14 DIAGNOSIS — Z3482 Encounter for supervision of other normal pregnancy, second trimester: Secondary | ICD-10-CM

## 2017-10-14 DIAGNOSIS — R7989 Other specified abnormal findings of blood chemistry: Secondary | ICD-10-CM | POA: Diagnosis not present

## 2017-10-14 DIAGNOSIS — R51 Headache: Secondary | ICD-10-CM

## 2017-10-14 DIAGNOSIS — R519 Headache, unspecified: Secondary | ICD-10-CM

## 2017-10-14 DIAGNOSIS — R55 Syncope and collapse: Secondary | ICD-10-CM | POA: Diagnosis not present

## 2017-10-14 LAB — POCT URINALYSIS DIPSTICK
Bilirubin, UA: NEGATIVE
Blood, UA: NEGATIVE
GLUCOSE UA: NEGATIVE
Ketones, UA: NEGATIVE
LEUKOCYTES UA: NEGATIVE
NITRITE UA: NEGATIVE
Protein, UA: POSITIVE — AB
SPEC GRAV UA: 1.01 (ref 1.010–1.025)
Urobilinogen, UA: 0.2 E.U./dL
pH, UA: 8 (ref 5.0–8.0)

## 2017-10-14 MED ORDER — BUTALBITAL-APAP-CAFFEINE 50-325-40 MG PO CAPS: 1.0000 | ORAL_CAPSULE | Freq: Four times a day (QID) | ORAL | 3 refills | Status: DC | PRN

## 2017-10-14 NOTE — Patient Instructions (Signed)
Syncope Syncope is when you temporarily lose consciousness. Syncope may also be called fainting or passing out. It is caused by a sudden decrease in blood flow to the brain. Even though most causes of syncope are not dangerous, syncope can be a sign of a serious medical problem. Signs that you may be about to faint include:  Feeling dizzy or light-headed.  Feeling nauseous.  Seeing all white or all black in your field of vision.  Having cold, clammy skin.  If you fainted, get medical help right away.Call your local emergency services (911 in the [place] Do not drive yourself to the hospital. Follow these instructions at home: Pay attention to any changes in your symptoms. Take these actions to help with your condition:  Have someone stay with you until you feel stable.  Do not drive, use machinery, or play sports until your health care provider says it is okay.  Keep all follow-up visits as told by your health care provider. This is important.  If you start to feel like you might faint, lie down right away and raise (elevate) your feet above the level of your heart. Breathe deeply and steadily. Wait until all of the symptoms have passed.  Drink enough fluid to keep your urine clear or pale yellow.  If you are taking blood pressure or heart medicine, get up slowly and take several minutes to sit and then stand. This can reduce dizziness.  Take over-the-counter and prescription medicines only as told by your health care provider.  Get help right away if:  You have a severe headache.  You have unusual pain in your chest, abdomen, or back.  You are bleeding from your mouth or rectum, or you have black or tarry stool.  You have a very fast or irregular heartbeat (palpitations).  You have pain with breathing.  You faint once or repeatedly.  You have a seizure.  You are confused.  You have trouble walking.  You have severe weakness.  You have vision problems. These  symptoms may represent a serious problem that is an emergency. Do not wait to see if your symptoms will go away. Get medical help right away. Call your local emergency services (911 in the [place] Do not drive yourself to the hospital. This information is not intended to replace advice given to you by your health care provider. Make sure you discuss any questions you have with your health care provider. Document Released: 03/18/2005 Document Revised: 08/24/2015 Document Reviewed: 11/30/2014 Elsevier Interactive Patient Education  2018 Elsevier Inc.  

## 2017-10-14 NOTE — Progress Notes (Signed)
Problem OB: Patient c/o fainting at work yesterday. States she felt light-headed and dizzy.  Has approximately 2-3 episodes (near syncope) per week.  She is also having really bad headaches (usally associated with the fainting spells). Vomiting has improved but is still having some nausea. Was given Reglan in the ER last week for similar symptoms and migraine, but notes it makes her feel very anxious. Advised to discontinue Reglan, will prescribe Fioricet. Letter provided for work to allow for more frequent breaks. Advised on continuing to increase hydration as much as possible.

## 2017-10-14 NOTE — Progress Notes (Signed)
Pt stated that she fainted at work yesterday, pt stated feeling lightheaded an dizzy. Pt stated having really bad headaches. Pt stated that she is still getting nausea but have not vomited.

## 2017-10-15 ENCOUNTER — Encounter: Payer: BLUE CROSS/BLUE SHIELD | Admitting: Obstetrics and Gynecology

## 2017-10-15 ENCOUNTER — Other Ambulatory Visit: Payer: Self-pay | Admitting: Obstetrics and Gynecology

## 2017-10-15 DIAGNOSIS — Z3689 Encounter for other specified antenatal screening: Secondary | ICD-10-CM

## 2017-10-15 LAB — COMPREHENSIVE METABOLIC PANEL
ALBUMIN: 4 g/dL (ref 3.5–5.5)
ALT: 20 IU/L (ref 0–32)
AST: 15 IU/L (ref 0–40)
Albumin/Globulin Ratio: 1.6 (ref 1.2–2.2)
Alkaline Phosphatase: 97 IU/L (ref 39–117)
BUN / CREAT RATIO: 15 (ref 9–23)
BUN: 6 mg/dL (ref 6–20)
Bilirubin Total: <0.2 mg/dL (ref 0.0–1.2)
CALCIUM: 9.5 mg/dL (ref 8.7–10.2)
CHLORIDE: 102 mmol/L (ref 96–106)
CO2: 16 mmol/L — ABNORMAL LOW (ref 20–29)
Creatinine, Ser: 0.39 mg/dL — ABNORMAL LOW (ref 0.57–1.00)
GFR, EST AFRICAN AMERICAN: 171 mL/min/{1.73_m2} (ref >59)
GFR, EST NON AFRICAN AMERICAN: 149 mL/min/{1.73_m2} (ref >59)
GLUCOSE: 87 mg/dL (ref 65–99)
Globulin, Total: 2.5 g/dL (ref 1.5–4.5)
Potassium: 4.1 mmol/L (ref 3.5–5.2)
Sodium: 136 mmol/L (ref 134–144)
TOTAL PROTEIN: 6.5 g/dL (ref 6.0–8.5)

## 2017-10-15 LAB — THYROID PANEL WITH TSH
Free Thyroxine Index: 1.4 (ref 1.2–4.9)
T3 UPTAKE RATIO: 15 % — AB (ref 24–39)
T4, Total: 9.6 ug/dL (ref 4.5–12.0)
TSH: 0.947 u[IU]/mL (ref 0.450–4.500)

## 2017-10-16 ENCOUNTER — Telehealth: Payer: Self-pay | Admitting: Obstetrics and Gynecology

## 2017-10-16 ENCOUNTER — Ambulatory Visit (INDEPENDENT_AMBULATORY_CARE_PROVIDER_SITE_OTHER): Payer: BLUE CROSS/BLUE SHIELD | Admitting: Obstetrics and Gynecology

## 2017-10-16 ENCOUNTER — Encounter: Payer: Self-pay | Admitting: Obstetrics and Gynecology

## 2017-10-16 VITALS — BP 107/72 | HR 94 | Wt 192.7 lb

## 2017-10-16 DIAGNOSIS — Z3A18 18 weeks gestation of pregnancy: Secondary | ICD-10-CM

## 2017-10-16 DIAGNOSIS — R42 Dizziness and giddiness: Secondary | ICD-10-CM

## 2017-10-16 DIAGNOSIS — O9989 Other specified diseases and conditions complicating pregnancy, childbirth and the puerperium: Secondary | ICD-10-CM

## 2017-10-16 DIAGNOSIS — Z3482 Encounter for supervision of other normal pregnancy, second trimester: Secondary | ICD-10-CM

## 2017-10-16 LAB — POCT URINALYSIS DIPSTICK
BILIRUBIN UA: NEGATIVE
Blood, UA: NEGATIVE
Glucose, UA: NEGATIVE
Ketones, UA: 40
Leukocytes, UA: NEGATIVE
Nitrite, UA: NEGATIVE
PH UA: 6 (ref 5.0–8.0)
Protein, UA: NEGATIVE
Spec Grav, UA: 1.01 (ref 1.010–1.025)
UROBILINOGEN UA: 0.2 U/dL

## 2017-10-16 NOTE — Progress Notes (Signed)
Pt states feeling very dizzy, and warm with a ringing in her ears, then fainting/blacking out.

## 2017-10-16 NOTE — Telephone Encounter (Signed)
Pt was called and she stated that she was sitting down trying to get dress for work she was having fainting spellings one after the other. Pt stated that she was unable to go to work due to the spellings. Pt has an appt this evening at 4pm with DJE.

## 2017-10-16 NOTE — Telephone Encounter (Signed)
Patient called stating she has be experiencing frequent fainting spells and didn't know if there was anything she could do. She is 18 wk 5 days.Thanks

## 2017-10-16 NOTE — Progress Notes (Signed)
ROB: Patient complains of lightheaded episodes especially in the morning.  She says that she feels her heart beating fast and becomes lightheaded.  If she lies back down in bed this resolves.  Happens often when she first wakes in the morning and could happen several times in a row.  Also happens at other times a day but not as frequently.  She believes that these episodes are occurring much more frequently.  She has not lost consciousness or fallen when this occurs. We understandably have discussed this in great detail.  Hypoglycemia and low blood pressure secondary to her persistent nausea and vomiting and dehydration is a possibility.  Patient will do better about remaining hydrated and attempt to eat something all day long.  If this does not help we will consider cardiology consult.  Strongly doubt seizure activity after discussing this in detail with the patient.

## 2017-10-17 ENCOUNTER — Encounter: Payer: Self-pay | Admitting: Obstetrics and Gynecology

## 2017-10-20 ENCOUNTER — Encounter: Payer: Self-pay | Admitting: Obstetrics and Gynecology

## 2017-10-21 NOTE — Addendum Note (Signed)
Addended by: Elonda HuskyEVANS, Aedin Jeansonne J on: 10/21/2017 10:26 AM   Modules accepted: Orders

## 2017-10-27 DIAGNOSIS — R55 Syncope and collapse: Secondary | ICD-10-CM | POA: Diagnosis not present

## 2017-10-27 DIAGNOSIS — Z3A00 Weeks of gestation of pregnancy not specified: Secondary | ICD-10-CM | POA: Diagnosis not present

## 2017-10-27 DIAGNOSIS — Z8249 Family history of ischemic heart disease and other diseases of the circulatory system: Secondary | ICD-10-CM | POA: Diagnosis not present

## 2017-10-27 DIAGNOSIS — Z3490 Encounter for supervision of normal pregnancy, unspecified, unspecified trimester: Secondary | ICD-10-CM | POA: Diagnosis not present

## 2017-10-27 DIAGNOSIS — R000 Tachycardia, unspecified: Secondary | ICD-10-CM | POA: Diagnosis not present

## 2017-10-27 DIAGNOSIS — O26892 Other specified pregnancy related conditions, second trimester: Secondary | ICD-10-CM | POA: Diagnosis not present

## 2017-10-28 ENCOUNTER — Ambulatory Visit: Payer: BLUE CROSS/BLUE SHIELD | Admitting: Cardiovascular Disease

## 2017-10-28 ENCOUNTER — Encounter

## 2017-10-29 ENCOUNTER — Ambulatory Visit (INDEPENDENT_AMBULATORY_CARE_PROVIDER_SITE_OTHER): Payer: BLUE CROSS/BLUE SHIELD | Admitting: Obstetrics and Gynecology

## 2017-10-29 ENCOUNTER — Ambulatory Visit (INDEPENDENT_AMBULATORY_CARE_PROVIDER_SITE_OTHER): Payer: BLUE CROSS/BLUE SHIELD

## 2017-10-29 VITALS — BP 98/65 | HR 90 | Wt 195.1 lb

## 2017-10-29 DIAGNOSIS — Z363 Encounter for antenatal screening for malformations: Secondary | ICD-10-CM | POA: Diagnosis not present

## 2017-10-29 DIAGNOSIS — O9989 Other specified diseases and conditions complicating pregnancy, childbirth and the puerperium: Secondary | ICD-10-CM

## 2017-10-29 DIAGNOSIS — Z3A20 20 weeks gestation of pregnancy: Secondary | ICD-10-CM

## 2017-10-29 DIAGNOSIS — R55 Syncope and collapse: Secondary | ICD-10-CM

## 2017-10-29 DIAGNOSIS — Z3482 Encounter for supervision of other normal pregnancy, second trimester: Secondary | ICD-10-CM

## 2017-10-29 DIAGNOSIS — Z3689 Encounter for other specified antenatal screening: Secondary | ICD-10-CM

## 2017-10-29 LAB — POCT URINALYSIS DIPSTICK
Bilirubin, UA: NEGATIVE
Glucose, UA: NEGATIVE
KETONES UA: NEGATIVE
Leukocytes, UA: NEGATIVE
NITRITE UA: NEGATIVE
PROTEIN UA: NEGATIVE
RBC UA: NEGATIVE
SPEC GRAV UA: 1.01 (ref 1.010–1.025)
Urobilinogen, UA: 0.2 E.U./dL
pH, UA: 7.5 (ref 5.0–8.0)

## 2017-10-29 NOTE — Progress Notes (Signed)
ROB: Pt stated that she is doing well, no major complaints. Is currently wearing Holter monitor after Cardiology consultation for recent syncopal episodes. Has recently had fetal echo performed. Will await for records of results. S/p anatomy scan today, incomplete, will need to return in 1-2 weeks for f/u scan.  RTC in 4 weeks for OB visit.

## 2017-11-02 ENCOUNTER — Other Ambulatory Visit: Payer: Self-pay

## 2017-11-02 ENCOUNTER — Emergency Department: Payer: BLUE CROSS/BLUE SHIELD

## 2017-11-02 ENCOUNTER — Observation Stay
Admission: EM | Admit: 2017-11-02 | Discharge: 2017-11-02 | Disposition: A | Discharge status: Home / Self Care | Payer: BLUE CROSS/BLUE SHIELD | Attending: Obstetrics and Gynecology | Admitting: Obstetrics and Gynecology

## 2017-11-02 DIAGNOSIS — K5900 Constipation, unspecified: Secondary | ICD-10-CM | POA: Diagnosis not present

## 2017-11-02 DIAGNOSIS — J9811 Atelectasis: Secondary | ICD-10-CM | POA: Diagnosis not present

## 2017-11-02 DIAGNOSIS — O99612 Diseases of the digestive system complicating pregnancy, second trimester: Secondary | ICD-10-CM | POA: Diagnosis not present

## 2017-11-02 DIAGNOSIS — Z880 Allergy status to penicillin: Secondary | ICD-10-CM | POA: Insufficient documentation

## 2017-11-02 DIAGNOSIS — O9989 Other specified diseases and conditions complicating pregnancy, childbirth and the puerperium: Principal | ICD-10-CM | POA: Insufficient documentation

## 2017-11-02 DIAGNOSIS — R55 Syncope and collapse: Secondary | ICD-10-CM | POA: Diagnosis not present

## 2017-11-02 DIAGNOSIS — Z3A21 21 weeks gestation of pregnancy: Secondary | ICD-10-CM | POA: Diagnosis not present

## 2017-11-02 DIAGNOSIS — O2690 Pregnancy related conditions, unspecified, unspecified trimester: Secondary | ICD-10-CM | POA: Diagnosis not present

## 2017-11-02 DIAGNOSIS — R109 Unspecified abdominal pain: Secondary | ICD-10-CM | POA: Diagnosis not present

## 2017-11-02 DIAGNOSIS — O368120 Decreased fetal movements, second trimester, not applicable or unspecified: Secondary | ICD-10-CM | POA: Insufficient documentation

## 2017-11-02 DIAGNOSIS — R1030 Lower abdominal pain, unspecified: Secondary | ICD-10-CM | POA: Diagnosis not present

## 2017-11-02 DIAGNOSIS — O26893 Other specified pregnancy related conditions, third trimester: Secondary | ICD-10-CM | POA: Insufficient documentation

## 2017-11-02 DIAGNOSIS — R824 Acetonuria: Secondary | ICD-10-CM | POA: Diagnosis not present

## 2017-11-02 LAB — URINALYSIS, COMPLETE (UACMP) WITH MICROSCOPIC
BILIRUBIN URINE: NEGATIVE
Glucose, UA: NEGATIVE mg/dL
Hgb urine dipstick: NEGATIVE
Ketones, ur: 5 mg/dL — AB
LEUKOCYTES UA: NEGATIVE
NITRITE: NEGATIVE
PH: 5 (ref 5.0–8.0)
Protein, ur: NEGATIVE mg/dL
Specific Gravity, Urine: 1.017 (ref 1.005–1.030)

## 2017-11-02 LAB — BASIC METABOLIC PANEL
ANION GAP: 10 (ref 5–15)
BUN: 8 mg/dL (ref 6–20)
CO2: 20 mmol/L — ABNORMAL LOW (ref 22–32)
Calcium: 9.2 mg/dL (ref 8.9–10.3)
Chloride: 106 mmol/L (ref 98–111)
Creatinine, Ser: 0.35 mg/dL — ABNORMAL LOW (ref 0.44–1.00)
GFR calc Af Amer: >60 mL/min (ref >60)
Glucose, Bld: 93 mg/dL (ref 70–99)
Potassium: 4.5 mmol/L (ref 3.5–5.1)
Sodium: 136 mmol/L (ref 135–145)

## 2017-11-02 LAB — CBC WITH DIFFERENTIAL/PLATELET
BASOS ABS: 0 10*3/uL (ref 0–0.1)
Basophils Relative: 0 %
EOS PCT: 1 %
Eosinophils Absolute: 0.2 10*3/uL (ref 0–0.7)
HEMATOCRIT: 40.2 % (ref 35.0–47.0)
Hemoglobin: 13.9 g/dL (ref 12.0–16.0)
Lymphocytes Relative: 10 %
Lymphs Abs: 1.9 10*3/uL (ref 1.0–3.6)
MCH: 31 pg (ref 26.0–34.0)
MCHC: 34.5 g/dL (ref 32.0–36.0)
MCV: 89.8 fL (ref 80.0–100.0)
MONO ABS: 1 10*3/uL — AB (ref 0.2–0.9)
MONOS PCT: 5 %
Neutro Abs: 15.9 10*3/uL — ABNORMAL HIGH (ref 1.4–6.5)
Neutrophils Relative %: 84 %
PLATELETS: 210 10*3/uL (ref 150–440)
RBC: 4.48 MIL/uL (ref 3.80–5.20)
RDW: 14.6 % — AB (ref 11.5–14.5)
WBC: 19 10*3/uL — ABNORMAL HIGH (ref 3.6–11.0)

## 2017-11-02 LAB — TROPONIN I: Troponin I: <0.03 ng/mL (ref <0.03)

## 2017-11-02 MED ORDER — DOCUSATE SODIUM 100 MG PO CAPS: 100.0000 mg | ORAL_CAPSULE | Freq: Every day | ORAL | 2 refills | Status: DC

## 2017-11-02 MED ORDER — LACTATED RINGERS IV BOLUS
500.0000 mL | Freq: Once | INTRAVENOUS | Status: AC
  Administered 2017-11-02: 500 mL via INTRAVENOUS

## 2017-11-02 MED ORDER — FLEET ENEMA 7-19 GM/118ML RE ENEM
1.0000 | ENEMA | Freq: Once | RECTAL | Status: AC
  Administered 2017-11-02: 1 via RECTAL

## 2017-11-02 NOTE — Discharge Instructions (Addendum)
Please seek medical attention for any high fevers, chest pain, shortness of breath, change in behavior, persistent vomiting, bloody stool or any other new or concerning symptoms.  

## 2017-11-02 NOTE — ED Triage Notes (Signed)
Pt felt dizzy like she was going to pass out. Pt is [redacted] weeks pregnant. Pt report having contractions that began prior to the dizziness. Per EMS they are 8 minutes apart.

## 2017-11-02 NOTE — ED Provider Notes (Signed)
[facility] Emergency Department Provider Note  ____________________________________________   I have reviewed the triage vital signs and the nursing notes.   HISTORY  Chief Complaint Contractions  History limited by: Not Limited   HPI Stephanie Hampton is a 22 y.o. female who presents to the emergency department today with primary complaint of contractions.  She stated they started today.  Happening every 5 to 8 minutes.  She has not noticed any abnormal vaginal discharge or bleeding.  She does feel like she has not felt as much fetal movement today as normal.  In addition the patient did have a near syncopal episodes.  This has been occurring throughout this pregnancy and the patient has seen cardiology for.   Per medical record review patient has a history of cardiology appointment last week for syncopal episodes.  Past Medical History:  Diagnosis Date  . Depression   . Endometriosis   . Ovarian cyst     Patient Active Problem List   Diagnosis Date Noted  . Hyperemesis affecting pregnancy, antepartum 08/08/2017  . Positive urine pregnancy test 07/09/2017  . BMI 33.0-33.9,adult 07/09/2017  . Hx of migraine headaches 07/09/2017  . Endometriosis determined by laparoscopy 01/30/2016  . Pelvic pain 01/17/2016    Past Surgical History:  Procedure Laterality Date  . LAPAROSCOPIC OVARIAN CYSTECTOMY Left 01/22/2016   Procedure: LAPAROSCOPIC LEFT OVARIAN CYSTECTOMY-POSSIBLE BIOPSIES;  Surgeon: Herold Harms, MD;  Location: [facility] ORS;  Service: Gynecology;  Laterality: Left;  excision/fulgeration of endometriosis  . NO PAST SURGERIES      Prior to Admission medications   Medication Sig Start Date End Date Taking? Authorizing Provider  Butalbital-APAP-Caffeine 50-325-40 MG capsule Take 1-2 capsules by mouth every 6 (six) hours as needed for headache. Patient not taking: Reported on 10/16/2017 10/14/17   Hildred Laser, MD  ondansetron (ZOFRAN ODT) 4 MG  disintegrating tablet Take 2 tablets (8 mg total) by mouth every 8 (eight) hours as needed for nausea or vomiting. 10/01/17   Defrancesco, Prentice Docker, MD  Prenatal Vit-Fe Fumarate-FA (PRENATAL PO) Take 1 tablet by mouth every evening.    [provider]  promethazine (PHENERGAN) 25 MG tablet Take 1 tablet (25 mg total) by mouth every 6 (six) hours as needed for nausea or vomiting. 08/28/17   Hildred Laser, MD    Allergies Augmentin [amoxicillin-pot clavulanate]  Family History  Problem Relation Age of Onset  . Heart disease Father   . Cancer Neg Hx   . Ovarian cancer Neg Hx   . Colon cancer Neg Hx   . Diabetes Neg Hx     Social History Social History   Tobacco Use  . Smoking status: Never Smoker  . Smokeless tobacco: Never Used  Substance Use Topics  . Alcohol use: No    Alcohol/week: 0.0 oz  . Drug use: No    Review of Systems Constitutional: No fever/chills Eyes: No visual changes. ENT: No sore throat. Cardiovascular: Denies chest pain. Respiratory: Denies shortness of breath. Gastrointestinal: Positive for contractions Genitourinary: Negative for dysuria. Musculoskeletal: Negative for back pain. Skin: Negative for rash. Neurological: Positive for near syncopal episodes.   ____________________________________________   PHYSICAL EXAM:  VITAL SIGNS: ED Triage Vitals  Enc Vitals Group     BP 11/02/17 1816 (!) 128/52     Pulse Rate 11/02/17 1816 100     Resp 11/02/17 1816 18     Temp 11/02/17 1816 98.7 F (37.1 C)     Temp Source 11/02/17 1816 Oral  SpO2 11/02/17 1816 99 %     Weight 11/02/17 1815 195 lb (88.5 kg)     Height 11/02/17 1815 5\' 4"  (1.626 m)     Head Circumference --      Peak Flow --      Pain Score 11/02/17 1815 6   Constitutional: Alert and oriented.  Eyes: Conjunctivae are normal.  ENT      Head: Normocephalic and atraumatic.      Nose: No congestion/rhinnorhea.      Mouth/Throat: Mucous membranes are moist.      Neck: No  stridor. Hematological/Lymphatic/Immunilogical: No cervical lymphadenopathy. Cardiovascular: Tachycardic, regular rhythm.  No murmurs, rubs, or gallops.  Respiratory: Normal respiratory effort without tachypnea nor retractions. Breath sounds are clear and equal bilaterally. No wheezes/rales/rhonchi. Gastrointestinal: Soft and non tender. No rebound. No guarding.  Genitourinary: Deferred Musculoskeletal: Normal range of motion in all extremities. No lower extremity edema. Neurologic:  Normal speech and language. No gross focal neurologic deficits are appreciated.  Skin:  Skin is warm, dry and intact. No rash noted. Psychiatric: Mood and affect are normal. Speech and behavior are normal. Patient exhibits appropriate insight and judgment.  ____________________________________________    LABS (pertinent positives/negatives)  CBC wbc 19.0, hgb 13.9, plt 210 BMP na 136, k 4.5, cr 0.35 Trop <0.03  ____________________________________________   EKG  I, Phineas SemenGraydon Mahogony Gilchrest, attending physician, personally viewed and interpreted this EKG  EKG Time: 1820 Rate: 105 Rhythm: sinus tachycardia Axis: right axis deviation Intervals: qtc 451 QRS: narrow, q waves v1 ST changes: no st elevation Impression: abnormal ekg  ____________________________________________    RADIOLOGY  CXR Normal heart size  ____________________________________________   PROCEDURES  Procedures  ____________________________________________   INITIAL IMPRESSION / ASSESSMENT AND PLAN / ED COURSE  Pertinent labs & imaging results that were available during my care of the patient were reviewed by me and considered in my medical decision making (see chart for details).   Patient presented to the emergency department today with primary concerns for contractions.  Terms of the contractions patient had good fetal heart tones here however will send to labor and delivery.  Patient also near syncopal episode.  This has  been occurring for the patient during this pregnancy.  She has been seen by cardiology.  Do not feel any further emergency department work-up is necessary.  She does have a leukocytosis of unclear etiology.  Patient not complaining of any abnormal vaginal discharge or bleeding.  No fevers.   ____________________________________________   FINAL CLINICAL IMPRESSION(S) / ED DIAGNOSES  Final diagnoses:  Near syncope     Note: This dictation was prepared with Dragon dictation. Any transcriptional errors that result from this process are unintentional     Phineas SemenGoodman, Keone Kamer, MD 11/02/17 [phone]

## 2017-11-02 NOTE — OB Triage Note (Signed)
Patient presented to L&D with complaints of contractions that started about 2 hours ago.  Denies leaking of fluid or vaginal bleeding. States she hasnt been feeling baby move a lot today.

## 2017-11-02 NOTE — ED Notes (Signed)
Spoke to L&D, stated to send pt to obs 3.

## 2017-11-02 NOTE — Final Progress Note (Signed)
L&D OB Triage Note  HPI:  Carlena Saxaylor M Stephanie Hampton is a 22 y.o. G2P1001 female at 4144w1d. Estimated Date of Delivery: 03/14/18 who presents for complaints of abdominal pain and possibly contractions beginning ~ 2 hours ago.  She also notes she has not been feeling much fetal movement today.  She notes that she still has intermittent nausea/vomiting that she has had throughout the pregnancy, no difference in symptoms.  She reports that she has had a bowel movement today, however she did have to strain and the stool was hard. She is also concerned regarding the vaginal swelling that has been occurring.    OB History  Gravida Para Term Preterm AB Living  2 1 1     1   SAB TAB Ectopic Multiple Live Births          1    # Outcome Date GA Lbr Len/2nd Weight Sex Delivery Anes PTL Lv  2 Current           1 Term 2014 6912w0d  7 lb 4.8 oz (3.311 kg) F Vag-Spont   LIV    Patient Active Problem List   Diagnosis Date Noted  . Hyperemesis affecting pregnancy, antepartum 08/08/2017  . Positive urine pregnancy test 07/09/2017  . BMI 33.0-33.9,adult 07/09/2017  . Hx of migraine headaches 07/09/2017  . Endometriosis determined by laparoscopy 01/30/2016  . Pelvic pain 01/17/2016    Past Medical History:  Diagnosis Date  . Depression   . Endometriosis   . Ovarian cyst     No current facility-administered medications on file prior to encounter.    Current Outpatient Medications on File Prior to Encounter  Medication Sig Dispense Refill  . Butalbital-APAP-Caffeine 50-325-40 MG capsule Take 1-2 capsules by mouth every 6 (six) hours as needed for headache. (Patient not taking: Reported on 10/16/2017) 30 capsule 3  . ondansetron (ZOFRAN ODT) 4 MG disintegrating tablet Take 2 tablets (8 mg total) by mouth every 8 (eight) hours as needed for nausea or vomiting. 30 tablet 1  . Prenatal Vit-Fe Fumarate-FA (PRENATAL PO) Take 1 tablet by mouth every evening.    . promethazine (PHENERGAN) 25 MG tablet Take 1 tablet (25 mg  total) by mouth every 6 (six) hours as needed for nausea or vomiting. 30 tablet 2    Allergies  Allergen Reactions  . Augmentin [Amoxicillin-Pot Clavulanate] Hives    Has patient had a PCN reaction causing immediate rash, facial/tongue/throat swelling, SOB or lightheadedness with hypotension: No Has patient had a PCN reaction causing severe rash involving mucus membranes or skin necrosis: No Has patient had a PCN reaction that required hospitalization: No Has patient had a PCN reaction occurring within the last 10 years: Yes If all of the above answers are "NO", then may proceed with Cephalosporin use.       ROS:  Review of Systems - Negative except for what is noted in the HPI.    Physical Exam:  Blood pressure (!) 114/55, pulse 95, temperature 97.8 F (36.6 C), temperature source Oral, resp. rate 19, height 5\' 4"  (1.626 m), weight 195 lb (88.5 kg), last menstrual period 06/07/2017, SpO2 99 %. General appearance: alert and no distress Abdomen: gravid, mildly tender in upper abdomen (left>right), no organomegaly or masses palpable Pelvic: external genitalia normal. Vagina with scant thin white discharge, no odor. Cervix appears closed, no lesions. Cervical exam noting cervix FT/thick/long. Large amount of f irm stool palpable in posterior vagina (rectal vault) Extremities: extremities normal, atraumatic, no cyanosis or edema  NST INTERPRETATION: Indications: decreased fetal movement, patient reassurance and rule out uterine contractions  Mode: External Baseline Rate (A): (fht 150) Variability: Moderate Accelerations: None Decelerations: None     Contraction Frequency (min): none  Impression: reactive   Labs:  Results for orders placed or performed during the hospital encounter of 11/02/17  CBC with Differential  Result Value Ref Range   WBC 19.0 (H) 3.6 - 11.0 K/uL   RBC 4.48 3.80 - 5.20 MIL/uL   Hemoglobin 13.9 12.0 - 16.0 g/dL   HCT 16.1 09.6 - 04.5 %   MCV 89.8  80.0 - 100.0 fL   MCH 31.0 26.0 - 34.0 pg   MCHC 34.5 32.0 - 36.0 g/dL   RDW 40.9 (H) 81.1 - 91.4 %   Platelets 210 150 - 440 K/uL   Neutrophils Relative % 84 %   Neutro Abs 15.9 (H) 1.4 - 6.5 K/uL   Lymphocytes Relative 10 %   Lymphs Abs 1.9 1.0 - 3.6 K/uL   Monocytes Relative 5 %   Monocytes Absolute 1.0 (H) 0.2 - 0.9 K/uL   Eosinophils Relative 1 %   Eosinophils Absolute 0.2 0 - 0.7 K/uL   Basophils Relative 0 %   Basophils Absolute 0.0 0 - 0.1 K/uL  Basic metabolic panel  Result Value Ref Range   Sodium 136 135 - 145 mmol/L   Potassium 4.5 3.5 - 5.1 mmol/L   Chloride 106 98 - 111 mmol/L   CO2 20 (L) 22 - 32 mmol/L   Glucose, Bld 93 70 - 99 mg/dL   BUN 8 6 - 20 mg/dL   Creatinine, Ser 7.82 (L) 0.44 - 1.00 mg/dL   Calcium 9.2 8.9 - 95.6 mg/dL   GFR calc non Af Amer >60 >60 mL/min   GFR calc Af Amer >60 >60 mL/min   Anion gap 10 5 - 15  Troponin I  Result Value Ref Range   Troponin I <0.03 <0.03 ng/mL  Urinalysis, Complete w Microscopic  Result Value Ref Range   Color, Urine YELLOW (A) YELLOW   APPearance CLEAR (A) CLEAR   Specific Gravity, Urine 1.017 1.005 - 1.030   pH 5.0 5.0 - 8.0   Glucose, UA NEGATIVE NEGATIVE mg/dL   Hgb urine dipstick NEGATIVE NEGATIVE   Bilirubin Urine NEGATIVE NEGATIVE   Ketones, ur 5 (A) NEGATIVE mg/dL   Protein, ur NEGATIVE NEGATIVE mg/dL   Nitrite NEGATIVE NEGATIVE   Leukocytes, UA NEGATIVE NEGATIVE   RBC / HPF 0-5 0 - 5 RBC/hpf   WBC, UA 0-5 0 - 5 WBC/hpf   Bacteria, UA RARE (A) NONE SEEN   Squamous Epithelial / LPF 0-5 0 - 5   Mucus PRESENT     Assessment:  22 y.o. G2P1001 at [redacted]w[redacted]d with:  1.  Abdominal pain 2. Constipation in pregnancy 3. Decreased fetal movement 4. Ketonuria   Plan:  1. Abdominal pain likely secondary constipation.  May also be experiencing some Deberah Pelton. No signs of preterm labor.  With treatment for constipation, symptoms improved significantly.  2. Constipation in pregnancy. Treated with Fleet's  enema. Patient was able to have a regular bowel movement. Will start on Colace for maintenance of regular bowel movements in pregnancy.   3. Decreased fetal movement. Improved while in triage. Discussed with patient that at current gestational age she may or may not feel persistent daily fetal movements. Given reassurance. NST wnl.  4. Ketonuria, mild.  Patient has an IV from initial presentation to the ER. WIll treat with  IV hydration, given iVF bolus of normal saline.    Hildred Laser, MD [facility]

## 2017-11-05 ENCOUNTER — Encounter: Payer: Self-pay | Admitting: Obstetrics and Gynecology

## 2017-11-05 ENCOUNTER — Ambulatory Visit (INDEPENDENT_AMBULATORY_CARE_PROVIDER_SITE_OTHER): Payer: BLUE CROSS/BLUE SHIELD

## 2017-11-05 DIAGNOSIS — Z3A21 21 weeks gestation of pregnancy: Secondary | ICD-10-CM

## 2017-11-05 DIAGNOSIS — Z3482 Encounter for supervision of other normal pregnancy, second trimester: Secondary | ICD-10-CM

## 2017-11-10 ENCOUNTER — Other Ambulatory Visit: Payer: Self-pay | Admitting: Obstetrics and Gynecology

## 2017-11-17 ENCOUNTER — Telehealth: Payer: Self-pay | Admitting: Obstetrics and Gynecology

## 2017-11-17 ENCOUNTER — Telehealth: Payer: Self-pay

## 2017-11-17 NOTE — Telephone Encounter (Signed)
Patient called stating she is supposed to return to work tomorrow but didn't know if she has [facility]. Please Advise.

## 2017-11-17 NOTE — Telephone Encounter (Signed)
Advised pt that Holter monitor results [facility] next [facility]. Pt is not released to work until results can be examined.

## 2017-11-20 ENCOUNTER — Telehealth: Payer: Self-pay | Admitting: Obstetrics and Gynecology

## 2017-11-20 NOTE — Telephone Encounter (Signed)
The patient called and stated that she would like to speak with her nurse Amber in regards to her needing [facility]. Please advise.

## 2017-11-20 NOTE — Telephone Encounter (Signed)
Informed pt that holter monitor results had been received and cardiologist had no special instructions or restrictions for her.

## 2017-11-26 ENCOUNTER — Encounter: Payer: Self-pay | Admitting: Obstetrics and Gynecology

## 2017-11-26 ENCOUNTER — Encounter: Payer: BLUE CROSS/BLUE SHIELD | Admitting: Obstetrics and Gynecology

## 2017-11-26 ENCOUNTER — Ambulatory Visit (INDEPENDENT_AMBULATORY_CARE_PROVIDER_SITE_OTHER): Payer: BLUE CROSS/BLUE SHIELD | Admitting: Obstetrics and Gynecology

## 2017-11-26 VITALS — BP 107/72 | HR 103 | Wt 198.0 lb

## 2017-11-26 DIAGNOSIS — Z3482 Encounter for supervision of other normal pregnancy, second trimester: Secondary | ICD-10-CM

## 2017-11-26 LAB — POCT URINALYSIS DIPSTICK OB
Bilirubin, UA: NEGATIVE
Blood, UA: NEGATIVE
Glucose, UA: NEGATIVE
Ketones, UA: NEGATIVE
LEUKOCYTES UA: NEGATIVE
NITRITE UA: NEGATIVE
PROTEIN: NEGATIVE
Spec Grav, UA: <=1.005 — AB (ref 1.010–1.025)
Urobilinogen, UA: 0.2 E.U./dL
pH, UA: 7 (ref 5.0–8.0)

## 2017-11-26 NOTE — Progress Notes (Signed)
Pt presents today for prenatal care. Pt states she is still having the dizzy spells but feels like they are getting better.

## 2017-11-26 NOTE — Progress Notes (Signed)
ROB: Patient says her lightheadedness is getting much better.  She has no complaints today.  She has returned to work.  1 hour GCT next visit.

## 2017-12-05 ENCOUNTER — Ambulatory Visit (INDEPENDENT_AMBULATORY_CARE_PROVIDER_SITE_OTHER): Payer: BLUE CROSS/BLUE SHIELD | Admitting: Certified Nurse Midwife

## 2017-12-05 ENCOUNTER — Encounter: Payer: BLUE CROSS/BLUE SHIELD | Admitting: Obstetrics and Gynecology

## 2017-12-05 VITALS — BP 141/60 | HR 86 | Wt 197.5 lb

## 2017-12-05 DIAGNOSIS — R109 Unspecified abdominal pain: Secondary | ICD-10-CM

## 2017-12-05 DIAGNOSIS — Z3A25 25 weeks gestation of pregnancy: Secondary | ICD-10-CM

## 2017-12-05 DIAGNOSIS — R110 Nausea: Secondary | ICD-10-CM

## 2017-12-05 DIAGNOSIS — O9989 Other specified diseases and conditions complicating pregnancy, childbirth and the puerperium: Secondary | ICD-10-CM

## 2017-12-05 DIAGNOSIS — Z3482 Encounter for supervision of other normal pregnancy, second trimester: Secondary | ICD-10-CM | POA: Diagnosis not present

## 2017-12-05 LAB — POCT URINALYSIS DIPSTICK OB
BILIRUBIN UA: NEGATIVE
Blood, UA: NEGATIVE
Glucose, UA: NEGATIVE
Ketones, UA: NEGATIVE
LEUKOCYTES UA: NEGATIVE
Nitrite, UA: NEGATIVE
PH UA: 6.5 (ref 5.0–8.0)
POC,PROTEIN,UA: NEGATIVE
Spec Grav, UA: 1.01 (ref 1.010–1.025)
UROBILINOGEN UA: 0.2 U/dL

## 2017-12-05 NOTE — Progress Notes (Signed)
OB WORK IN- pt c/o lower abd pain x 4 days having some nausea

## 2017-12-05 NOTE — Patient Instructions (Signed)
Back Pain in Pregnancy Back pain during pregnancy is common. Back pain may be caused by several factors that are related to changes during your pregnancy. Follow these instructions at home: Managing pain, stiffness, and swelling  If directed, apply ice for sudden (acute) back pain. ? Put ice in a plastic bag. ? Place a towel between your skin and the bag. ? Leave the ice on for 20 minutes, 2-3 times per day.  If directed, apply heat to the affected area before you exercise: ? Place a towel between your skin and the heat pack or heating pad. ? Leave the heat on for 20-30 minutes. ? Remove the heat if your skin turns bright red. This is especially important if you are unable to feel pain, heat, or cold. You may have a greater risk of getting burned. Activity  Exercise as told by your health care provider. Exercising is the best way to prevent or manage back pain.  Listen to your body when lifting. If lifting hurts, ask for help or bend your knees. This uses your leg muscles instead of your back muscles.  Squat down when picking up something from the floor. Do not bend over.  Only use bed rest as told by your health care provider. Bed rest should only be used for the most severe episodes of back pain. Standing, Sitting, and Lying Down  Do not stand in one place for long periods of time.  Use good posture when sitting. Make sure your head rests over your shoulders and is not hanging forward. Use a pillow on your lower back if necessary.  Try sleeping on your side, preferably the left side, with a pillow or two between your legs. If you are sore after a night's rest, your bed may be too soft. A firm mattress may provide more support for your back during pregnancy. General instructions  Do not wear high heels.  Eat a healthy diet. Try to gain weight within your health care provider's recommendations.  Use a maternity girdle, elastic sling, or back brace as told by your health care  provider.  Take over-the-counter and prescription medicines only as told by your health care provider.  Keep all follow-up visits as told by your health care provider. This is important. This includes any visits with any specialists, such as a physical therapist. Contact a health care provider if:  Your back pain interferes with your daily activities.  You have increasing pain in other parts of your body. Get help right away if:  You develop numbness, tingling, weakness, or problems with the use of your arms or legs.  You develop severe back pain that is not controlled with medicine.  You have a sudden change in bowel or bladder control.  You develop shortness of breath, dizziness, or you faint.  You develop nausea, vomiting, or sweating.  You have back pain that is a rhythmic, cramping pain similar to labor pains. Labor pain is usually 1-2 minutes apart, lasts for about 1 minute, and involves a bearing down feeling or pressure in your pelvis.  You have back pain and your water breaks or you have vaginal bleeding.  You have back pain or numbness that travels down your leg.  Your back pain developed after you fell.  You develop pain on one side of your back.  You see blood in your urine.  You develop skin blisters in the area of your back pain. This information is not intended to replace advice given to you   by your health care provider. Make sure you discuss any questions you have with your health care provider. Document Released: 06/26/2005 Document Revised: 08/24/2015 Document Reviewed: 11/30/2014 Elsevier Interactive Patient Education  2018 [employer] Round Ligament Pain The round ligament is a cord of muscle and tissue that helps to support the uterus. It can become a source of pain during pregnancy if it becomes stretched or twisted as the baby grows. The pain usually begins in the second trimester of pregnancy, and it can come and go until the baby is delivered. It is  not a serious problem, and it does not cause harm to the baby. Round ligament pain is usually a short, sharp, and pinching pain, but it can also be a dull, lingering, and aching pain. The pain is felt in the lower side of the abdomen or in the groin. It usually starts deep in the groin and moves up to the outside of the hip area. Pain can occur with:  A sudden change in position.  Rolling over in bed.  Coughing or sneezing.  Physical activity.  Follow these instructions at home: Watch your condition for any changes. Take these steps to help with your pain:  When the pain starts, relax. Then try: ? Sitting down. ? Flexing your knees up to your abdomen. ? Lying on your side with one pillow under your abdomen and another pillow between your legs. ? Sitting in a warm bath for 15-20 minutes or until the pain goes away.  Take over-the-counter and prescription medicines only as told by your health care provider.  Move slowly when you sit and stand.  Avoid long walks if they cause pain.  Stop or lessen your physical activities if they cause pain.  Contact a health care provider if:  Your pain does not go away with treatment.  You feel pain in your back that you did not have before.  Your medicine is not helping. Get help right away if:  You develop a fever or chills.  You develop uterine contractions.  You develop vaginal bleeding.  You develop nausea or vomiting.  You develop diarrhea.  You have pain when you urinate. This information is not intended to replace advice given to you by your health care provider. Make sure you discuss any questions you have with your health care provider. Document Released: 12/26/2007 Document Revised: 08/24/2015 Document Reviewed: 05/25/2014 Elsevier Interactive Patient Education  2018 Elsevier Inc.  

## 2017-12-07 LAB — URINE CULTURE

## 2017-12-09 ENCOUNTER — Encounter: Payer: Self-pay | Admitting: Certified Nurse Midwife

## 2017-12-09 ENCOUNTER — Other Ambulatory Visit: Payer: Self-pay | Admitting: Certified Nurse Midwife

## 2017-12-09 DIAGNOSIS — Z3482 Encounter for supervision of other normal pregnancy, second trimester: Secondary | ICD-10-CM | POA: Diagnosis not present

## 2017-12-09 DIAGNOSIS — Z3483 Encounter for supervision of other normal pregnancy, third trimester: Secondary | ICD-10-CM | POA: Diagnosis not present

## 2017-12-09 MED ORDER — CYCLOBENZAPRINE HCL 5 MG PO TABS: 10.0000 mg | ORAL_TABLET | Freq: Three times a day (TID) | ORAL | 0 refills | Status: DC | PRN

## 2017-12-10 DIAGNOSIS — Z8249 Family history of ischemic heart disease and other diseases of the circulatory system: Secondary | ICD-10-CM | POA: Diagnosis not present

## 2017-12-12 DIAGNOSIS — Z8249 Family history of ischemic heart disease and other diseases of the circulatory system: Secondary | ICD-10-CM | POA: Diagnosis not present

## 2017-12-15 DIAGNOSIS — Z3483 Encounter for supervision of other normal pregnancy, third trimester: Secondary | ICD-10-CM | POA: Diagnosis not present

## 2017-12-15 DIAGNOSIS — Z3482 Encounter for supervision of other normal pregnancy, second trimester: Secondary | ICD-10-CM | POA: Diagnosis not present

## 2017-12-15 NOTE — Progress Notes (Addendum)
Subjective:   Stephanie Hampton is a 22 y.o. G2P1001 6760w6d being seen today for work in probelm obstetrical visit.  Patient reports reports lower abdominal pain and nausea for the last four (4) days.  Denies contractions, vaginal bleeding or leaking of fluid.  Reports good fetal movement.  Denies difficulty breathing or respiratory distress, chest pain, dysuria, and leg pain or swelling.   The following portions of the patient's history were reviewed and updated as appropriate: allergies, current medications, past family history, past medical history, past social history, past surgical history and problem list.   Objective:   BP (!) 141/60   Pulse 86   Wt 197 lb 8 oz (89.6 kg)   LMP 06/07/2017 (Exact Date)   BMI 33.90 kg/m   FHT: Fetal Heart Rate (bpm): 140  Fetal Movement: Movement: Present    Abdomen:  soft, gravid, appropriate for gestational age,non-tender   Urinalysis    Component Value Date/Time   GLUCOSEU Negative 12/05/2017 1135   BILIRUBINUR neg 12/05/2017 1135   UROBILINOGEN 0.2 12/05/2017 1135   NITRITE neg 12/05/2017 1135   LEUKOCYTESUR Negative 12/05/2017 1135     Assessment:   Pregnancy:  G2P1001 at 5160w6d  1. Encounter for supervision of other normal pregnancy in second trimester  - Urine Culture - POC Urinalysis Dipstick OB  Plan:   NST performed today was reviewed and was found to be reactive. Baseline 140 bpm with Moderate variability, accelerations present and no decelerations noted.  Continue recommended antenatal testing and prenatal care.  Discussed home treatment measures including the use of abdominal support and OTC medications.   Labs: see orders.   Preterm labor symptoms: vaginal bleeding, contractions and leaking of fluid reviewed in detail.  Fetal movement precautions reviewed.  Follow up as previously schduled. Per Dr. Valentino Saxonherry, may transfer to midwifery care.    Gunnar BullaJenkins Michelle Jrue Yambao, CNM [facility], [facility]

## 2017-12-17 ENCOUNTER — Ambulatory Visit: Payer: BLUE CROSS/BLUE SHIELD | Admitting: Cardiovascular Disease

## 2017-12-21 NOTE — Progress Notes (Deleted)
Cardiology Office Note  Date:  12/21/2017   ID:  Stephanie Hampton, DOB [birth year], MRN [id number]  PCP:  Linzie CollinEvans, David James, MD   No chief complaint on file.   HPI:  Stephanie Hampton is a 22 year old woman with past medical history of Lightheadedness/near syncope Presents by referral from Dr. Brennan Baileyavid Evans for consultation of her near syncope   Holter monitor   second trimester a multiple episodes of syncope.  This started occurring every few weeks but now occurs multiple times per day.  She describes that she develops a tachycardia, tinnitus, visual disturbance and a warm feeling. It typically occurs when she is up moving around and is better if she lays down.   She is had occasional episodes prior to her pregnancy.   A couple of these episodes of occurred when she was sitting. She has no other associated symptoms. She denies thirst. She has had mild edema. Her pregnancy is otherwise normal. She is due in December.   [facility]  Echocardiogram normal both October 27, 2017 and prior study in 2017 and 2010 family history of a cardia myopathy. She has an uncle who has hemochromatosis. Her father had a restrictive cardia myopathy and substance developed systolic dysfunction. He is undergone transplantation. Genetic testing demonstrated specific mutations.    PMH:   has a past medical history of Depression, Endometriosis, and Ovarian cyst.  PSH:    Past Surgical History:  Procedure Laterality Date  . LAPAROSCOPIC OVARIAN CYSTECTOMY Left 01/22/2016   Procedure: LAPAROSCOPIC LEFT OVARIAN CYSTECTOMY-POSSIBLE BIOPSIES;  Surgeon: Herold HarmsMartin A Defrancesco, MD;  Location: [facility] ORS;  Service: Gynecology;  Laterality: Left;  excision/fulgeration of endometriosis  . NO PAST SURGERIES      Current Outpatient Medications  Medication Sig Dispense Refill  . Butalbital-APAP-Caffeine 50-325-40 MG capsule Take 1-2 capsules by mouth every 6 (six) hours as needed for headache.  (Patient not taking: Reported on 10/16/2017) 30 capsule 3  . cyclobenzaprine (FLEXERIL) 5 MG tablet Take 2 tablets (10 mg total) by mouth 3 (three) times daily as needed (back pain). May take 5-10 mg PO TID PRN 30 tablet 0  . docusate sodium (COLACE) 100 MG capsule Take 1 capsule (100 mg total) by mouth daily. 30 capsule 2  . ondansetron (ZOFRAN-ODT) 4 MG disintegrating tablet TAKE 2 TABLETS (8 MG TOTAL) BY MOUTH EVERY 8 (EIGHT) HOURS AS NEEDED FOR NAUSEA OR VOMITING. 30 tablet 1  . Prenatal Vit-Fe Fumarate-FA (PRENATAL PO) Take 1 tablet by mouth every evening.    . promethazine (PHENERGAN) 25 MG tablet Take 1 tablet (25 mg total) by mouth every 6 (six) hours as needed for nausea or vomiting. 30 tablet 2   No current facility-administered medications for this visit.      Allergies:   Augmentin [amoxicillin-pot clavulanate]   Social History:  The patient  reports that she has never smoked. She has never used smokeless tobacco. She reports that she does not drink alcohol or use drugs.   Family History:   family history includes Heart disease in her father.    Review of Systems: ROS   PHYSICAL EXAM: VS:  LMP 06/07/2017 (Exact Date)  , BMI There is no height or weight on file to calculate BMI. GEN: Well nourished, well developed, in no acute distress HEENT: normal Neck: no JVD, carotid bruits, or masses Cardiac: RRR; no murmurs, rubs, or gallops,no edema  Respiratory:  clear to auscultation bilaterally, normal work of breathing GI: soft, nontender, nondistended, +  BS MS: no deformity or atrophy Skin: warm and dry, no rash Neuro:  Strength and sensation are intact Psych: euthymic mood, full affect    Recent Labs: 10/14/2017: ALT 20; TSH 0.947 11/02/2017: BUN 8; Creatinine, Ser 0.35; Hemoglobin 13.9; Platelets 210; Potassium 4.5; Sodium 136    Lipid Panel No results found for: CHOL, HDL, LDLCALC, TRIG    Wt Readings from Last 3 Encounters:  12/05/17 197 lb 8 oz (89.6 kg)   11/26/17 198 lb (89.8 kg)  11/02/17 195 lb (88.5 kg)       ASSESSMENT AND PLAN:  No diagnosis found.   Disposition:   F/U  6 months  No orders of the defined types were placed in this encounter.    Signed, Dossie Arbour, M.D., Ph.D. 12/21/2017  [facility] [place], [place] [phone] \

## 2017-12-23 ENCOUNTER — Ambulatory Visit: Payer: BLUE CROSS/BLUE SHIELD | Admitting: Cardiovascular Disease

## 2017-12-24 ENCOUNTER — Other Ambulatory Visit: Payer: BLUE CROSS/BLUE SHIELD

## 2017-12-24 ENCOUNTER — Ambulatory Visit (INDEPENDENT_AMBULATORY_CARE_PROVIDER_SITE_OTHER): Payer: BLUE CROSS/BLUE SHIELD | Admitting: Obstetrics and Gynecology

## 2017-12-24 ENCOUNTER — Encounter: Payer: Self-pay | Admitting: Cardiovascular Disease

## 2017-12-24 ENCOUNTER — Encounter: Payer: BLUE CROSS/BLUE SHIELD | Admitting: Obstetrics and Gynecology

## 2017-12-24 VITALS — BP 113/74 | HR 94 | Wt 201.7 lb

## 2017-12-24 DIAGNOSIS — Z3493 Encounter for supervision of normal pregnancy, unspecified, third trimester: Secondary | ICD-10-CM

## 2017-12-24 DIAGNOSIS — Z23 Encounter for immunization: Secondary | ICD-10-CM | POA: Diagnosis not present

## 2017-12-24 DIAGNOSIS — Z131 Encounter for screening for diabetes mellitus: Secondary | ICD-10-CM

## 2017-12-24 DIAGNOSIS — Z130 Encounter for screening for diseases of the blood and blood-forming organs and certain disorders involving the immune mechanism: Secondary | ICD-10-CM

## 2017-12-24 LAB — POCT URINALYSIS DIPSTICK OB
BILIRUBIN UA: NEGATIVE
GLUCOSE, UA: NEGATIVE
Leukocytes, UA: NEGATIVE
Nitrite, UA: NEGATIVE
PH UA: 7 (ref 5.0–8.0)
RBC UA: NEGATIVE
SPEC GRAV UA: 1.01 (ref 1.010–1.025)
Urobilinogen, UA: 0.2 E.U./dL

## 2017-12-24 MED ORDER — TETANUS-DIPHTH-ACELL PERTUSSIS 5-2.5-18.5 LF-MCG/0.5 IM SUSP
0.5000 mL | Freq: Once | INTRAMUSCULAR | Status: AC
  Administered 2017-12-24: 0.5 mL via INTRAMUSCULAR

## 2017-12-24 NOTE — Progress Notes (Signed)
ROB and glucola-doing well, denies any changes, Tdap given, desires to wait for flu vaccine till next visit.

## 2017-12-24 NOTE — Progress Notes (Signed)
ROB- glucola done today,blood consent signed, tdap given, pt is doing well 

## 2017-12-24 NOTE — Patient Instructions (Signed)

## 2017-12-25 ENCOUNTER — Observation Stay
Admission: EM | Admit: 2017-12-25 | Discharge: 2017-12-25 | Disposition: A | Discharge status: Home / Self Care | Payer: BLUE CROSS/BLUE SHIELD | Source: Ambulatory Visit | Attending: Certified Nurse Midwife | Admitting: Certified Nurse Midwife

## 2017-12-25 ENCOUNTER — Other Ambulatory Visit: Payer: Self-pay

## 2017-12-25 DIAGNOSIS — Z79899 Other long term (current) drug therapy: Secondary | ICD-10-CM | POA: Insufficient documentation

## 2017-12-25 DIAGNOSIS — R030 Elevated blood-pressure reading, without diagnosis of hypertension: Secondary | ICD-10-CM | POA: Diagnosis not present

## 2017-12-25 DIAGNOSIS — Z1589 Genetic susceptibility to other disease: Secondary | ICD-10-CM | POA: Diagnosis not present

## 2017-12-25 DIAGNOSIS — Z3A38 38 weeks gestation of pregnancy: Secondary | ICD-10-CM | POA: Diagnosis not present

## 2017-12-25 DIAGNOSIS — R51 Headache: Secondary | ICD-10-CM | POA: Diagnosis not present

## 2017-12-25 DIAGNOSIS — O9989 Other specified diseases and conditions complicating pregnancy, childbirth and the puerperium: Secondary | ICD-10-CM | POA: Diagnosis not present

## 2017-12-25 LAB — PROTEIN / CREATININE RATIO, URINE
Creatinine, Urine: 109 mg/dL
PROTEIN CREATININE RATIO: 0.12 mg/mg{creat} (ref 0.00–0.15)
Total Protein, Urine: 13 mg/dL

## 2017-12-25 LAB — CBC
HEMATOCRIT: 37.6 % (ref 34.0–46.6)
HEMATOCRIT: 37 % (ref 35.0–47.0)
Hemoglobin: 12.6 g/dL (ref 11.1–15.9)
Hemoglobin: 13 g/dL (ref 12.0–16.0)
MCH: 29.4 pg (ref 26.6–33.0)
MCH: 31.3 pg (ref 26.0–34.0)
MCHC: 33.5 g/dL (ref 31.5–35.7)
MCHC: 35.2 g/dL (ref 32.0–36.0)
MCV: 88 fL (ref 79–97)
MCV: 88.8 fL (ref 80.0–100.0)
PLATELETS: 206 10*3/uL (ref 150–440)
Platelets: 242 10*3/uL (ref 150–450)
RBC: 4.28 x10E6/uL (ref 3.77–5.28)
RBC: 4.16 MIL/uL (ref 3.80–5.20)
RDW: 12.7 % (ref 12.3–15.4)
RDW: 13.6 % (ref 11.5–14.5)
WBC: 14.9 10*3/uL — ABNORMAL HIGH (ref 3.4–10.8)
WBC: 16.1 10*3/uL — ABNORMAL HIGH (ref 3.6–11.0)

## 2017-12-25 LAB — URINALYSIS, COMPLETE (UACMP) WITH MICROSCOPIC
Bilirubin Urine: NEGATIVE
GLUCOSE, UA: NEGATIVE mg/dL
Hgb urine dipstick: NEGATIVE
KETONES UR: NEGATIVE mg/dL
Leukocytes, UA: NEGATIVE
Nitrite: NEGATIVE
PH: 7 (ref 5.0–8.0)
PROTEIN: NEGATIVE mg/dL
Specific Gravity, Urine: 1.017 (ref 1.005–1.030)

## 2017-12-25 LAB — COMPREHENSIVE METABOLIC PANEL
ALBUMIN: 3.3 g/dL — AB (ref 3.5–5.0)
ALT: 18 U/L (ref 0–44)
ANION GAP: 10 (ref 5–15)
AST: 17 U/L (ref 15–41)
Alkaline Phosphatase: 106 U/L (ref 38–126)
BILIRUBIN TOTAL: 0.4 mg/dL (ref 0.3–1.2)
BUN: 6 mg/dL (ref 6–20)
CO2: 22 mmol/L (ref 22–32)
Calcium: 9 mg/dL (ref 8.9–10.3)
Chloride: 107 mmol/L (ref 98–111)
Creatinine, Ser: 0.52 mg/dL (ref 0.44–1.00)
GFR calc Af Amer: >60 mL/min (ref >60)
GFR calc non Af Amer: >60 mL/min (ref >60)
GLUCOSE: 97 mg/dL (ref 70–99)
POTASSIUM: 3.7 mmol/L (ref 3.5–5.1)
SODIUM: 139 mmol/L (ref 135–145)
TOTAL PROTEIN: 7 g/dL (ref 6.5–8.1)

## 2017-12-25 LAB — RPR: RPR: NONREACTIVE

## 2017-12-25 LAB — GLUCOSE, 1 HOUR GESTATIONAL: GESTATIONAL DIABETES SCREEN: 84 mg/dL (ref 65–139)

## 2017-12-25 MED ORDER — ACETAMINOPHEN 500 MG PO TABS
1000.0000 mg | ORAL_TABLET | Freq: Once | ORAL | Status: AC | PRN
  Administered 2017-12-25: 1000 mg via ORAL
  Filled 2017-12-25: qty 2

## 2017-12-25 NOTE — OB Triage Note (Signed)
L&D OB Triage Note  SUBJECTIVE Stephanie Hampton is a 22 y.o. G2P1001 female at [redacted]w[redacted]d, EDD Estimated Date of Delivery: 03/14/18 who presented to triage with complaints of headache and elevated blood pressures at home. 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 She states that she was not feeling right this morning and that is why she took her blood pressure. She denies any issue with chest pain or shortness of breath. She denies epigastric pain and visual changes. Feels good fetal movement. Denies contractions.    OB History  Gravida Para Term Preterm AB Living  2 1 1  0 0 1  SAB TAB Ectopic Multiple Live Births  0 0 0 0 1    # Outcome Date GA Lbr Len/2nd Weight Sex Delivery Anes PTL Lv  2 Current           1 Term 2014 [redacted]w[redacted]d  3311 g F Vag-Spont   LIV    Medications Prior to Admission  Medication Sig Dispense Refill Last Dose  . ondansetron (ZOFRAN-ODT) 4 MG disintegrating tablet TAKE 2 TABLETS (8 MG TOTAL) BY MOUTH EVERY 8 (EIGHT) HOURS AS NEEDED FOR NAUSEA OR VOMITING. 30 tablet 1 12/24/2017 at Unknown time  . Prenatal Vit-Fe Fumarate-FA (PRENATAL PO) Take 1 tablet by mouth every evening.   12/24/2017 at Unknown time  . Butalbital-APAP-Caffeine 50-325-40 MG capsule Take 1-2 capsules by mouth every 6 (six) hours as needed for headache. (Patient not taking: Reported on 10/16/2017) 30 capsule 3 Not Taking at Unknown time  . cyclobenzaprine (FLEXERIL) 5 MG tablet Take 2 tablets (10 mg total) by mouth 3 (three) times daily as needed (back pain). May take 5-10 mg PO TID PRN (Patient not taking: Reported on 12/24/2017) 30 tablet 0 Not Taking at Unknown time  . docusate sodium (COLACE) 100 MG capsule Take 1 capsule (100 mg total) by mouth daily. (Patient not taking: Reported on 12/24/2017) 30 capsule 2 Not Taking at Unknown time  . promethazine (PHENERGAN) 25 MG tablet Take 1 tablet (25 mg total) by mouth every 6 (six) hours as needed for nausea  or vomiting. (Patient not taking: Reported on 12/25/2017) 30 tablet 2 Not Taking at Unknown time     OBJECTIVE  Nursing Evaluation:   BP 124/66   Pulse (!) 104   Temp 97.7 F (36.5 C) (Oral)   Resp 18   Ht 5\' 4"  (1.626 m)   Wt 91.2 kg   LMP 06/07/2017 (Exact Date)   BMI 34.50 kg/m    Findings:   Appropriate for gestational age  NST was performed and has been reviewed by me.  NST INTERPRETATION: Category I  Mode: External Baseline Rate (A): 150 bpm Variability: Moderate Accelerations: 15 x 15 Decelerations: None     Contraction Frequency (min): none w. UI   CBC    Component Value Date/Time   WBC 16.1 (H) 12/25/2017 1043   RBC 4.16 12/25/2017 1043   HGB 13.0 12/25/2017 1043   HGB 12.6 12/24/2017 1024   HCT 37.0 12/25/2017 1043   HCT 37.6 12/24/2017 1024   PLT 206 12/25/2017 1043   PLT 242 12/24/2017 1024   MCV 88.8 12/25/2017 1043   MCV 88 12/24/2017 1024   MCV 88 12/19/2013 0500   MCH 31.3 12/25/2017 1043   MCHC 35.2 12/25/2017 1043   RDW 13.6 12/25/2017 1043   RDW 12.7 12/24/2017 1024   RDW 13.1 12/19/2013 0500   LYMPHSABS 1.9 11/02/2017 1821  LYMPHSABS 1.6 08/18/2017 1136   LYMPHSABS 1.8 12/19/2013 0500   MONOABS 1.0 (H) 11/02/2017 1821   MONOABS 0.9 12/19/2013 0500   EOSABS 0.2 11/02/2017 1821   EOSABS 0.0 08/18/2017 1136   EOSABS 0.3 12/19/2013 0500   BASOSABS 0.0 11/02/2017 1821   BASOSABS 0.0 08/18/2017 1136   BASOSABS 0.0 12/19/2013 0500   Protein / creatinine ratio, urine  Order: [id number]  Status:  Final result  Visible to patient:  No (Not Released)  Next appt:  01/07/2018 at 03:45 PM in Obstetrics and Gynecology Doreene Burke, CNM)   Ref Range & Units 10:36  Creatinine, Urine mg/dL 409   Total Protein, Urine mg/dL 13   Comment: NO NORMAL RANGE ESTABLISHED FOR THIS TEST  Protein Creatinine Ratio 0.00 - 0.15 mg/mg 0.12   Comment: Performed at [facility], [address] [place], [place] [address]         ASSESSMENT Impression:  1.  Pregnancy:  G2P1001 at [redacted]w[redacted]d , EDD Estimated Date of Delivery: 03/14/18 2.  NST:  approrpriate for gestational age with 10 x10 accelerations present.   PLAN 1. Reassurance given. Offered Fioricet for headache-she declined. Pt offered to go to the ED to be evaluated , she declined.  2. Discharge home with standard labor precautions and pre E precautions given. She is to return to L&D or call the office for problems. 3. Continue routine prenatal care.  Doreene Burke, CNM

## 2017-12-25 NOTE — OB Triage Note (Addendum)
Pt is a G2 P1 at 28w 3d with c/o high blood pressure and a headache. Pt states she is having mentraul cramping beginning last night 1900. Pt states she has a constant headache. Denies visual changes. Pt has positive fetal movement. Pt denies vaginal bleeding and discharge. Monitors applied and assessing. Initial FHR 150.

## 2017-12-29 DIAGNOSIS — R55 Syncope and collapse: Secondary | ICD-10-CM | POA: Diagnosis not present

## 2017-12-29 DIAGNOSIS — R002 Palpitations: Secondary | ICD-10-CM | POA: Diagnosis not present

## 2017-12-31 ENCOUNTER — Telehealth: Payer: Self-pay

## 2017-12-31 NOTE — Telephone Encounter (Signed)
Spoke with Stephanie Hampton- she states her cardiologist- Fidel Levy MD- placed the referral for MFM on 12/29/17 but Stephanie Hampton has not heard from them yet. Dr Aundria Rud suggested to her she keep her future appts with [facility].

## 2018-01-01 DIAGNOSIS — Z3482 Encounter for supervision of other normal pregnancy, second trimester: Secondary | ICD-10-CM | POA: Diagnosis not present

## 2018-01-01 DIAGNOSIS — Z3483 Encounter for supervision of other normal pregnancy, third trimester: Secondary | ICD-10-CM | POA: Diagnosis not present

## 2018-01-05 ENCOUNTER — Encounter: Payer: Self-pay | Admitting: Emergency Medicine

## 2018-01-05 ENCOUNTER — Emergency Department
Admission: EM | Admit: 2018-01-05 | Discharge: 2018-01-05 | Disposition: A | Discharge status: Home / Self Care | Payer: BLUE CROSS/BLUE SHIELD | Attending: Emergency Medicine | Admitting: Emergency Medicine

## 2018-01-05 ENCOUNTER — Other Ambulatory Visit: Payer: Self-pay

## 2018-01-05 DIAGNOSIS — R079 Chest pain, unspecified: Secondary | ICD-10-CM | POA: Diagnosis not present

## 2018-01-05 DIAGNOSIS — R52 Pain, unspecified: Secondary | ICD-10-CM | POA: Diagnosis not present

## 2018-01-05 DIAGNOSIS — R000 Tachycardia, unspecified: Secondary | ICD-10-CM | POA: Diagnosis not present

## 2018-01-05 DIAGNOSIS — M5489 Other dorsalgia: Secondary | ICD-10-CM | POA: Diagnosis not present

## 2018-01-05 DIAGNOSIS — O26893 Other specified pregnancy related conditions, third trimester: Secondary | ICD-10-CM | POA: Diagnosis not present

## 2018-01-05 DIAGNOSIS — I959 Hypotension, unspecified: Secondary | ICD-10-CM | POA: Diagnosis not present

## 2018-01-05 DIAGNOSIS — O99613 Diseases of the digestive system complicating pregnancy, third trimester: Secondary | ICD-10-CM | POA: Diagnosis not present

## 2018-01-05 DIAGNOSIS — K2900 Acute gastritis without bleeding: Secondary | ICD-10-CM | POA: Insufficient documentation

## 2018-01-05 DIAGNOSIS — R1010 Upper abdominal pain, unspecified: Secondary | ICD-10-CM | POA: Diagnosis not present

## 2018-01-05 DIAGNOSIS — R112 Nausea with vomiting, unspecified: Secondary | ICD-10-CM | POA: Diagnosis not present

## 2018-01-05 LAB — CBC
HCT: 35.8 % (ref 35.0–47.0)
HEMOGLOBIN: 12.2 g/dL (ref 12.0–16.0)
MCH: 30.6 pg (ref 26.0–34.0)
MCHC: 33.9 g/dL (ref 32.0–36.0)
MCV: 90.3 fL (ref 80.0–100.0)
Platelets: 224 10*3/uL (ref 150–440)
RBC: 3.97 MIL/uL (ref 3.80–5.20)
RDW: 13.7 % (ref 11.5–14.5)
WBC: 15.3 10*3/uL — AB (ref 3.6–11.0)

## 2018-01-05 LAB — TROPONIN I

## 2018-01-05 LAB — URINALYSIS, COMPLETE (UACMP) WITH MICROSCOPIC
Bilirubin Urine: NEGATIVE
Glucose, UA: NEGATIVE mg/dL
HGB URINE DIPSTICK: NEGATIVE
KETONES UR: 5 mg/dL — AB
Leukocytes, UA: NEGATIVE
NITRITE: NEGATIVE
PROTEIN: NEGATIVE mg/dL
Specific Gravity, Urine: 1.019 (ref 1.005–1.030)
pH: 6 (ref 5.0–8.0)

## 2018-01-05 LAB — COMPREHENSIVE METABOLIC PANEL
ALT: 19 U/L (ref 0–44)
AST: 25 U/L (ref 15–41)
Albumin: 3.2 g/dL — ABNORMAL LOW (ref 3.5–5.0)
Alkaline Phosphatase: 134 U/L — ABNORMAL HIGH (ref 38–126)
Anion gap: 7 (ref 5–15)
BUN: 8 mg/dL (ref 6–20)
CHLORIDE: 107 mmol/L (ref 98–111)
CO2: 21 mmol/L — AB (ref 22–32)
CREATININE: 0.48 mg/dL (ref 0.44–1.00)
Calcium: 8.7 mg/dL — ABNORMAL LOW (ref 8.9–10.3)
GFR calc Af Amer: >60 mL/min (ref >60)
GFR calc non Af Amer: >60 mL/min (ref >60)
Glucose, Bld: 119 mg/dL — ABNORMAL HIGH (ref 70–99)
Potassium: 3.3 mmol/L — ABNORMAL LOW (ref 3.5–5.1)
Sodium: 135 mmol/L (ref 135–145)
Total Bilirubin: 0.7 mg/dL (ref 0.3–1.2)
Total Protein: 6.6 g/dL (ref 6.5–8.1)

## 2018-01-05 MED ORDER — SUCRALFATE 1 G PO TABS: 1.0000 g | ORAL_TABLET | Freq: Four times a day (QID) | ORAL | 1 refills | Status: DC

## 2018-01-05 MED ORDER — METOCLOPRAMIDE HCL 5 MG/ML IJ SOLN
10.0000 mg | Freq: Once | INTRAMUSCULAR | Status: DC
  Filled 2018-01-05: qty 2

## 2018-01-05 MED ORDER — DIPHENHYDRAMINE HCL 50 MG/ML IJ SOLN
25.0000 mg | Freq: Once | INTRAMUSCULAR | Status: AC
  Administered 2018-01-05: 25 mg via INTRAVENOUS
  Filled 2018-01-05: qty 1

## 2018-01-05 MED ORDER — DEXTROSE-NACL 5-0.45 % IV SOLN
Freq: Once | INTRAVENOUS | Status: AC
  Administered 2018-01-05: 17:00:00 via INTRAVENOUS

## 2018-01-05 MED ORDER — ALUM & MAG HYDROXIDE-SIMETH 200-200-20 MG/5ML PO SUSP
15.0000 mL | Freq: Once | ORAL | Status: AC
  Administered 2018-01-05: 15 mL via ORAL
  Filled 2018-01-05: qty 30

## 2018-01-05 MED ORDER — RANITIDINE HCL 150 MG PO CAPS: 150.0000 mg | ORAL_CAPSULE | Freq: Two times a day (BID) | ORAL | 0 refills | Status: DC

## 2018-01-05 NOTE — ED Triage Notes (Signed)
FHT = 148.

## 2018-01-05 NOTE — ED Provider Notes (Signed)
[facility] Emergency Department Provider Note  ____________________________________________  Time seen: Approximately 6:48 PM  I have reviewed the triage vital signs and the nursing notes.   HISTORY  Chief Complaint Chest Pain    HPI Stephanie Hampton is a 22 y.o. female with a history of endometriosis, migraine, and currently in third trimester pregnancy.  She complains of upper abdominal pain for the past 1 to 2 hours.  She describes it as being 8/10 intensity when it started, now 2/10 intensity.  Not exertional, not pleuritic.  No associated shortness of breath diaphoresis vomiting or radiation.  She notes that she recently had a GI illness and has had decreased oral intake for the past 2 to 3 days.  Denies any complications during this pregnancy.  Usual prenatal care.  No vaginal bleeding leakage of fluid contractions.  Normal fetal movements.      Past Medical History:  Diagnosis Date  . Depression   . Endometriosis   . Ovarian cyst      Patient Active Problem List   Diagnosis Date Noted  . Labor and delivery, indication for care 12/25/2017  . Constipation during pregnancy in second trimester 11/02/2017  . Constipation in pregnancy in second trimester 11/02/2017  . Hyperemesis affecting pregnancy, antepartum 08/08/2017  . Positive urine pregnancy test 07/09/2017  . BMI 33.0-33.9,adult 07/09/2017  . Hx of migraine headaches 07/09/2017  . Endometriosis determined by laparoscopy 01/30/2016  . Pelvic pain 01/17/2016     Past Surgical History:  Procedure Laterality Date  . LAPAROSCOPIC OVARIAN CYSTECTOMY Left 01/22/2016   Procedure: LAPAROSCOPIC LEFT OVARIAN CYSTECTOMY-POSSIBLE BIOPSIES;  Surgeon: Herold Harms, MD;  Location: [facility] ORS;  Service: Gynecology;  Laterality: Left;  excision/fulgeration of endometriosis  . NO PAST SURGERIES       Prior to Admission medications   Medication Sig Start Date End Date Taking? Authorizing  Provider  Butalbital-APAP-Caffeine 50-325-40 MG capsule Take 1-2 capsules by mouth every 6 (six) hours as needed for headache. Patient not taking: Reported on 10/16/2017 10/14/17   Hildred Laser, MD  cyclobenzaprine (FLEXERIL) 5 MG tablet Take 2 tablets (10 mg total) by mouth 3 (three) times daily as needed (back pain). May take 5-10 mg PO TID PRN Patient not taking: Reported on 12/24/2017 12/09/17   Gunnar Bulla, CNM  docusate sodium (COLACE) 100 MG capsule Take 1 capsule (100 mg total) by mouth daily. Patient not taking: Reported on 12/24/2017 11/02/17   Hildred Laser, MD  ondansetron (ZOFRAN-ODT) 4 MG disintegrating tablet TAKE 2 TABLETS (8 MG TOTAL) BY MOUTH EVERY 8 (EIGHT) HOURS AS NEEDED FOR NAUSEA OR VOMITING. 11/10/17   Linzie Collin, MD  Prenatal Vit-Fe Fumarate-FA (PRENATAL PO) Take 1 tablet by mouth every evening.    [provider]  promethazine (PHENERGAN) 25 MG tablet Take 1 tablet (25 mg total) by mouth every 6 (six) hours as needed for nausea or vomiting. Patient not taking: Reported on 12/25/2017 08/28/17   Hildred Laser, MD  ranitidine (ZANTAC) 150 MG capsule Take 1 capsule (150 mg total) by mouth 2 (two) times daily. 01/05/18   Sharman Cheek, MD  sucralfate (CARAFATE) 1 g tablet Take 1 tablet (1 g total) by mouth 4 (four) times daily. 01/05/18   Sharman Cheek, MD     Allergies Augmentin [amoxicillin-pot clavulanate]   Family History  Problem Relation Age of Onset  . Heart disease Father   . Cancer Neg Hx   . Ovarian cancer Neg Hx   . Colon cancer  Neg Hx   . Diabetes Neg Hx     Social History Social History   Tobacco Use  . Smoking status: Never Smoker  . Smokeless tobacco: Never Used  Substance Use Topics  . Alcohol use: No    Alcohol/week: 0.0 standard drinks  . Drug use: No    Review of Systems  Constitutional:   No fever or chills.  ENT:   No sore throat. No rhinorrhea. Cardiovascular:   No chest pain or syncope. Respiratory:    No dyspnea or cough. Gastrointestinal:   Positive upper abd pain without vomiting and diarrhea. No constipation currently Musculoskeletal:   Negative for focal pain or swelling All other systems reviewed and are negative except as documented above in ROS and HPI.  ____________________________________________   PHYSICAL EXAM:  VITAL SIGNS: ED Triage Vitals  Enc Vitals Group     BP 01/05/18 1237 (!) 101/44     Pulse Rate 01/05/18 1237 (!) 102     Resp 01/05/18 1237 20     Temp 01/05/18 1237 97.8 F (36.6 C)     Temp Source 01/05/18 1237 Oral     SpO2 01/05/18 1237 97 %     Weight 01/05/18 1238 199 lb (90.3 kg)     Height 01/05/18 1238 5\' 4"  (1.626 m)     Head Circumference --      Peak Flow --      Pain Score 01/05/18 1237 4     Pain Loc --      Pain Edu? --      Excl. in GC? --     Vital signs reviewed, nursing assessments reviewed.   Constitutional:   Alert and oriented. Non-toxic appearance. Eyes:   Conjunctivae are normal. EOMI. PERRL. ENT      Head:   Normocephalic and atraumatic.      Nose:   No congestion/rhinnorhea.       Mouth/Throat:   MMM, no pharyngeal erythema. No peritonsillar mass.       Neck:   No meningismus. Full ROM. Hematological/Lymphatic/Immunilogical:   No cervical lymphadenopathy. Cardiovascular:   RRR. Symmetric bilateral radial and DP pulses.  No murmurs. Cap refill less than 2 seconds. Respiratory:   Normal respiratory effort without tachypnea/retractions. Breath sounds are clear and equal bilaterally. No wheezes/rales/rhonchi. Gastrointestinal:   Soft with pronounced luq tenderness.  Gravid, size consistent with dates.  There is no CVA tenderness.  No rebound, rigidity, or guarding.  Musculoskeletal:   Normal range of motion in all extremities. No joint effusions.  No lower extremity tenderness.  No edema. Neurologic:   Normal speech and language.  Motor grossly intact. No acute focal neurologic deficits are appreciated.  Skin:    Skin is  warm, dry and intact. No rash noted.  No petechiae, purpura, or bullae.  ____________________________________________    LABS (pertinent positives/negatives) (all labs ordered are listed, but only abnormal results are displayed) Labs Reviewed  CBC - Abnormal; Notable for the following components:      Result Value   WBC 15.3 (*)    All other components within normal limits  COMPREHENSIVE METABOLIC PANEL - Abnormal; Notable for the following components:   Potassium 3.3 (*)    CO2 21 (*)    Glucose, Bld 119 (*)    Calcium 8.7 (*)    Albumin 3.2 (*)    Alkaline Phosphatase 134 (*)    All other components within normal limits  URINALYSIS, COMPLETE (UACMP) WITH MICROSCOPIC - Abnormal; Notable for the  following components:   Color, Urine AMBER (*)    APPearance CLEAR (*)    Ketones, ur 5 (*)    Bacteria, UA RARE (*)    All other components within normal limits  URINE CULTURE  TROPONIN I   ____________________________________________   EKG Interpreted by me Sinus tachycardia rate 103, normal axis intervals QRS ST segments and T waves   ____________________________________________    RADIOLOGY  No results found.  ____________________________________________   PROCEDURES Procedures  ____________________________________________    CLINICAL IMPRESSION / ASSESSMENT AND PLAN / ED COURSE  Pertinent labs & imaging results that were available during my care of the patient were reviewed by me and considered in my medical decision making (see chart for details).      Clinical Course as of Jan 06 1847  Sheral Flow Jan 05, 2018  1549 Patient not in distress, presents with left upper quadrant pain under the ribs, consistent with gastritis.  Doubt ACS PE dissection AAA pneumothorax biliary disease obstruction or perforation.  Related to recent viral GI illness and poor oral intake over the last 3 days.  Slight tachycardia is physiologic at this stage in pregnancy.  No evidence of  infectious process.  I will give IV fluids for hydration, Benadryl and Reglan for symptom relief.  I anticipate that she will be stable for discharge home.   [PS]    Clinical Course User Index [PS] Sharman Cheek, MD     ----------------------------------------- 6:58 PM on 01/05/2018 -----------------------------------------  Feels better, symptoms resolved after Benadryl and Maalox.  Stable for discharge home at this time.  ____________________________________________   FINAL CLINICAL IMPRESSION(S) / ED DIAGNOSES    Final diagnoses:  Acute gastritis without hemorrhage, unspecified gastritis type  Upper abdominal pain   ED Discharge Orders         Ordered    sucralfate (CARAFATE) 1 g tablet  4 times daily     01/05/18 1847    ranitidine (ZANTAC) 150 MG capsule  2 times daily     01/05/18 1847          Portions of this note were generated with dragon dictation software. Dictation errors may occur despite best attempts at proofreading.    Sharman Cheek, MD 01/05/18 [phone]

## 2018-01-05 NOTE — ED Notes (Signed)
Pt alert and oriented X4, active, cooperative, pt in NAD. RR even and unlabored, color WNL.  Pt informed to return if any life threatening symptoms occur.  Discharge and followup instructions reviewed. Ambulates safely. 

## 2018-01-05 NOTE — ED Triage Notes (Signed)
L chest pain x 1 hour. [redacted] weeks pregnant. No bleeding or contractions.

## 2018-01-06 ENCOUNTER — Other Ambulatory Visit: Payer: Self-pay

## 2018-01-06 ENCOUNTER — Encounter: Payer: Self-pay | Admitting: Certified Nurse Midwife

## 2018-01-06 ENCOUNTER — Ambulatory Visit (INDEPENDENT_AMBULATORY_CARE_PROVIDER_SITE_OTHER): Payer: BLUE CROSS/BLUE SHIELD | Admitting: Certified Nurse Midwife

## 2018-01-06 VITALS — BP 118/70 | HR 97 | Wt 201.3 lb

## 2018-01-06 DIAGNOSIS — R55 Syncope and collapse: Secondary | ICD-10-CM

## 2018-01-06 DIAGNOSIS — Z3493 Encounter for supervision of normal pregnancy, unspecified, third trimester: Secondary | ICD-10-CM

## 2018-01-06 DIAGNOSIS — Z3A30 30 weeks gestation of pregnancy: Secondary | ICD-10-CM | POA: Diagnosis not present

## 2018-01-06 DIAGNOSIS — Z148 Genetic carrier of other disease: Secondary | ICD-10-CM

## 2018-01-06 DIAGNOSIS — O26893 Other specified pregnancy related conditions, third trimester: Secondary | ICD-10-CM

## 2018-01-06 DIAGNOSIS — R1011 Right upper quadrant pain: Secondary | ICD-10-CM

## 2018-01-06 DIAGNOSIS — O368130 Decreased fetal movements, third trimester, not applicable or unspecified: Secondary | ICD-10-CM | POA: Diagnosis not present

## 2018-01-06 DIAGNOSIS — O26899 Other specified pregnancy related conditions, unspecified trimester: Secondary | ICD-10-CM

## 2018-01-06 DIAGNOSIS — O219 Vomiting of pregnancy, unspecified: Secondary | ICD-10-CM

## 2018-01-06 DIAGNOSIS — O212 Late vomiting of pregnancy: Secondary | ICD-10-CM

## 2018-01-06 LAB — POCT URINALYSIS DIPSTICK OB
BILIRUBIN UA: NEGATIVE
Blood, UA: NEGATIVE
GLUCOSE, UA: NEGATIVE
Leukocytes, UA: NEGATIVE
Nitrite, UA: NEGATIVE
POC,PROTEIN,UA: NEGATIVE
Spec Grav, UA: 1.01 (ref 1.010–1.025)
UROBILINOGEN UA: 0.2 U/dL
pH, UA: 6 (ref 5.0–8.0)

## 2018-01-06 LAB — URINE CULTURE: CULTURE: NO GROWTH

## 2018-01-06 MED ORDER — ONDANSETRON 4 MG PO TBDP: 8.0000 mg | ORAL_TABLET | Freq: Three times a day (TID) | ORAL | 1 refills | Status: DC | PRN

## 2018-01-06 NOTE — Progress Notes (Signed)
Subjective:   Stephanie Hampton is a 22 y.o. G2P1001 [redacted]w[redacted]d being seen today for work in problem obstetrical visit.    Patient reports decreased fetal movement and intermittent right upper quadrant pain.  Denies contractions, vaginal bleeding or leaking of fluid.   Seen at [facility] ER last night and diagnosed with gastritis. Seen by Cardiologist on Monday and recommends referral to [facility] and birth with cardiac monitoring.   Denies difficulty breathing or respiratory distress, chest pain, dysuria, and leg pain or swelling.   The following portions of the patient's history were reviewed and updated as appropriate: allergies, current medications, past family history, past medical history, past social history, past surgical history and problem list.   Objective:   BP 118/70   Pulse 97   Wt 201 lb 4.8 oz (91.3 kg)   LMP 06/07/2017 (Exact Date)   BMI 34.55 kg/m   FHT: Fetal Heart Rate (bpm): 145  Fetal Movement: Movement: Present    Abdomen:  soft, gravid, appropriate for gestational age,tenderness with palpation to right upper quad only   Results for orders placed or performed in visit on 01/06/18 (from the past 24 hour(s))  POC Urinalysis Dipstick OB     Status: None   Collection Time: 01/06/18 10:46 AM  Result Value Ref Range   Color, UA Yellow    Clarity, UA Clear    Glucose, UA Negative Negative   Bilirubin, UA Negative    Ketones, UA Small    Spec Grav, UA 1.010 1.010 - 1.025   Blood, UA Negative    pH, UA 6.0 5.0 - 8.0   POC Protein UA Negative Negative, Trace   Urobilinogen, UA 0.2 0.2 or 1.0 E.U./dL   Nitrite, UA Negative    Leukocytes, UA Negative Negative   Appearance     Odor      Assessment:   Pregnancy:  G2P1001 at [redacted]w[redacted]d  1. Third trimester pregnancy  - POC Urinalysis Dipstick OB - US ABDOMEN LIMITED RUQ; Future - Ambulatory referral to Perinatology  2. Right upper quadrant abdominal pain affecting pregnancy  - US ABDOMEN LIMITED RUQ; Future  3.  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 Syncope, unspecified syncope type  - Ambulatory referral to Perinatology  Plan:   NST performed today was reviewed and was found to be reactive. Baseline 145 bpm with moderate variability, accelerations noted and no decelerations present. Continue recommended antenatal testing and prenatal care.  Discussed home treatment measures. Will schedule Gallbladder US ASAP.   Urgent referral placed to MFM due to syncope and genetic mutation, see orders.   Preterm labor symptoms: vaginal bleeding, contractions and leaking of fluid reviewed in detail.  Fetal movement precautions reviewed.  Follow up in 2 weeks as previously scheduled until transfer of care complete.   Gunnar Bulla, CNM [facility], [facility]

## 2018-01-06 NOTE — Patient Instructions (Addendum)
Low-Fat Diet for Pancreatitis or Gallbladder Conditions A low-fat diet can be helpful if you have pancreatitis or a gallbladder condition. With these conditions, your pancreas and gallbladder have trouble digesting fats. A healthy eating plan with less fat will help rest your pancreas and gallbladder and reduce your symptoms. What do I need to know about this diet?  Eat a low-fat diet. ? Reduce your fat intake to less than 20-30% of your total daily calories. This is less than 50-60 g of fat per day. ? Remember that you need some fat in your diet. Ask your dietician what your daily goal should be. ? Choose nonfat and low-fat healthy foods. Look for the words "nonfat," "low fat," or "fat free." ? As a guide, look on the label and choose foods with less than 3 g of fat per serving. Eat only one serving.  Avoid alcohol.  Do not smoke. If you need help quitting, talk with your health care provider.  Eat small frequent meals instead of three large heavy meals. What foods can I eat? Grains Include healthy grains and starches such as potatoes, wheat bread, fiber-rich cereal, and brown rice. Choose whole grain options whenever possible. In adults, whole grains should account for 45-65% of your daily calories. Fruits and Vegetables Eat plenty of fruits and vegetables. Fresh fruits and vegetables add fiber to your diet. Meats and Other Protein Sources Eat lean meat such as chicken and pork. Trim any fat off of meat before cooking it. Eggs, fish, and beans are other sources of protein. In adults, these foods should account for 10-35% of your daily calories. Dairy Choose low-fat milk and dairy options. Dairy includes fat and protein, as well as calcium. Fats and Oils Limit high-fat foods such as fried foods, sweets, baked goods, sugary drinks. Other Creamy sauces and condiments, such as mayonnaise, can add extra fat. Think about whether or not you need to use them, or use smaller amounts or low fat  options. What foods are not recommended?  High fat foods, such as: ? [employer]. ? Ice cream. ? [place] toast. ? Sweet rolls. ? Pizza. ? Cheese bread. ? Foods covered with batter, butter, creamy sauces, or cheese. ? Fried foods. ? Sugary drinks and desserts.  Foods that cause gas or bloating This information is not intended to replace advice given to you by your health care provider. Make sure you discuss any questions you have with your health care provider. Document Released: 03/23/2013 Document Revised: 08/24/2015 Document Reviewed: 03/01/2013 Elsevier Interactive Patient Education  2017 [employer] Inc.  Fetal Movement Counts Patient Name: ________________________________________________ Patient Due Date: ____________________ What is a fetal movement count? A fetal movement count is the number of times that you feel your baby move during a certain amount of time. This may also be called a fetal kick count. A fetal movement count is recommended for every pregnant woman. You may be asked to start counting fetal movements as early as week 28 of your pregnancy. Pay attention to when your baby is most active. You may notice your baby's sleep and wake cycles. You may also notice things that make your baby move more. You should do a fetal movement count:  When your baby is normally most active.  At the same time each day.  A good time to count movements is while you are resting, after having something to eat and drink. How do I count fetal movements? 1. Find a quiet, comfortable area. Sit, or lie down on your  side. 2. Write down the date, the start time and stop time, and the number of movements that you felt between those two times. Take this information with you to your health care visits. 3. For 2 hours, count kicks, flutters, swishes, rolls, and jabs. You should feel at least 10 movements during 2 hours. 4. You may stop counting after you have felt 10 movements. 5. If you do not  feel 10 movements in 2 hours, have something to eat and drink. Then, keep resting and counting for 1 hour. If you feel at least 4 movements during that hour, you may stop counting. Contact a health care provider if:  You feel fewer than 4 movements in 2 hours.  Your baby is not moving like he or she usually does. Date: ____________ Start time: ____________ Stop time: ____________ Movements: ____________ Date: ____________ Start time: ____________ Stop time: ____________ Movements: ____________ Date: ____________ Start time: ____________ Stop time: ____________ Movements: ____________ Date: ____________ Start time: ____________ Stop time: ____________ Movements: ____________ Date: ____________ Start time: ____________ Stop time: ____________ Movements: ____________ Date: ____________ Start time: ____________ Stop time: ____________ Movements: ____________ Date: ____________ Start time: ____________ Stop time: ____________ Movements: ____________ Date: ____________ Start time: ____________ Stop time: ____________ Movements: ____________ Date: ____________ Start time: ____________ Stop time: ____________ Movements: ____________ This information is not intended to replace advice given to you by your health care provider. Make sure you discuss any questions you have with your health care provider. Document Released: 04/17/2006 Document Revised: 11/15/2015 Document Reviewed: 04/27/2015 Elsevier Interactive Patient Education  [employer].

## 2018-01-06 NOTE — Progress Notes (Signed)
Patient c/o decreased FM since 2am, had some FM on the way to the office.

## 2018-01-07 ENCOUNTER — Encounter: Payer: BLUE CROSS/BLUE SHIELD | Admitting: Certified Nurse Midwife

## 2018-01-08 ENCOUNTER — Ambulatory Visit
Admission: RE | Admit: 2018-01-08 | Discharge: 2018-01-08 | Disposition: A | Discharge status: Home / Self Care | Payer: BLUE CROSS/BLUE SHIELD | Source: Ambulatory Visit | Attending: Obstetrics and Gynecology | Admitting: Obstetrics and Gynecology

## 2018-01-08 DIAGNOSIS — R55 Syncope and collapse: Secondary | ICD-10-CM

## 2018-01-08 DIAGNOSIS — R1011 Right upper quadrant pain: Secondary | ICD-10-CM

## 2018-01-08 DIAGNOSIS — Z8249 Family history of ischemic heart disease and other diseases of the circulatory system: Secondary | ICD-10-CM

## 2018-01-08 NOTE — Progress Notes (Signed)
[facility] Consultation   Chief Complaint: Recurrent syncope during pregnancy   HPI: [name] is a 22 y.o. G2P1001 MWF at 4 5/7 by LMP and  Early u/s on 08/08/17 at [facility] who presents in consultation from [facility]  for recurrent syncope. The patient is accompanied by her mother. The patient says that both this pregnancy and her first pregnancy have been complicated by persistent nausea requiring many ER visit s and use of phenergan and Zofran. This pregnancy has been different due to fainting episodes 2-3 /week . The pt has been hydrating but notes that she will start to feel hot and feel her heart race and things go dark. If she can lie down she can keep from fainting  But even moving to sitting sometimes isn't enough at work she fainted once after moving to a seated position. She has gone out on her FMLA due to the recurrent symptoms . She initially thought just related to pregnancy but her father and several of his relatives have developed syncope in their 3s as their first cardiac symptoms. Several have required pacemakers. Her paternal grandmother died in her mid 48s due to cardiac issues and her Dad recently underwent a heart transplant at age 68 at 23. He is experiencing liver issues but is otherwise doing better. He was diagnosed with a mutation in a muscle gene called " DES". The patient underwent testing through [facility] that also returned positive. She has been seen by Dr Aundria Rud at [facility] who cares for her father. A TTE echo was normal and her holter only showed sinus tach with her episodes of pre-syncope. The patient is concerned but not overly stressed by the genetic test- she has decided to delay testing her kids .  She has limited her driving to short distances to drop off her 24 yo - she has not had any episodes while driving.  She stays at her mom's house during the day . She works at [employer] and was doing well until she experienced RUQ pain and vomiting  and diarrhea last week and went back to the ER - she is on a low fat diet and is doing better . Sh ehas an appointment for a RUQ u/s to evaluate her gallbladder.  She notes on days when she feels badly her diastolic BP can run really low in 35-45 range on the automatic BP cuff.  She is considering a tubal ligation - she has a little girl and this is a boy and the pregnancy has been very hard. Her mother agrees that the patient has thought this through. The patient says her husband understands about her genetic test .  ROS N&V, RUQ pain improving, fainting , constipation , no h/o trunk or other muscle issues   Past Medical History: Patient  has a past medical history of Depression, Endometriosis, and Ovarian cyst.  Past Surgical History: She  has a past surgical history that includes No past surgeries and Laparoscopic ovarian cystectomy (Left, 01/22/2016).  Obstetric History:  OB History    Gravida  3   Para  1   Term  1   Preterm      AB      Living  1     SAB      TAB      Ectopic      Multiple      Live Births  1          SVD at age 38 complicated  by N&V  Pt had an epidural - she says she got too many pain meds and a sleeping tablet in early labor and has forgotten a lot of delivery experience whish is a disappointment  7lbs 3 oz female heatlhy Gynecologic History:  Patient's last menstrual period was 06/07/2017 (exact date).  H/o pelvic pain s/p laparoscopy showing endometriosis 2-3 years ago   Medications:   Current Outpatient Medications:  .  Albuterol Sulfate 108 (90 Base) MCG/ACT AEPB, Inhale into the lungs., Disp: , Rfl:  .  docusate sodium (COLACE) 100 MG capsule, Take 1 capsule (100 mg total) by mouth daily., Disp: 30 capsule, Rfl: 2 .  ondansetron (ZOFRAN-ODT) 4 MG disintegrating tablet, Take 2 tablets (8 mg total) by mouth every 8 (eight) hours as needed for nausea or vomiting., Disp: 30 tablet, Rfl: 1 .  Prenatal Vit-Fe Fumarate-FA (PRENATAL PO), Take 1  tablet by mouth every evening., Disp: , Rfl:  .  promethazine (PHENERGAN) 25 MG tablet, Take 1 tablet (25 mg total) by mouth every 6 (six) hours as needed for nausea or vomiting., Disp: 30 tablet, Rfl: 2 .  ranitidine (ZANTAC) 150 MG capsule, Take 1 capsule (150 mg total) by mouth 2 (two) times daily., Disp: 28 capsule, Rfl: 0 .  sucralfate (CARAFATE) 1 g tablet, Take 1 tablet (1 g total) by mouth 4 (four) times daily., Disp: 120 tablet, Rfl: 1 Allergies: Patient is allergic to augmentin [amoxicillin-pot clavulanate]. rash not with other PCN derivatives  Social History: Patient  reports that she has never smoked. She has never used smokeless tobacco. She reports that she does not drink alcohol or use drugs.  Family History: family history includes Heart disease in her father.    Physical Exam: Today's Vitals   01/08/18 1050  BP: 132/75  Pulse: 100  Resp: 18  Temp: 98.3 F (36.8 C)  TempSrc: Oral  SpO2: 96%  Weight: 90.6 kg  Height: 5' 4.8" (1.646 m)   Body mass index is 33.45 kg/m.  Mildly overweight WF  Gravid abdomen   Asessement: IUP at [redacted]w[redacted]d  1. Syncope, unspecified syncope type   2. Family history of cardiac disorder   3. N&V on phenergan and zofran 4 obesity BMI 33 5 constipation related to zofran/pregnancy 6 RUQ pain ? 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 Continue with oral hydration and antiemetics  2 agree with removing from work so she can control her sxs by lying down when necessary 3 report to ER or hospital for inability to hydrate or control sxs at home since risk of hitting abdomen will increase risk of abruption as pregnancy progresses 4 agree with plan to w/u RUQ pain with u/s  5 Transfer to [facility] for delivery  planning, I recommended IOL at 39 weeks - 12/7 is a Saturday perhaps 03/09/18 to allow intrapartum cardiac consultation  We will schedule appointment in [facility] and appointment with OB anesthesia  If pt with increasing syncope consider earlier IOL  Pt desires to breast feed, mirena IUD considering interval BTL - I told her given her increase in sxs with pregnancy it might be advisable to allow resolution of her sxs before undertaking elective surgery.  I reviewed the additional work of pregnancy and labor pregnancy presents to the heart and the challenges  for the remodeling  heart in the postpartum period and her need to follow up as specified.     Total time spent with the patient was 30 minutes with greater than 50% spent in counseling and coordination of care. We appreciate this interesting consult and will be happy to be involved in the ongoing care of Ms. Beckstrand in anyway her obstetricians desire.  Jimmey Ralph, MD Maternal-Fetal Medicine [facility]

## 2018-01-12 ENCOUNTER — Encounter: Payer: Self-pay | Admitting: Certified Nurse Midwife

## 2018-01-12 ENCOUNTER — Ambulatory Visit
Admission: RE | Admit: 2018-01-12 | Discharge: 2018-01-12 | Disposition: A | Discharge status: Home / Self Care | Payer: BLUE CROSS/BLUE SHIELD | Source: Ambulatory Visit | Attending: Certified Nurse Midwife | Admitting: Certified Nurse Midwife

## 2018-01-12 DIAGNOSIS — O26899 Other specified pregnancy related conditions, unspecified trimester: Secondary | ICD-10-CM

## 2018-01-12 DIAGNOSIS — O219 Vomiting of pregnancy, unspecified: Secondary | ICD-10-CM | POA: Diagnosis not present

## 2018-01-12 DIAGNOSIS — R1011 Right upper quadrant pain: Secondary | ICD-10-CM | POA: Insufficient documentation

## 2018-01-12 DIAGNOSIS — Z3A00 Weeks of gestation of pregnancy not specified: Secondary | ICD-10-CM | POA: Insufficient documentation

## 2018-01-12 DIAGNOSIS — K8020 Calculus of gallbladder without cholecystitis without obstruction: Secondary | ICD-10-CM | POA: Insufficient documentation

## 2018-01-12 DIAGNOSIS — O26893 Other specified pregnancy related conditions, third trimester: Secondary | ICD-10-CM | POA: Diagnosis not present

## 2018-01-12 DIAGNOSIS — Z3493 Encounter for supervision of normal pregnancy, unspecified, third trimester: Secondary | ICD-10-CM

## 2018-01-19 DIAGNOSIS — R002 Palpitations: Secondary | ICD-10-CM | POA: Diagnosis not present

## 2018-01-19 DIAGNOSIS — R55 Syncope and collapse: Secondary | ICD-10-CM | POA: Diagnosis not present

## 2018-01-19 DIAGNOSIS — I493 Ventricular premature depolarization: Secondary | ICD-10-CM | POA: Diagnosis not present

## 2018-01-21 ENCOUNTER — Encounter: Payer: BLUE CROSS/BLUE SHIELD | Admitting: Certified Nurse Midwife

## 2018-01-27 DIAGNOSIS — R55 Syncope and collapse: Secondary | ICD-10-CM | POA: Diagnosis not present

## 2018-01-27 DIAGNOSIS — Z880 Allergy status to penicillin: Secondary | ICD-10-CM | POA: Diagnosis not present

## 2018-01-27 DIAGNOSIS — Z79899 Other long term (current) drug therapy: Secondary | ICD-10-CM | POA: Diagnosis not present

## 2018-01-27 DIAGNOSIS — Z881 Allergy status to other antibiotic agents status: Secondary | ICD-10-CM | POA: Diagnosis not present

## 2018-01-27 DIAGNOSIS — Z8249 Family history of ischemic heart disease and other diseases of the circulatory system: Secondary | ICD-10-CM | POA: Diagnosis not present

## 2018-01-27 DIAGNOSIS — O99353 Diseases of the nervous system complicating pregnancy, third trimester: Secondary | ICD-10-CM | POA: Diagnosis not present

## 2018-01-27 DIAGNOSIS — Z363 Encounter for antenatal screening for malformations: Secondary | ICD-10-CM | POA: Diagnosis not present

## 2018-01-27 DIAGNOSIS — R569 Unspecified convulsions: Secondary | ICD-10-CM | POA: Diagnosis not present

## 2018-01-27 DIAGNOSIS — G43909 Migraine, unspecified, not intractable, without status migrainosus: Secondary | ICD-10-CM | POA: Diagnosis not present

## 2018-01-27 DIAGNOSIS — O99413 Diseases of the circulatory system complicating pregnancy, third trimester: Secondary | ICD-10-CM | POA: Diagnosis not present

## 2018-01-27 DIAGNOSIS — O359XX0 Maternal care for (suspected) fetal abnormality and damage, unspecified, not applicable or unspecified: Secondary | ICD-10-CM | POA: Diagnosis not present

## 2018-01-27 DIAGNOSIS — O9989 Other specified diseases and conditions complicating pregnancy, childbirth and the puerperium: Secondary | ICD-10-CM | POA: Diagnosis not present

## 2018-01-27 DIAGNOSIS — Z3A33 33 weeks gestation of pregnancy: Secondary | ICD-10-CM | POA: Diagnosis not present

## 2018-01-28 DIAGNOSIS — R55 Syncope and collapse: Secondary | ICD-10-CM | POA: Diagnosis not present

## 2018-01-28 DIAGNOSIS — Z3A33 33 weeks gestation of pregnancy: Secondary | ICD-10-CM | POA: Diagnosis not present

## 2018-01-28 DIAGNOSIS — Z8249 Family history of ischemic heart disease and other diseases of the circulatory system: Secondary | ICD-10-CM | POA: Diagnosis not present

## 2018-01-28 DIAGNOSIS — O9989 Other specified diseases and conditions complicating pregnancy, childbirth and the puerperium: Secondary | ICD-10-CM | POA: Diagnosis not present

## 2018-01-29 DIAGNOSIS — R569 Unspecified convulsions: Secondary | ICD-10-CM | POA: Diagnosis not present

## 2018-01-29 DIAGNOSIS — Z3A33 33 weeks gestation of pregnancy: Secondary | ICD-10-CM | POA: Diagnosis not present

## 2018-01-29 DIAGNOSIS — O9989 Other specified diseases and conditions complicating pregnancy, childbirth and the puerperium: Secondary | ICD-10-CM | POA: Diagnosis not present

## 2018-01-29 DIAGNOSIS — R55 Syncope and collapse: Secondary | ICD-10-CM | POA: Diagnosis not present

## 2018-01-30 DIAGNOSIS — O9989 Other specified diseases and conditions complicating pregnancy, childbirth and the puerperium: Secondary | ICD-10-CM | POA: Diagnosis not present

## 2018-01-30 DIAGNOSIS — R569 Unspecified convulsions: Secondary | ICD-10-CM | POA: Diagnosis not present

## 2018-01-30 DIAGNOSIS — Z3A33 33 weeks gestation of pregnancy: Secondary | ICD-10-CM | POA: Diagnosis not present

## 2018-01-30 DIAGNOSIS — R55 Syncope and collapse: Secondary | ICD-10-CM | POA: Diagnosis not present

## 2018-01-30 DIAGNOSIS — Z8249 Family history of ischemic heart disease and other diseases of the circulatory system: Secondary | ICD-10-CM | POA: Diagnosis not present

## 2018-01-31 DIAGNOSIS — R55 Syncope and collapse: Secondary | ICD-10-CM | POA: Diagnosis not present

## 2018-01-31 DIAGNOSIS — O9989 Other specified diseases and conditions complicating pregnancy, childbirth and the puerperium: Secondary | ICD-10-CM | POA: Diagnosis not present

## 2018-01-31 DIAGNOSIS — Z8249 Family history of ischemic heart disease and other diseases of the circulatory system: Secondary | ICD-10-CM | POA: Diagnosis not present

## 2018-01-31 DIAGNOSIS — Z3A33 33 weeks gestation of pregnancy: Secondary | ICD-10-CM | POA: Diagnosis not present

## 2018-02-04 DIAGNOSIS — R55 Syncope and collapse: Secondary | ICD-10-CM | POA: Diagnosis not present

## 2018-02-13 DIAGNOSIS — Z3A35 35 weeks gestation of pregnancy: Secondary | ICD-10-CM | POA: Diagnosis not present

## 2018-02-17 DIAGNOSIS — Z3A36 36 weeks gestation of pregnancy: Secondary | ICD-10-CM | POA: Diagnosis not present

## 2018-02-17 DIAGNOSIS — K8080 Other cholelithiasis without obstruction: Secondary | ICD-10-CM | POA: Diagnosis not present

## 2018-02-17 DIAGNOSIS — O26893 Other specified pregnancy related conditions, third trimester: Secondary | ICD-10-CM | POA: Diagnosis not present

## 2018-02-17 DIAGNOSIS — R1011 Right upper quadrant pain: Secondary | ICD-10-CM | POA: Diagnosis not present

## 2018-02-17 DIAGNOSIS — R110 Nausea: Secondary | ICD-10-CM | POA: Diagnosis not present

## 2018-02-24 DIAGNOSIS — O9989 Other specified diseases and conditions complicating pregnancy, childbirth and the puerperium: Secondary | ICD-10-CM | POA: Diagnosis not present

## 2018-02-24 DIAGNOSIS — N1330 Unspecified hydronephrosis: Secondary | ICD-10-CM | POA: Diagnosis not present

## 2018-02-24 DIAGNOSIS — K8070 Calculus of gallbladder and bile duct without cholecystitis without obstruction: Secondary | ICD-10-CM | POA: Diagnosis not present

## 2018-02-24 DIAGNOSIS — K8020 Calculus of gallbladder without cholecystitis without obstruction: Secondary | ICD-10-CM | POA: Diagnosis not present

## 2018-02-24 DIAGNOSIS — O26613 Liver and biliary tract disorders in pregnancy, third trimester: Secondary | ICD-10-CM | POA: Diagnosis not present

## 2018-02-24 DIAGNOSIS — Z881 Allergy status to other antibiotic agents status: Secondary | ICD-10-CM | POA: Diagnosis not present

## 2018-02-24 DIAGNOSIS — K8050 Calculus of bile duct without cholangitis or cholecystitis without obstruction: Secondary | ICD-10-CM | POA: Diagnosis not present

## 2018-02-24 DIAGNOSIS — O99613 Diseases of the digestive system complicating pregnancy, third trimester: Secondary | ICD-10-CM | POA: Diagnosis not present

## 2018-02-24 DIAGNOSIS — O2662 Liver and biliary tract disorders in childbirth: Secondary | ICD-10-CM | POA: Diagnosis not present

## 2018-02-24 DIAGNOSIS — Z3A37 37 weeks gestation of pregnancy: Secondary | ICD-10-CM | POA: Diagnosis not present

## 2018-02-24 DIAGNOSIS — Z880 Allergy status to penicillin: Secondary | ICD-10-CM | POA: Diagnosis not present

## 2018-02-25 DIAGNOSIS — O2662 Liver and biliary tract disorders in childbirth: Secondary | ICD-10-CM | POA: Diagnosis not present

## 2018-02-25 DIAGNOSIS — Z3A37 37 weeks gestation of pregnancy: Secondary | ICD-10-CM | POA: Diagnosis not present

## 2018-03-03 DIAGNOSIS — N61 Mastitis without abscess: Secondary | ICD-10-CM | POA: Diagnosis not present

## 2018-03-05 DIAGNOSIS — R55 Syncope and collapse: Secondary | ICD-10-CM | POA: Diagnosis not present

## 2018-03-06 ENCOUNTER — Encounter: Payer: Self-pay | Admitting: Emergency Medicine

## 2018-03-06 ENCOUNTER — Other Ambulatory Visit: Payer: Self-pay

## 2018-03-06 DIAGNOSIS — N939 Abnormal uterine and vaginal bleeding, unspecified: Secondary | ICD-10-CM | POA: Diagnosis not present

## 2018-03-06 DIAGNOSIS — Z5321 Procedure and treatment not carried out due to patient leaving prior to being seen by health care provider: Secondary | ICD-10-CM | POA: Insufficient documentation

## 2018-03-06 LAB — BASIC METABOLIC PANEL
ANION GAP: 10 (ref 5–15)
BUN: 16 mg/dL (ref 6–20)
CHLORIDE: 104 mmol/L (ref 98–111)
CO2: 24 mmol/L (ref 22–32)
Calcium: 9.6 mg/dL (ref 8.9–10.3)
Creatinine, Ser: 0.81 mg/dL (ref 0.44–1.00)
GFR calc Af Amer: >60 mL/min (ref >60)
GFR calc non Af Amer: >60 mL/min (ref >60)
Glucose, Bld: 94 mg/dL (ref 70–99)
Potassium: 4.3 mmol/L (ref 3.5–5.1)
Sodium: 138 mmol/L (ref 135–145)

## 2018-03-06 LAB — CBC WITH DIFFERENTIAL/PLATELET
Abs Immature Granulocytes: 0.04 10*3/uL (ref 0.00–0.07)
Basophils Absolute: 0 10*3/uL (ref 0.0–0.1)
Basophils Relative: 0 %
Eosinophils Absolute: 0.4 10*3/uL (ref 0.0–0.5)
Eosinophils Relative: 4 %
HEMATOCRIT: 42.2 % (ref 36.0–46.0)
Hemoglobin: 13.5 g/dL (ref 12.0–15.0)
Immature Granulocytes: 0 %
LYMPHS ABS: 2.7 10*3/uL (ref 0.7–4.0)
Lymphocytes Relative: 28 %
MCH: 28.7 pg (ref 26.0–34.0)
MCHC: 32 g/dL (ref 30.0–36.0)
MCV: 89.8 fL (ref 80.0–100.0)
Monocytes Absolute: 0.9 10*3/uL (ref 0.1–1.0)
Monocytes Relative: 9 %
Neutro Abs: 5.6 10*3/uL (ref 1.7–7.7)
Neutrophils Relative %: 59 %
Platelets: 417 10*3/uL — ABNORMAL HIGH (ref 150–400)
RBC: 4.7 MIL/uL (ref 3.87–5.11)
RDW: 13.9 % (ref 11.5–15.5)
WBC: 9.6 10*3/uL (ref 4.0–10.5)
nRBC: 0 % (ref 0.0–0.2)

## 2018-03-06 NOTE — ED Triage Notes (Signed)
Pt arrives ambulatory to triage with c/o vaginal bleeding x 1 hour. Pt states that when she got home this evening, she was having blood "just pouring out". Pt is x9 days post partum. Pt was seen by her OB GYN on Tuesday and they did an US and told her that they did not believe that there was any placenta. Pt reports that with this pregnancy she had some near syncopal episodes but no other complications. Pt is breast feeding her baby. Pt is in NAD.

## 2018-03-07 ENCOUNTER — Emergency Department
Admission: EM | Admit: 2018-03-07 | Discharge: 2018-03-07 | Disposition: A | Discharge status: Home / Self Care | Payer: BLUE CROSS/BLUE SHIELD | Attending: Emergency Medicine | Admitting: Emergency Medicine

## 2018-03-07 LAB — TYPE AND SCREEN
ABO/RH(D): A POS
Antibody Screen: NEGATIVE

## 2018-03-07 LAB — HCG, QUANTITATIVE, PREGNANCY: hCG, Beta Chain, Quant, S: 7 m[IU]/mL — ABNORMAL HIGH (ref <5)

## 2018-03-07 NOTE — ED Notes (Signed)
Pt up to stat desk to advise staff that she [facility]is leaving, [facility] pt armband cut off by registration and this tech advised pt that she would let first nurse know

## 2018-03-10 ENCOUNTER — Telehealth: Payer: Self-pay | Admitting: Emergency Medicine

## 2018-03-10 NOTE — Telephone Encounter (Signed)
Called patient due to lwot to inquire about condition and follow up plans. Left message.   

## 2018-03-27 DIAGNOSIS — O99345 Other mental disorders complicating the puerperium: Secondary | ICD-10-CM | POA: Diagnosis not present

## 2018-03-27 DIAGNOSIS — F418 Other specified anxiety disorders: Secondary | ICD-10-CM | POA: Diagnosis not present

## 2018-03-27 DIAGNOSIS — K8020 Calculus of gallbladder without cholecystitis without obstruction: Secondary | ICD-10-CM | POA: Diagnosis not present

## 2018-03-30 DIAGNOSIS — K8020 Calculus of gallbladder without cholecystitis without obstruction: Secondary | ICD-10-CM | POA: Diagnosis not present

## 2018-04-06 DIAGNOSIS — N809 Endometriosis, unspecified: Secondary | ICD-10-CM | POA: Diagnosis not present

## 2018-04-06 DIAGNOSIS — Z6833 Body mass index (BMI) 33.0-33.9, adult: Secondary | ICD-10-CM | POA: Diagnosis not present

## 2018-04-06 DIAGNOSIS — K8020 Calculus of gallbladder without cholecystitis without obstruction: Secondary | ICD-10-CM | POA: Diagnosis not present

## 2018-04-06 DIAGNOSIS — I493 Ventricular premature depolarization: Secondary | ICD-10-CM | POA: Diagnosis not present

## 2018-04-06 DIAGNOSIS — Z01818 Encounter for other preprocedural examination: Secondary | ICD-10-CM | POA: Diagnosis not present

## 2018-04-07 DIAGNOSIS — K8020 Calculus of gallbladder without cholecystitis without obstruction: Secondary | ICD-10-CM | POA: Diagnosis not present

## 2018-04-07 DIAGNOSIS — K219 Gastro-esophageal reflux disease without esophagitis: Secondary | ICD-10-CM | POA: Diagnosis not present

## 2018-04-07 DIAGNOSIS — Z79899 Other long term (current) drug therapy: Secondary | ICD-10-CM | POA: Diagnosis not present

## 2018-04-07 DIAGNOSIS — K8010 Calculus of gallbladder with chronic cholecystitis without obstruction: Secondary | ICD-10-CM | POA: Diagnosis not present

## 2018-04-13 DIAGNOSIS — R740 Nonspecific elevation of levels of transaminase and lactic acid dehydrogenase [LDH]: Secondary | ICD-10-CM | POA: Diagnosis not present

## 2018-04-13 DIAGNOSIS — G8918 Other acute postprocedural pain: Secondary | ICD-10-CM | POA: Diagnosis not present

## 2018-04-13 DIAGNOSIS — K5900 Constipation, unspecified: Secondary | ICD-10-CM | POA: Diagnosis not present

## 2018-04-13 DIAGNOSIS — J9811 Atelectasis: Secondary | ICD-10-CM | POA: Diagnosis not present

## 2018-04-13 DIAGNOSIS — R000 Tachycardia, unspecified: Secondary | ICD-10-CM | POA: Diagnosis not present

## 2018-04-13 DIAGNOSIS — K838 Other specified diseases of biliary tract: Secondary | ICD-10-CM | POA: Diagnosis not present

## 2018-04-13 DIAGNOSIS — R918 Other nonspecific abnormal finding of lung field: Secondary | ICD-10-CM | POA: Diagnosis not present

## 2018-04-13 DIAGNOSIS — R079 Chest pain, unspecified: Secondary | ICD-10-CM | POA: Diagnosis not present

## 2018-04-13 DIAGNOSIS — R1011 Right upper quadrant pain: Secondary | ICD-10-CM | POA: Diagnosis not present

## 2018-04-13 DIAGNOSIS — R748 Abnormal levels of other serum enzymes: Secondary | ICD-10-CM | POA: Diagnosis not present

## 2018-04-13 DIAGNOSIS — Z9049 Acquired absence of other specified parts of digestive tract: Secondary | ICD-10-CM | POA: Diagnosis not present

## 2018-04-13 DIAGNOSIS — R5383 Other fatigue: Secondary | ICD-10-CM | POA: Diagnosis not present

## 2018-04-13 DIAGNOSIS — R400 Somnolence: Secondary | ICD-10-CM | POA: Diagnosis not present

## 2018-04-13 DIAGNOSIS — R509 Fever, unspecified: Secondary | ICD-10-CM | POA: Diagnosis not present

## 2018-04-13 DIAGNOSIS — K8020 Calculus of gallbladder without cholecystitis without obstruction: Secondary | ICD-10-CM | POA: Diagnosis not present

## 2018-04-13 DIAGNOSIS — R4182 Altered mental status, unspecified: Secondary | ICD-10-CM | POA: Diagnosis not present

## 2018-04-14 DIAGNOSIS — Z9049 Acquired absence of other specified parts of digestive tract: Secondary | ICD-10-CM | POA: Diagnosis not present

## 2018-04-14 DIAGNOSIS — R748 Abnormal levels of other serum enzymes: Secondary | ICD-10-CM | POA: Diagnosis not present

## 2018-04-14 DIAGNOSIS — K8020 Calculus of gallbladder without cholecystitis without obstruction: Secondary | ICD-10-CM | POA: Diagnosis not present

## 2018-04-14 DIAGNOSIS — K838 Other specified diseases of biliary tract: Secondary | ICD-10-CM | POA: Diagnosis not present

## 2018-04-14 DIAGNOSIS — R1011 Right upper quadrant pain: Secondary | ICD-10-CM | POA: Diagnosis not present

## 2018-04-14 DIAGNOSIS — R740 Nonspecific elevation of levels of transaminase and lactic acid dehydrogenase [LDH]: Secondary | ICD-10-CM | POA: Diagnosis not present

## 2018-04-14 DIAGNOSIS — G8918 Other acute postprocedural pain: Secondary | ICD-10-CM | POA: Diagnosis not present

## 2018-04-15 DIAGNOSIS — R1011 Right upper quadrant pain: Secondary | ICD-10-CM | POA: Diagnosis not present

## 2018-04-15 DIAGNOSIS — G8918 Other acute postprocedural pain: Secondary | ICD-10-CM | POA: Diagnosis not present

## 2018-04-15 DIAGNOSIS — K8020 Calculus of gallbladder without cholecystitis without obstruction: Secondary | ICD-10-CM | POA: Diagnosis not present

## 2018-04-15 DIAGNOSIS — Z9049 Acquired absence of other specified parts of digestive tract: Secondary | ICD-10-CM | POA: Diagnosis not present

## 2018-08-19 DIAGNOSIS — S93601A Unspecified sprain of right foot, initial encounter: Secondary | ICD-10-CM | POA: Diagnosis not present

## 2018-09-14 DIAGNOSIS — R55 Syncope and collapse: Secondary | ICD-10-CM | POA: Diagnosis not present

## 2018-11-15 DIAGNOSIS — R05 Cough: Secondary | ICD-10-CM | POA: Diagnosis not present

## 2018-11-15 DIAGNOSIS — Z03818 Encounter for observation for suspected exposure to other biological agents ruled out: Secondary | ICD-10-CM | POA: Diagnosis not present

## 2019-03-10 ENCOUNTER — Other Ambulatory Visit: Payer: Self-pay

## 2019-03-10 DIAGNOSIS — Z20822 Contact with and (suspected) exposure to covid-19: Secondary | ICD-10-CM

## 2019-03-12 LAB — NOVEL CORONAVIRUS, NAA: SARS-CoV-2, NAA: NOT DETECTED

## 2019-04-13 ENCOUNTER — Other Ambulatory Visit: Payer: BLUE CROSS/BLUE SHIELD

## 2019-04-26 ENCOUNTER — Encounter: Payer: Self-pay | Admitting: Certified Nurse Midwife

## 2019-04-26 ENCOUNTER — Ambulatory Visit (INDEPENDENT_AMBULATORY_CARE_PROVIDER_SITE_OTHER): Payer: Self-pay | Admitting: Certified Nurse Midwife

## 2019-04-26 ENCOUNTER — Other Ambulatory Visit: Payer: Self-pay

## 2019-04-26 VITALS — BP 116/77 | HR 79 | Ht 64.0 in | Wt 213.3 lb

## 2019-04-26 DIAGNOSIS — Z3009 Encounter for other general counseling and advice on contraception: Secondary | ICD-10-CM

## 2019-04-26 DIAGNOSIS — Z23 Encounter for immunization: Secondary | ICD-10-CM

## 2019-04-26 DIAGNOSIS — Z3202 Encounter for pregnancy test, result negative: Secondary | ICD-10-CM

## 2019-04-26 LAB — POCT URINE PREGNANCY: Preg Test, Ur: NEGATIVE

## 2019-04-26 NOTE — Patient Instructions (Signed)

## 2019-04-26 NOTE — Progress Notes (Signed)
Subjective:    Stephanie Hampton is a 24 y.o. female who presents for contraception counseling. The patient has no complaints today. The patient is sexually active. Pertinent past medical history: none.  Menstrual History: OB History    Gravida  3   Para  1   Term  1   Preterm      AB      Living  1     SAB      TAB      Ectopic      Multiple      Live Births  1           Patient's last menstrual period was 04/02/2019 (exact date).    The following portions of the patient's history were reviewed and updated as appropriate: allergies, current medications, past family history, past medical history, past social history, past surgical history and problem list.  Review of Systems Pertinent items are noted in HPI.   Objective:    No exam performed today, not indicated for conultation of birth control .   Assessment:    24 y.o., starting IUD, no contraindications.   Plan:    All questions answered. Reviewed BC options including patch, pill, ring, IUD, nexplanon and injections. Pt would like to have IUD . Discussed procedure for placement. Reviwed risks and benefits of IUD and placement. Pt encouraged to take motrin prior to visit for placement. Discussed placing during period to make it easier. She verbalizes and agrees to plan. Follow up prn for placement.  I attest more than 50% of visit was spend discussing birth control options as listed above, discussing IUD procdure, risks, and beneftis. All questions answered. Face to face time 10 min.   Doreene Burke, CNM

## 2019-05-03 ENCOUNTER — Other Ambulatory Visit: Payer: Self-pay

## 2019-05-03 ENCOUNTER — Ambulatory Visit (INDEPENDENT_AMBULATORY_CARE_PROVIDER_SITE_OTHER): Payer: BC Managed Care – PPO | Admitting: Certified Nurse Midwife

## 2019-05-03 ENCOUNTER — Encounter: Payer: Self-pay | Admitting: Certified Nurse Midwife

## 2019-05-03 VITALS — BP 109/75 | HR 81 | Ht 64.0 in | Wt 213.2 lb

## 2019-05-03 DIAGNOSIS — Z30430 Encounter for insertion of intrauterine contraceptive device: Secondary | ICD-10-CM

## 2019-05-03 DIAGNOSIS — Z3202 Encounter for pregnancy test, result negative: Secondary | ICD-10-CM | POA: Diagnosis not present

## 2019-05-03 LAB — POCT URINE PREGNANCY: Preg Test, Ur: NEGATIVE

## 2019-05-03 NOTE — Progress Notes (Signed)
     GYNECOLOGY OFFICE PROCEDURE NOTE  Stephanie Hampton is a 24 y.o. G3P1001 here for Mirena IUD insertion. No GYN concerns.  Last pap smear was on 12/2015 and was normal.  IUD Insertion Procedure Note Patient identified, informed consent performed, consent signed.   Discussed risks of irregular bleeding, cramping, infection, malpositioning or misplacement of the IUD outside the uterus which may require further procedure such as laparoscopy. Also discussed >99% contraception efficacy, increased risk of ectopic pregnancy with failure of method.  Time out was performed.  Urine pregnancy test negative.  Speculum placed in the vagina.  Cervix visualized.  Cleaned with Betadine x 2.  Grasped anteriorly with a single tooth tenaculum.  Uterus sounded to 7.5 cm.Mirena IUD placed per manufacturer's recommendations.  Strings trimmed to 3 cm. Tenaculum was removed, good hemostasis noted.  Patient tolerated procedure well.   Patient was given post-procedure instructions.  She was advised to have backup contraception for one week.  Patient was also asked to check IUD strings periodically and follow up in 4 weeks for IUD check.   Doreene Burke, CNM

## 2019-05-03 NOTE — Patient Instructions (Signed)
Intrauterine Device Insertion, Care After  This sheet gives you information about how to care for yourself after your procedure. Your health care provider may also give you more specific instructions. If you have problems or questions, contact your health care provider. What can I expect after the procedure? After the procedure, it is common to have:  Cramps and pain in the abdomen.  Light bleeding (spotting) or heavier bleeding that is like your menstrual period. This may last for up to a few days.  Lower back pain.  Dizziness.  Headaches.  Nausea. Follow these instructions at home:  Before resuming sexual activity, check to make sure that you can feel the IUD string(s). You should be able to feel the end of the string(s) below the opening of your cervix. If your IUD string is in place, you may resume sexual activity. ? If you had a hormonal IUD inserted more than 7 days after your most recent period started, you will need to use a backup method of birth control for 7 days after IUD insertion. Ask your health care provider whether this applies to you.  Continue to check that the IUD is still in place by feeling for the string(s) after every menstrual period, or once a month.  Take over-the-counter and prescription medicines only as told by your health care provider.  Do not drive or use heavy machinery while taking prescription pain medicine.  Keep all follow-up visits as told by your health care provider. This is important. Contact a health care provider if:  You have bleeding that is heavier or lasts longer than a normal menstrual cycle.  You have a fever.  You have cramps or abdominal pain that get worse or do not get better with medicine.  You develop abdominal pain that is new or is not in the same area of earlier cramping and pain.  You feel lightheaded or weak.  You have abnormal or bad-smelling discharge from your vagina.  You have pain during sexual  activity.  You have any of the following problems with your IUD string(s): ? The string bothers or hurts you or your sexual partner. ? You cannot feel the string. ? The string has gotten longer.  You can feel the IUD in your vagina.  You think you may be pregnant, or you miss your menstrual period.  You think you may have an STI (sexually transmitted infection). Get help right away if:  You have flu-like symptoms.  You have a fever and chills.  You can feel that your IUD has slipped out of place. Summary  After the procedure, it is common to have cramps and pain in the abdomen. It is also common to have light bleeding (spotting) or heavier bleeding that is like your menstrual period.  Continue to check that the IUD is still in place by feeling for the string(s) after every menstrual period, or once a month.  Keep all follow-up visits as told by your health care provider. This is important.  Contact your health care provider if you have problems with your IUD string(s), such as the string getting longer or bothering you or your sexual partner. This information is not intended to replace advice given to you by your health care provider. Make sure you discuss any questions you have with your health care provider. Document Revised: 02/28/2017 Document Reviewed: 02/07/2016 Elsevier Patient Education  2020 [employer] Inc[employer]  

## 2019-05-17 ENCOUNTER — Other Ambulatory Visit: Payer: Self-pay | Admitting: Certified Nurse Midwife

## 2019-05-17 ENCOUNTER — Telehealth: Payer: Self-pay

## 2019-05-17 DIAGNOSIS — N6452 Nipple discharge: Secondary | ICD-10-CM

## 2019-05-17 NOTE — Telephone Encounter (Signed)
Mychart message from patient sent to Doreene Burke CNM

## 2019-05-17 NOTE — Telephone Encounter (Signed)
Pt had her son in 2019. She BF- x 6 weeks. Last BF - 04/2018  Started to produce milk after she received an iud- 05/03/2019.   Breast sore and painful. What can she do? Pls advise.

## 2019-05-18 ENCOUNTER — Other Ambulatory Visit: Payer: BC Managed Care – PPO

## 2019-05-18 ENCOUNTER — Other Ambulatory Visit: Payer: Self-pay

## 2019-05-18 DIAGNOSIS — N6452 Nipple discharge: Secondary | ICD-10-CM

## 2019-05-19 LAB — PROLACTIN: Prolactin: 21.4 ng/mL (ref 4.8–23.3)

## 2019-06-01 ENCOUNTER — Encounter: Payer: Self-pay | Admitting: Certified Nurse Midwife

## 2019-06-01 ENCOUNTER — Other Ambulatory Visit: Payer: Self-pay | Admitting: Certified Nurse Midwife

## 2019-06-01 ENCOUNTER — Other Ambulatory Visit: Payer: Self-pay

## 2019-06-01 ENCOUNTER — Ambulatory Visit (INDEPENDENT_AMBULATORY_CARE_PROVIDER_SITE_OTHER): Payer: BC Managed Care – PPO | Admitting: Certified Nurse Midwife

## 2019-06-01 VITALS — BP 129/84 | HR 82 | Ht 64.0 in | Wt 215.4 lb

## 2019-06-01 DIAGNOSIS — F419 Anxiety disorder, unspecified: Secondary | ICD-10-CM | POA: Diagnosis not present

## 2019-06-01 MED ORDER — CITALOPRAM HYDROBROMIDE 10 MG PO TABS: 10.0000 mg | ORAL_TABLET | Freq: Every day | ORAL | 1 refills | Status: DC

## 2019-06-01 NOTE — Progress Notes (Signed)
  GYNECOLOGY OFFICE ENCOUNTER NOTE  History:  24 y.o. G3P1001 here today for today for IUD string check; Mirena  IUD was placed  05/03/19 No complaints about the IUD, no concerning side effects.  She complains today of anxiety, she is feeling overwhelmed since having her son. She admits that she saw a consoler x 1 but feels like it did not really help.   The following portions of the patient's history were reviewed and updated as appropriate: allergies, current medications, past family history, past medical history, past social history, past surgical history and problem list. Last pap smear on 12/2015 was normal..  Review of Systems:  Pertinent items are noted in HPI.   Objective:  Physical Exam Blood pressure 129/84, pulse 82, height 5\' 4"  (1.626 m), weight 215 lb 7 oz (97.7 kg), last menstrual period 05/24/2019, currently breastfeeding. CONSTITUTIONAL: Well-developed, well-nourished female in no acute distress.  HENT:  Normocephalic, atraumatic. External right and left ear normal. Oropharynx is clear and moist EYES: Conjunctivae and EOM are normal. Pupils are equal, round, and reactive to light. No scleral icterus.  NECK: Normal range of motion, supple, no masses CARDIOVASCULAR: Normal heart rate noted RESPIRATORY: Effort and breath sounds normal, no problems with respiration noted ABDOMEN: Soft, no distention noted.   PELVIC: Normal appearing external genitalia; normal appearing vaginal mucosa and cervix.  IUD strings visualized, about 3 cm in length outside cervix.  GAD 7 : Generalized Anxiety Score 06/01/2019  Nervous, Anxious, on Edge 3  Control/stop worrying 3  Worry too much - different things 3  Trouble relaxing 3  Restless 1  Easily annoyed or irritable 2  Afraid - awful might happen 3  Total GAD 7 Score 18  Anxiety Difficulty Very difficult   PHQ9 SCORE ONLY 06/01/2019  Score 9      Assessment & Plan:  Patient to keep IUD in place for up to seven years; can come in for  removal if she desires pregnancy earlier or for any concerning side effects.   Discussed use of medications and counseling . She request referral to counselor. The one she saw previously was at [facility], She would like someone local. She will discuss use of medication with her partner and let me know if she is interested . Reviewed use of mediation for 3-4 wks then follow up. She will send my chart message if she would like to start.   Follow up 1 month for annual exam.   [facility], CNM

## 2019-06-01 NOTE — Patient Instructions (Signed)

## 2019-06-23 ENCOUNTER — Other Ambulatory Visit: Payer: Self-pay | Admitting: Certified Nurse Midwife

## 2019-07-08 ENCOUNTER — Ambulatory Visit: Payer: Medicaid Other | Admitting: Licensed Clinical Social Worker

## 2019-07-08 ENCOUNTER — Encounter: Payer: Self-pay | Admitting: Licensed Clinical Social Worker

## 2019-07-08 ENCOUNTER — Other Ambulatory Visit: Payer: Self-pay

## 2019-07-08 DIAGNOSIS — F411 Generalized anxiety disorder: Secondary | ICD-10-CM

## 2019-07-08 NOTE — Progress Notes (Signed)
Counselor Initial Adult Exam  Name: Stephanie Hampton Date: 07/08/2019 MRN: [id number] DOB: [birth year] PCP: Purcell Nails, CNM  Time spent:  1 hour   A biopsychosocial was completed on the Patient. Background information and current concerns were obtained during an intake in the office with the [facility] Department clinician, Leanna Battles, LCSW.  Contact information and confidentiality was discussed and appropriate consents were signed.     Reason for Visit /Presenting Problem:  Patient presents with a history of mild chronic depressive symptoms, and a postpartum diagnosis after her oldest child was born nearly 8 years ago. She reports that at that time she was treated with Wellbutrin for about 8 months and benefited. She reports a history of mild anxiety symptoms that she has experienced chronically over the years, but significantly worsened 2 months post delivery of her youngest child in November 2019. She reports that she is currently prescribed Celexa which she began taking in March. Although she reports benefit she also describes some side effects, including feeling drowsy and nausea. She currently describes significant anxiety symptoms, including significant worries/fears of her children's wellbeing, and her own health, worries about a variety of things, difficulties controlling worries, irritability, sleep disturbance - difficulties sleeping through the night, and occasional social anxiety symptoms - worry about being judged and what people she knows think of her.  She reports no significant stressors that she can pin point as to the cause of her symptoms, but does report her symptoms developed after delivery and during the pandemic. 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 She reports that her father also has this gene and she has watched him struggle with health issues for years. In addition, she reports some unresolved  emotional pain associated with her husband having an affair a few years ago.   Mental Status Exam:   Appearance:   Casual     Behavior:  Appropriate and Sharing  Motor:  Normal  Speech/Language:   Normal Rate  Affect:  Appropriate  Mood:  normal  Thought process:  normal  Thought content:    WNL  Sensory/Perceptual disturbances:    WNL  Orientation:  oriented to person, place, time/date, situation and day of week  Attention:  Good  Concentration:  Good  Memory:  WNL  Fund of knowledge:   Good  Insight:    Good  Judgment:   Good  Impulse Control:  Good   Reported Symptoms:  Sleep disturbance and feeling anxious, constant worries, difficulties controling worries, irritability, sleep disturbance; GAD 14  Risk Assessment: Danger to Self:  No Self-injurious Behavior: No Danger to Others: No Duty to Warn:no Physical Aggression / Violence:No  Access to Firearms a concern: No  Gang Involvement:No  Patient / guardian was educated about steps to take if suicide or homicide risk level increases between visits: yes While future psychiatric events cannot be accurately predicted, the patient does not currently require acute inpatient psychiatric care and does not currently meet [facility] involuntary commitment criteria.  Substance Abuse History: Current substance abuse: No     Past Psychiatric History:   Previous psychological history is significant for depression postpartum depression treated with Wellbutrin for 6-8 months;  Mom hx. Of depression; Sister postpartum anxiety  Outpatient Providers: NA History of Psych Hospitalization: No   Abuse History: Victim of Yes.  , emotional   Report needed: No. Victim of Neglect:No. Perpetrator of NA  Witness / Exposure to  Domestic Violence: No   Protective Services Involvement: No  Witness to [employer] Violence:  No   Family History:  Family History  Problem Relation Age of Onset  . Heart disease Father   . Depression Mother   .  Cancer Neg Hx   . Ovarian cancer Neg Hx   . Colon cancer Neg Hx   . Diabetes Neg Hx     Social History:  Social History   Socioeconomic History  . Marital status: Married    Spouse name: Barnabas Lister  . Number of children: 2  . Years of education: 19  . Highest education level: High school graduate  Occupational History  . Occupation: not employeed   Tobacco Use  . Smoking status: Never Smoker  . Smokeless tobacco: Never Used  Substance and Sexual Activity  . Alcohol use: No    Alcohol/week: 0.0 standard drinks  . Drug use: No  . Sexual activity: Yes    Birth control/protection: None    Comment: Possible BTL   Other Topics Concern  . Not on file  Social History Narrative   Patient has been married since 2014 and reprots that overall the marriage is supportive. She has two children 7yo and 65 months old. She has not worked in the past year due to [employer], having a child in virtual school, and having a child that she did not want to put in daycare. She plans to return to work in the near future. She reports having supportive family relationships and having a few friends.    Social Determinants of Health   Financial Resource Strain:   . Difficulty of Paying Living Expenses:   Food Insecurity:   . Worried About Charity fundraiser in the Last Year:   . Arboriculturist in the Last Year:   Transportation Needs:   . Film/video editor (Medical):   Marland Kitchen Lack of Transportation (Non-Medical):   Physical Activity: Sufficiently Active  . Days of Exercise per Week: 4 days  . Minutes of Exercise per Session: 120 min  Stress: Stress Concern Present  . Feeling of Stress : Rather much  Social Connections: Somewhat Isolated  . Frequency of Communication with Friends and Family: More than three times a week  . Frequency of Social Gatherings with Friends and Family: Twice a week  . Attends Religious Services: Never  . Active Member of Clubs or Organizations: No  . Attends Archivist  Meetings: Never  . Marital Status: Married    Living situation: the patient lives with their spouse and their two children   Sexual Orientation:  Straight  Relationship Status: married since 2014  Name of spouse / other: NA              If a parent, number of children / ages: 36yo and 20mo    Support Systems; spouse friends parents  Museum/gallery curator Stress:  Some   Income/Employment/Disability: Unemployment  Armed forces logistics/support/administrative officer: No   Educational History: Education: high school diploma/GED  Religion/Sprituality/World View:   Christianity   Any cultural differences that may affect / interfere with treatment:  not applicable   Recreation/Hobbies: cooking; photography   Stressors:Other: multiple small stressors  Strengths:  Family and Able to Communicate Effectively  Barriers:  NA    Legal History: Pending legal issue / charges: The patient has no significant history of legal issues. History of legal issue / charges: NA  Medical History/Surgical History:reviewed Past Medical History:  Diagnosis Date  . Depression   .  Endometriosis   . Ovarian cyst     Past Surgical History:  Procedure Laterality Date  . CHOLECYSTECTOMY    . LAPAROSCOPIC OVARIAN CYSTECTOMY Left 01/22/2016   Procedure: LAPAROSCOPIC LEFT OVARIAN CYSTECTOMY-POSSIBLE BIOPSIES;  Surgeon: Herold Harms, MD;  Location: [facility] ORS;  Service: Gynecology;  Laterality: Left;  excision/fulgeration of endometriosis  . NO PAST SURGERIES      Medications: Current Outpatient Medications  Medication Sig Dispense Refill  . Albuterol Sulfate 108 (90 Base) MCG/ACT AEPB Inhale into the lungs.    . citalopram (CELEXA) 10 MG tablet TAKE 1 TABLET BY MOUTH EVERY DAY 90 tablet 1  . docusate sodium (COLACE) 100 MG capsule Take 1 capsule (100 mg total) by mouth daily. 30 capsule 2  . etodolac (LODINE) 500 MG tablet Take 500 mg by mouth 2 (two) times daily.    Marland Kitchen levonorgestrel (MIRENA) 20 MCG/24HR IUD 1 each by Intrauterine  route once.    . ondansetron (ZOFRAN-ODT) 4 MG disintegrating tablet Take 2 tablets (8 mg total) by mouth every 8 (eight) hours as needed for nausea or vomiting. 30 tablet 1  . Prenatal Vit-Fe Fumarate-FA (PRENATAL PO) Take 1 tablet by mouth every evening.    . promethazine (PHENERGAN) 25 MG tablet Take 1 tablet (25 mg total) by mouth every 6 (six) hours as needed for nausea or vomiting. 30 tablet 2  . ranitidine (ZANTAC) 150 MG capsule Take 1 capsule (150 mg total) by mouth 2 (two) times daily. 28 capsule 0  . sucralfate (CARAFATE) 1 g tablet Take 1 tablet (1 g total) by mouth 4 (four) times daily. 120 tablet 1  . traMADol (ULTRAM) 50 MG tablet Take 50 mg by mouth 3 (three) times daily as needed.     No current facility-administered medications for this visit.    Allergies  Allergen Reactions  . Augmentin [Amoxicillin-Pot Clavulanate] Hives    Has patient had a PCN reaction causing immediate rash, facial/tongue/throat swelling, SOB or lightheadedness with hypotension: No Has patient had a PCN reaction causing severe rash involving mucus membranes or skin necrosis: No Has patient had a PCN reaction that required hospitalization: No Has patient had a PCN reaction occurring within the last 10 years: Yes If all of the above answers are "NO", then may proceed with Cephalosporin use.    . Metoclopramide Anxiety   JOSLIN DOELL is a 24 y.o. year old female  with a reported history of diagnoses of Major Depressive Disorder, postpartum onset. Patient currently presents with anxiety symptoms and occasional mild depressive symptoms. She describes a history of mild anxiety symptoms that significantly worsened 2 months postpartum (delivery 01/2018). She currently endorsees feeling anxious and worried, difficulties controlling worries, sleep disturbance, irritability, and restlessness. (GAD-7 = 14). Although patient endorsees mild depressive symptoms these symptoms due not currently meet the threshold for  diagnosis. Patient reports that these symptoms impact her functioning in multiple life domains.   Due to the above symptoms and patient's reported history, patient is diagnosed with Generalized Anxiety Disorder. Patient's depressive symptoms should continue to be monitored closely to provide further diagnosis clarification. Continued mental health treatment is needed to address patient's symptoms and monitor her safety and stability. Patient is recommended for continued psychiatric medication management and continued outpatient therapy to further reduce her symptoms and improve her coping strategies.    There is no acute risk for suicide or violence at this time.  While future psychiatric events cannot be accurately predicted, the patient does not require acute inpatient  psychiatric care and does not currently meet [facility] involuntary commitment criteria.   Diagnoses:    ICD-10-CM   1. Generalized anxiety disorder  F41.1     Plan of Care:  Patient's goal: To feel better. To learn how to manage feelings.   -LCSW provided brief psychoeducation on CBTs.  -LCSW and patient agreed to develop treatment plan at next session.  -LCSW and patient discussed in person and zoom sessions weekly or bi-weekly. -Patient to discuss medication side effects with prescriber next week.   Future Appointments  Date Time Provider Department Center  07/13/2019  1:30 PM Doreene Burke, CNM [facility] None  07/15/2019 11:30 AM Kathreen Cosier, LCSW [facility] None    Interpreter used: NA   Kathreen Cosier, LCSW

## 2019-07-13 ENCOUNTER — Ambulatory Visit (INDEPENDENT_AMBULATORY_CARE_PROVIDER_SITE_OTHER): Payer: BC Managed Care – PPO | Admitting: Certified Nurse Midwife

## 2019-07-13 ENCOUNTER — Other Ambulatory Visit (HOSPITAL_COMMUNITY)
Admission: RE | Admit: 2019-07-13 | Discharge: 2019-07-13 | Disposition: A | Discharge status: Home / Self Care | Payer: BC Managed Care – PPO | Source: Ambulatory Visit | Attending: Certified Nurse Midwife | Admitting: Certified Nurse Midwife

## 2019-07-13 ENCOUNTER — Other Ambulatory Visit: Payer: Self-pay

## 2019-07-13 ENCOUNTER — Encounter: Payer: Self-pay | Admitting: Certified Nurse Midwife

## 2019-07-13 VITALS — BP 106/57 | HR 91 | Ht 64.0 in | Wt 212.1 lb

## 2019-07-13 DIAGNOSIS — Z01419 Encounter for gynecological examination (general) (routine) without abnormal findings: Secondary | ICD-10-CM | POA: Diagnosis not present

## 2019-07-13 MED ORDER — CITALOPRAM HYDROBROMIDE 20 MG PO TABS: 20.0000 mg | ORAL_TABLET | Freq: Every day | ORAL | 6 refills | Status: DC

## 2019-07-13 NOTE — Progress Notes (Signed)
GYNECOLOGY ANNUAL PREVENTATIVE CARE ENCOUNTER NOTE  History:     Stephanie Hampton is a 24 y.o. G23P1001 female here for a routine annual gynecologic exam.  Current complaints: anxious.   State the Celexa helps but feels like she is still struggling with this and would like to increase her dose. Denies abnormal vaginal bleeding, discharge, pelvic pain, problems with intercourse or other gynecologic concerns.     Social Married Living: with spouse and children Work: stay at home mom Exercise: walks daily  Drugs/alcohol/smoke: occasiol smoking few x wk. Denies drugs and alcohol.   Gynecologic History Patient's last menstrual period was 07/09/2019 (approximate). Contraception: IUD 2 /1/21 mirena Last Pap: 2017. Per pt. Results were: normal per pt.  Last mammogram: n/a    Obstetric History OB History  Gravida Para Term Preterm AB Living  3 1 1     1   SAB TAB Ectopic Multiple Live Births          1    # Outcome Date GA Lbr Len/2nd Weight Sex Delivery Anes PTL Lv  3 Gravida           2 Term 2014 [redacted]w[redacted]d  7 lb 4.8 oz (3.311 kg) F Vag-Spont   LIV  1 Gravida             Past Medical History:  Diagnosis Date  . Depression   . Endometriosis   . 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   Past Surgical History:  Procedure Laterality Date  . CHOLECYSTECTOMY    . LAPAROSCOPIC OVARIAN CYSTECTOMY Left 01/22/2016   Procedure: LAPAROSCOPIC LEFT OVARIAN CYSTECTOMY-POSSIBLE BIOPSIES;  Surgeon: 01/24/2016, MD;  Location: [facility] ORS;  Service: Gynecology;  Laterality: Left;  excision/fulgeration of endometriosis  . NO PAST SURGERIES      Current Outpatient Medications on File Prior to Visit  Medication Sig Dispense Refill  . citalopram (CELEXA) 10 MG tablet TAKE 1 TABLET BY MOUTH EVERY DAY 90 tablet 1  . levonorgestrel (MIRENA) 20 MCG/24HR IUD 1 each by Intrauterine route once.    . Albuterol Sulfate 108  (90 Base) MCG/ACT AEPB Inhale into the lungs.    . docusate sodium (COLACE) 100 MG capsule Take 1 capsule (100 mg total) by mouth daily. 30 capsule 2  . etodolac (LODINE) 500 MG tablet Take 500 mg by mouth 2 (two) times daily.    . ondansetron (ZOFRAN-ODT) 4 MG disintegrating tablet Take 2 tablets (8 mg total) by mouth every 8 (eight) hours as needed for nausea or vomiting. 30 tablet 1  . Prenatal Vit-Fe Fumarate-FA (PRENATAL PO) Take 1 tablet by mouth every evening.    . promethazine (PHENERGAN) 25 MG tablet Take 1 tablet (25 mg total) by mouth every 6 (six) hours as needed for nausea or vomiting. 30 tablet 2  . ranitidine (ZANTAC) 150 MG capsule Take 1 capsule (150 mg total) by mouth 2 (two) times daily. 28 capsule 0  . sucralfate (CARAFATE) 1 g tablet Take 1 tablet (1 g total) by mouth 4 (four) times daily. 120 tablet 1  . traMADol (ULTRAM) 50 MG tablet Take 50 mg by mouth 3 (three) times daily as needed.     No current facility-administered medications on file prior to visit.    Allergies  Allergen Reactions  . Augmentin [Amoxicillin-Pot Clavulanate] Hives    Has patient had a PCN reaction causing immediate rash, facial/tongue/throat swelling, SOB or  lightheadedness with hypotension: No Has patient had a PCN reaction causing severe rash involving mucus membranes or skin necrosis: No Has patient had a PCN reaction that required hospitalization: No Has patient had a PCN reaction occurring within the last 10 years: Yes If all of the above answers are "NO", then may proceed with Cephalosporin use.    . Metoclopramide Anxiety    Social History:  reports that she has never smoked. She has never used smokeless tobacco. She reports that she does not drink alcohol or use drugs.  Family History  Problem Relation Age of Onset  . Heart disease Father   . Depression Mother   . Cancer Neg Hx   . Ovarian cancer Neg Hx   . Colon cancer Neg Hx   . Diabetes Neg Hx     The following portions  of the patient's history were reviewed and updated as appropriate: allergies, current medications, past family history, past medical history, past social history, past surgical history and problem list.  Review of Systems Pertinent items noted in HPI and remainder of comprehensive ROS otherwise negative.  Physical Exam:  BP (!) 106/57   Pulse 91   Ht 5\' 4"  (1.626 m)   Wt 212 lb 2 oz (96.2 kg)   LMP 07/09/2019 (Approximate)   BMI 36.41 kg/m  CONSTITUTIONAL: Well-developed, well-nourished over weight female in no acute distress.  HENT:  Normocephalic, atraumatic, External right and left ear normal. Oropharynx is clear and moist EYES: Conjunctivae and EOM are normal. Pupils are equal, round, and reactive to light. No scleral icterus.  NECK: Normal range of motion, supple, no masses.  Normal thyroid.  SKIN: Skin is warm and dry. No rash noted. Not diaphoretic. No erythema. No pallor. MUSCULOSKELETAL: Normal range of motion. No tenderness.  No cyanosis, clubbing, or edema.  2+ distal pulses. NEUROLOGIC: Alert and oriented to person, place, and time. Normal reflexes, muscle tone coordination.  PSYCHIATRIC: Normal mood and affect. Normal behavior. Normal judgment and thought content. CARDIOVASCULAR: Normal heart rate noted, regular rhythm RESPIRATORY: Clear to auscultation bilaterally. Effort and breath sounds normal, no problems with respiration noted. BREASTS: Symmetric in size. No masses, tenderness, skin changes, nipple drainage, or lymphadenopathy bilaterally. Performed in the presence of a chaperone. ABDOMEN: Soft, no distention noted.  No tenderness, rebound or guarding.  PELVIC: Normal appearing external genitalia and urethral meatus; normal appearing vaginal mucosa and cervix.  No abnormal discharge noted.  Pap smear obtained. Strings present Normal uterine size, no other palpable masses, no uterine or adnexal tenderness.  Performed in the presence of a chaperone.   Assessment and Plan:      1. Women's annual routine gynecological examination  Will follow up results of pap smear and manage accordingly. Mammogram not indicated Labs: declines Refill: celexa dose increased 20 mg daily Pt see counselor weekly for anxiety/depression management.  Routine preventative health maintenance measures emphasized. Please refer to After Visit Summary for other counseling recommendations.      Philip Aspen, CNM

## 2019-07-13 NOTE — Patient Instructions (Signed)
Preventive Care 21-26 Years Old, Female Preventive care refers to visits with your health care provider and lifestyle choices that can promote health and wellness. This includes:  A yearly physical exam. This may also be called an annual well check.  Regular dental visits and eye exams.  Immunizations.  Screening for certain conditions.  Healthy lifestyle choices, such as eating a healthy diet, getting regular exercise, not using drugs or products that contain nicotine and tobacco, and limiting alcohol use. What can I expect for my preventive care visit? Physical exam Your health care provider will check your:  Height and weight. This may be used to calculate body mass index (BMI), which tells if you are at a healthy weight.  Heart rate and blood pressure.  Skin for abnormal spots. Counseling Your health care provider may ask you questions about your:  Alcohol, tobacco, and drug use.  Emotional well-being.  Home and relationship well-being.  Sexual activity.  Eating habits.  Work and work environment.  Method of birth control.  Menstrual cycle.  Pregnancy history. What immunizations do I need?  Influenza (flu) vaccine  This is recommended every year. Tetanus, diphtheria, and pertussis (Tdap) vaccine  You may need a Td booster every 10 years. Varicella (chickenpox) vaccine  You may need this if you have not been vaccinated. Human papillomavirus (HPV) vaccine  If recommended by your health care provider, you may need three doses over 6 months. Measles, mumps, and rubella (MMR) vaccine  You may need at least one dose of MMR. You may also need a second dose. Meningococcal conjugate (MenACWY) vaccine  One dose is recommended if you are age 19-21 years and a first-year college student living in a residence hall, or if you have one of several medical conditions. You may also need additional booster doses. Pneumococcal conjugate (PCV13) vaccine  You may need  this if you have certain conditions and were not previously vaccinated. Pneumococcal polysaccharide (PPSV23) vaccine  You may need one or two doses if you smoke cigarettes or if you have certain conditions. Hepatitis A vaccine  You may need this if you have certain conditions or if you travel or work in places where you may be exposed to hepatitis A. Hepatitis B vaccine  You may need this if you have certain conditions or if you travel or work in places where you may be exposed to hepatitis B. Haemophilus influenzae type b (Hib) vaccine  You may need this if you have certain conditions. You may receive vaccines as individual doses or as more than one vaccine together in one shot (combination vaccines). Talk with your health care provider about the risks and benefits of combination vaccines. What tests do I need?  Blood tests  Lipid and cholesterol levels. These may be checked every 5 years starting at age 20.  Hepatitis C test.  Hepatitis B test. Screening  Diabetes screening. This is done by checking your blood sugar (glucose) after you have not eaten for a while (fasting).  Sexually transmitted disease (STD) testing.  BRCA-related cancer screening. This may be done if you have a family history of breast, ovarian, tubal, or peritoneal cancers.  Pelvic exam and Pap test. This may be done every 3 years starting at age 21. Starting at age 30, this may be done every 5 years if you have a Pap test in combination with an HPV test. Talk with your health care provider about your test results, treatment options, and if necessary, the need for more tests.   Follow these instructions at home: Eating and drinking   Eat a diet that includes fresh fruits and vegetables, whole grains, lean protein, and low-fat dairy.  Take vitamin and mineral supplements as recommended by your health care provider.  Do not drink alcohol if: ? Your health care provider tells you not to drink. ? You are  pregnant, may be pregnant, or are planning to become pregnant.  If you drink alcohol: ? Limit how much you have to 0-1 drink a day. ? Be aware of how much alcohol is in your drink. In the [place] one drink equals one 12 oz bottle of beer (355 mL), one 5 oz glass of wine (148 mL), or one 1 oz glass of hard liquor (44 mL). Lifestyle  Take daily care of your teeth and gums.  Stay active. Exercise for at least 30 minutes on 5 or more days each week.  Do not use any products that contain nicotine or tobacco, such as cigarettes, e-cigarettes, and chewing tobacco. If you need help quitting, ask your health care provider.  If you are sexually active, practice safe sex. Use a condom or other form of birth control (contraception) in order to prevent pregnancy and STIs (sexually transmitted infections). If you plan to become pregnant, see your health care provider for a preconception visit. What's next?  Visit your health care provider once a year for a well check visit.  Ask your health care provider how often you should have your eyes and teeth checked.  Stay up to date on all vaccines. This information is not intended to replace advice given to you by your health care provider. Make sure you discuss any questions you have with your health care provider. Document Revised: 11/27/2017 Document Reviewed: 11/27/2017 Elsevier Patient Education  2020 [employer].

## 2019-07-15 ENCOUNTER — Other Ambulatory Visit: Payer: Self-pay

## 2019-07-15 ENCOUNTER — Ambulatory Visit: Payer: Medicaid Other | Admitting: Licensed Clinical Social Worker

## 2019-07-15 DIAGNOSIS — F411 Generalized anxiety disorder: Secondary | ICD-10-CM

## 2019-07-15 LAB — CYTOLOGY - PAP: Diagnosis: NEGATIVE

## 2019-07-15 NOTE — Progress Notes (Signed)
Counselor/Therapist Progress Note  Patient ID: Stephanie Hampton, MRN: [id number],    Date: 07/15/2019  Time Spent: 45 minutes   Treatment Type: Individual Therapy  Reported Symptoms: Anxiety, anxious thought patterns  Mental Status Exam:   Appearance:   Casual     Behavior:  Appropriate  Motor:  Normal  Speech/Language:   Normal Rate  Affect:  Appropriate  Mood:  normal  Thought process:  normal  Thought content:    WNL  Sensory/Perceptual disturbances:    WNL  Orientation:  oriented to person, place, time/date and situation  Attention:  Good  Concentration:  Good  Memory:  WNL  Fund of knowledge:   Good  Insight:    Good  Judgment:   Good  Impulse Control:  Good   Risk Assessment: Danger to Self:  No Self-injurious Behavior: No Danger to Others: No Duty to Warn:no Physical Aggression / Violence:No  Access to Firearms a concern: No  Gang Involvement:No   Subjective: Patient was engaged and cooperative throughout the session using time effectively to discuss treatment plan and mindfulness. Patient voices continued motivation for treatment and understanding of anxiety issues. Patient is likely to benefit from future treatment because she is motivated to decrease anxiety symptoms and improve functioning.     Interventions: Cognitive Behavioral Therapy and Mindfulness Meditation   Checked in with patient and reviewed previous session, including assessment and goal of treatment. Discussed current medication management and encouraged patient to discuss Zoloft with prescriber. Reviewed CBTs. Explored patient's goal of treatment and worked collaboratively to develop CBT treatment plan. Provided Psychoeducation on mindfulness, engaged patient in mindfulness exercise, processed exercise, and contracted with patient to complete daily. Provided support through active listening, validation of feelings, and highlighted patient's strengths.   Diagnosis:   ICD-10-CM   1. Generalized  anxiety disorder  F41.1     Plan: Patient's goal: To feel better. To learn how to manage feelings.                                Treatment target:  Increase realistic balanced thinking -to learn how to replace thinking with thoughts that are more accurate or helpful - Explore patient's thoughts, beliefs, automatic thoughts, assumptions  - Identify unhelpful thinking patterns  - Process distress and allow for emotional release  - Challenging the evidence  - Cognitive reappraisal  - Restructuring, Socratic questioning  - Provide psychoeducation on core beliefs, explore, and assist patient in identifying core beliefs  Treatment Target:   Treatment Target: Increase coping skills  - Introduce Mindfulness  - Teach mindfulness-  focus on awareness of thoughts and feelings without attachment or judgment - Practice mindfulness - Self-care - nutrition, sleep, exercise  - Identify value driven behaviors    Future Appointments  Date Time Provider Department Center  07/29/2019 11:00 AM Kathreen Cosier, LCSW [facility] None    Interpreter used: NA    Kathreen Cosier, LCSW

## 2019-07-16 ENCOUNTER — Other Ambulatory Visit: Payer: Self-pay | Admitting: Certified Nurse Midwife

## 2019-07-16 MED ORDER — SERTRALINE HCL 50 MG PO TABS: 50.0000 mg | ORAL_TABLET | Freq: Every day | ORAL | 0 refills | Status: DC

## 2019-07-16 NOTE — Progress Notes (Signed)
Pt request to change from Celexa to Zoloft due to side effects. Orders placed 50 mg PO q day Zoloft. PT will follow up for medication check in 4 wks.   Doreene Burke, CNM

## 2019-07-29 ENCOUNTER — Ambulatory Visit: Payer: Medicaid Other | Admitting: Licensed Clinical Social Worker

## 2019-08-04 ENCOUNTER — Ambulatory Visit: Payer: Medicaid Other | Admitting: Licensed Clinical Social Worker

## 2019-08-04 DIAGNOSIS — F411 Generalized anxiety disorder: Secondary | ICD-10-CM
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
## 2019-08-07 ENCOUNTER — Other Ambulatory Visit: Payer: Self-pay | Admitting: Certified Nurse Midwife

## 2019-08-18 ENCOUNTER — Ambulatory Visit: Payer: Medicaid Other | Admitting: Licensed Clinical Social Worker

## 2019-08-18 DIAGNOSIS — F411 Generalized anxiety disorder: Secondary | ICD-10-CM

## 2019-08-18 NOTE — Progress Notes (Signed)
Counselor/Therapist Progress Note  Patient ID: Stephanie Hampton, MRN: [id number],    Date: 08/18/2019  Time Spent: 45 minutes   Treatment Type: Individual Therapy  Reported Symptoms: decrease in anxiety symptoms, mild anxiety symptoms  - anxious thoughts  Mental Status Exam:  Appearance:   Casual     Behavior:  Appropriate and Sharing  Motor:  Normal  Speech/Language:   Normal Rate  Affect:  Appropriate  Mood:  normal  Thought process:  normal  Thought content:    WNL  Sensory/Perceptual disturbances:    WNL  Orientation:  oriented to person, place, time/date and situation  Attention:  Good  Concentration:  Good  Memory:  WNL  Fund of knowledge:   Good  Insight:    Good  Judgment:   Good  Impulse Control:  Good   Risk Assessment: Danger to Self:  No Self-injurious Behavior: No Danger to Others: No Duty to Warn:no Physical Aggression / Violence:No  Access to Firearms a concern: No  Gang Involvement:No   Subjective: Patient was engaged and cooperative throughout the session using time effectively to discuss thoughts and feelings. Patient voices continued motivation for treatment and understanding of anxiety issues. Patient is likely to benefit from future treatment because she remains motivated to decrease anxiety, and reports use and benefit of coping skills discussed in sessions.   Interventions: Cognitive Behavioral Therapy  Established psychological safety. Checked in with patient and reviewed previous session - using 5 senses. Taught patient [place] exercise. Explored patient's concerns about family vacation, identifying points of intervention. Encouraged patient to notice thoughts and reframe unhelpful thoughts. Led patient in mindfulness exercise "favorit place", processed exercise and encouraged continued use. Provided support through active listening, validation of feelings, and highlighted patient's strengths.    Diagnosis:   ICD-10-CM   1. Generalized anxiety  disorder  F41.1     Plan: Patient's goal:To feel better. To learn how to manage feelings.  Treatment target:Increase realistic balanced thinking -to learn how to replace thinking with thoughts that are more accurate or helpful  Explore patient's thoughts, beliefs, automatic thoughts, assumptions   Identify unhelpful thinking patterns   Process distress and allow for emotional release   Challenging the evidence   Cognitive reappraisal   Restructuring, Socratic questioning   Provide psychoeducation on core beliefs, explore, and assist patient in identifying core beliefs   Treatment Target:Increase coping skills   Introduce Mindfulness   Teach mindfulness- focus on awareness of thoughts and feelings without attachment or judgment  Practice mindfulness  Self-care - nutrition, sleep, exercise   Identify value driven behaviors  Future Appointments  Date Time Provider Department Center  09/08/2019  1:00 PM Kathreen Cosier, LCSW [facility] None    Interpreter used: na   Kathreen Cosier, LCSW

## 2019-09-04 IMAGING — US US OB COMP LESS 14 WK
1 series · 14 of 28 positions shown · non-contrast
Comparison: None.

CLINICAL DATA: Size is not consistent with dates. Gestational age
by LMP is 8 weeks and 6 days.

EXAM:
OBSTETRIC <14 WK ULTRASOUND
TECHNIQUE: Transabdominal ultrasound was performed for evaluation of the
gestation as well as the maternal uterus and adnexal regions.

[Series 1: us ob comp less 14 wk · 0.18mm/px · 37 acquisitions, 14 frames shown]
[im 2/37]
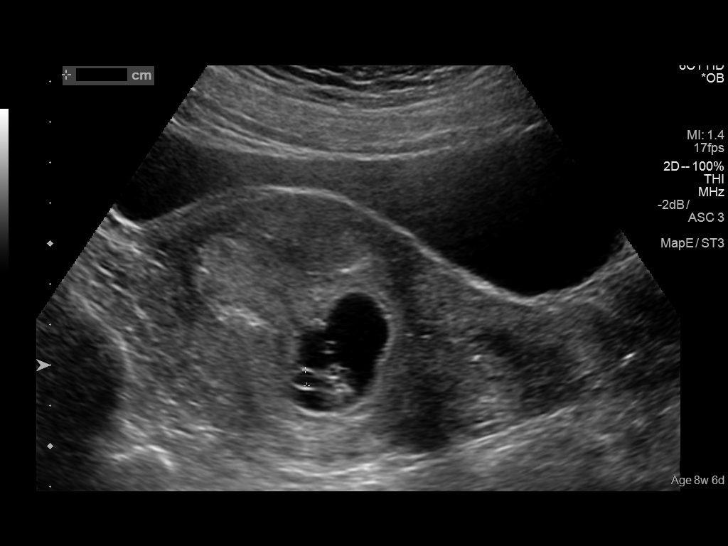
[im 5/37]
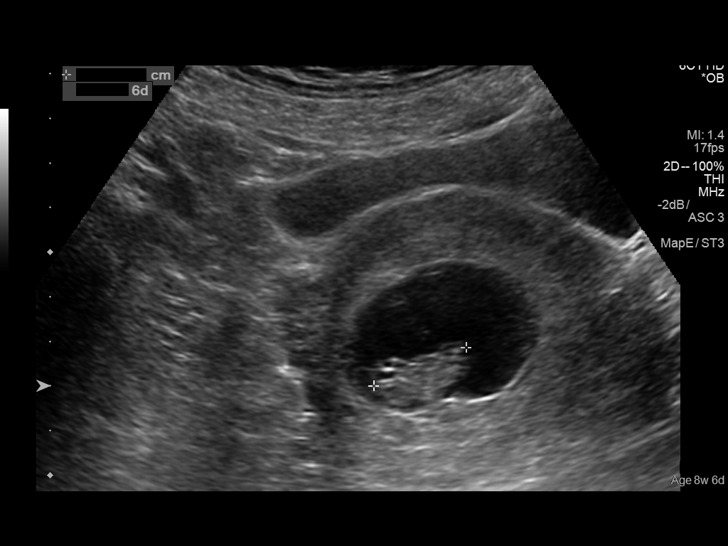
[im 7/37]
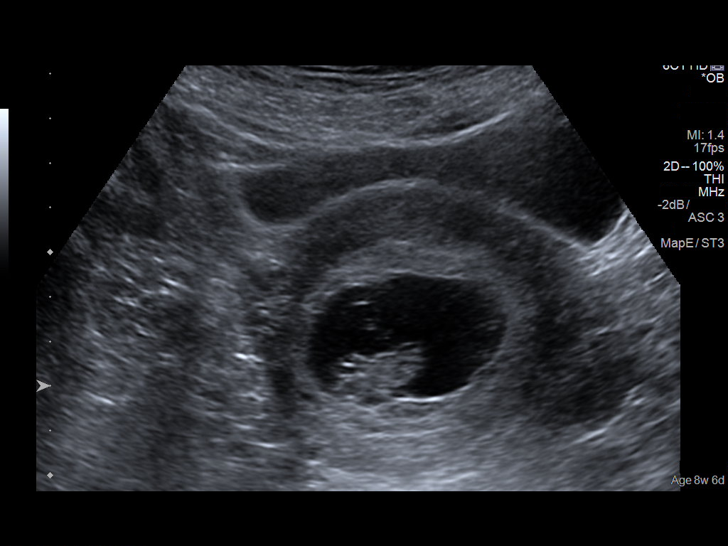
[im 10/37]
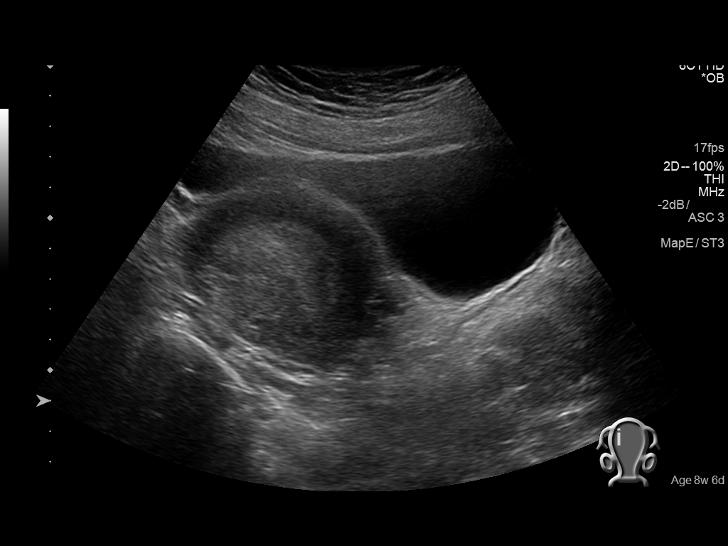
[im 13/37]
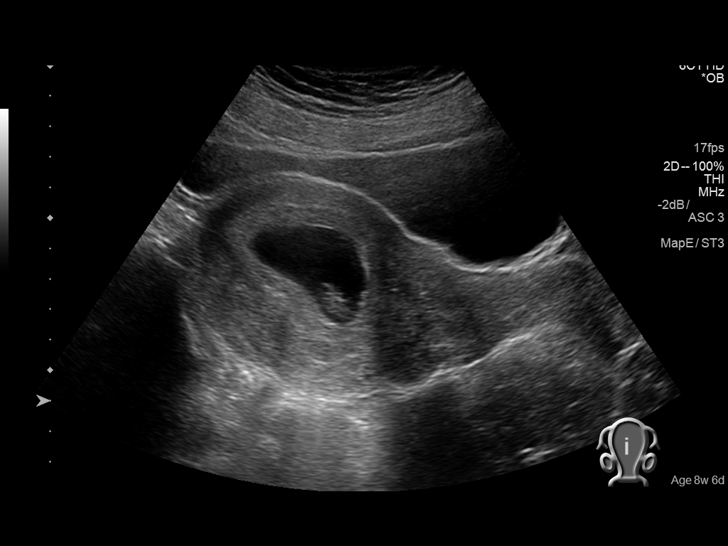
[im 15/37]
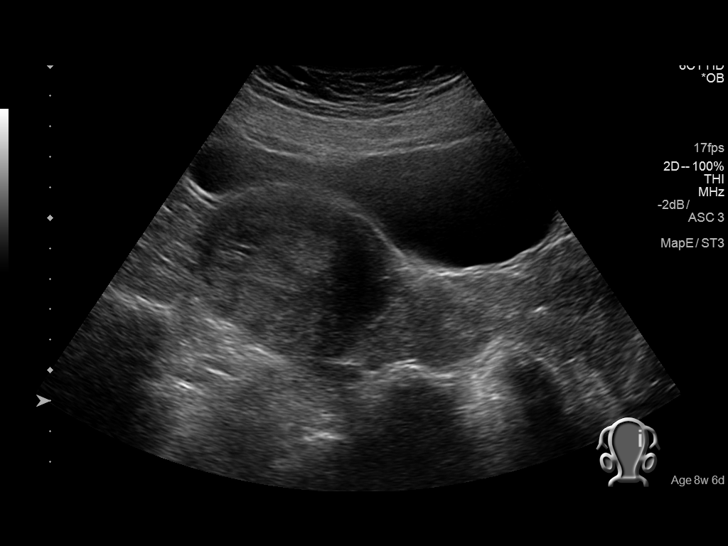
[im 18/37]
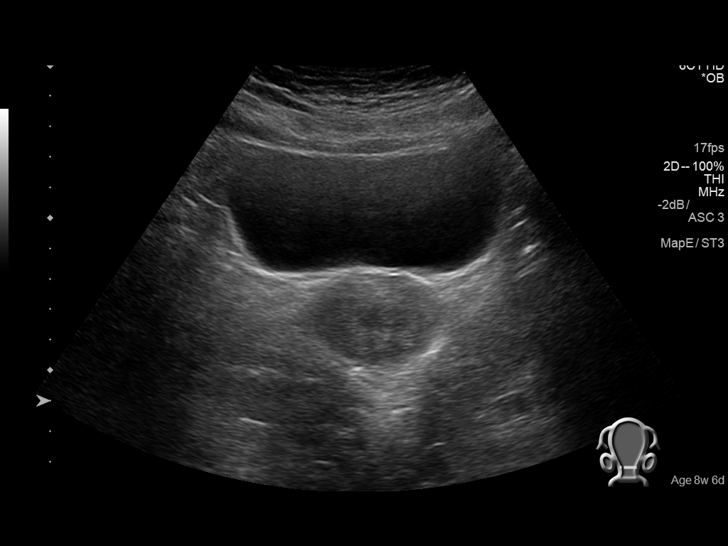
[im 21/37]
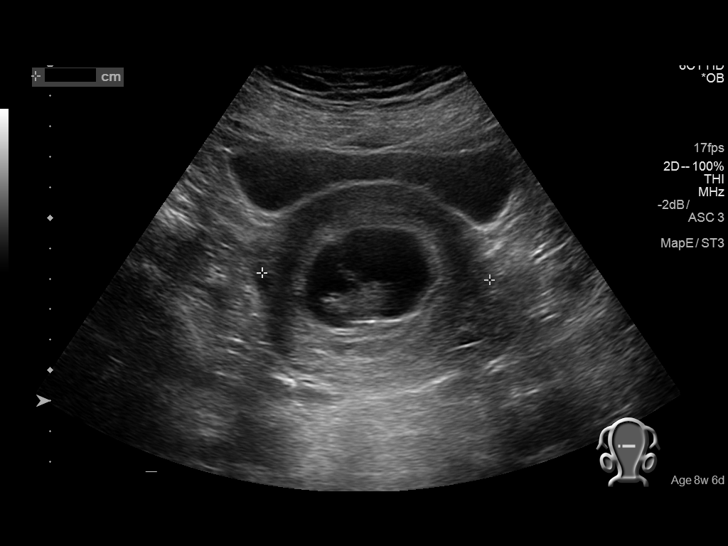
[im 23/37]
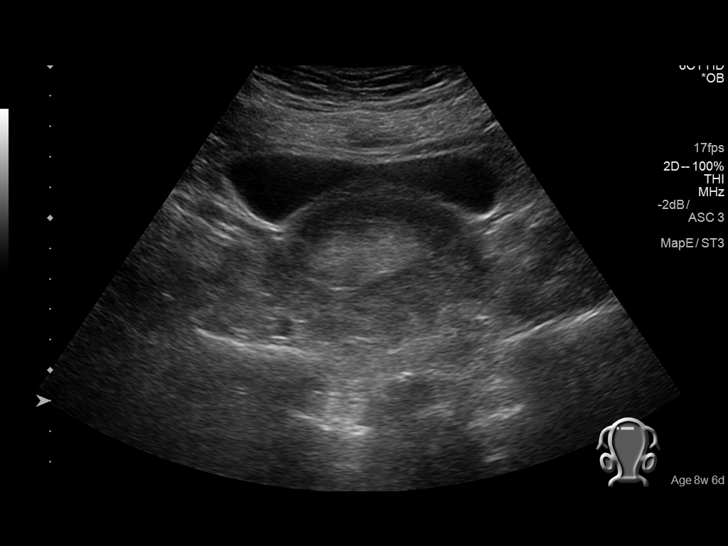
[im 26/37]
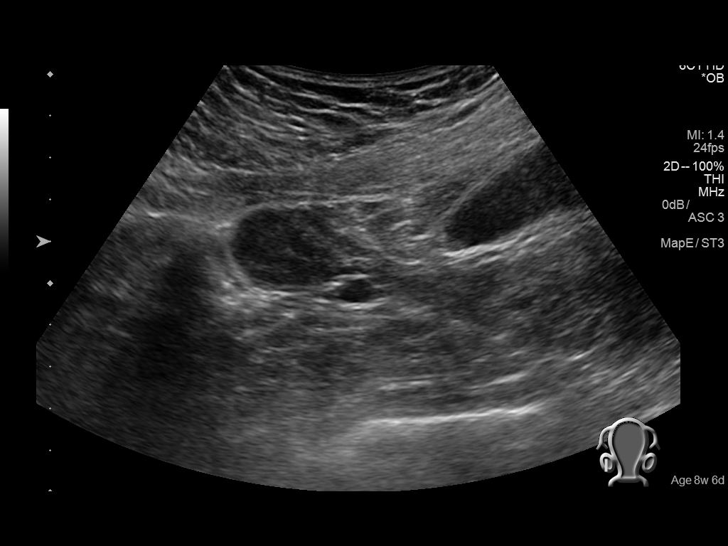
[im 29/37]
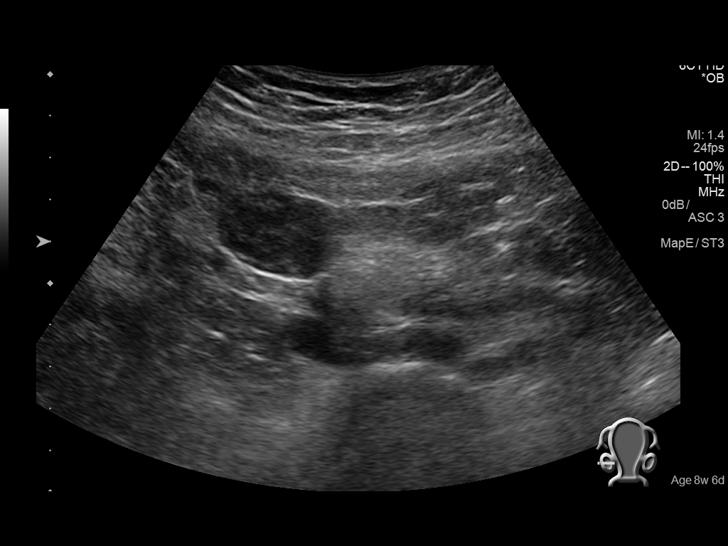
[im 31/37]
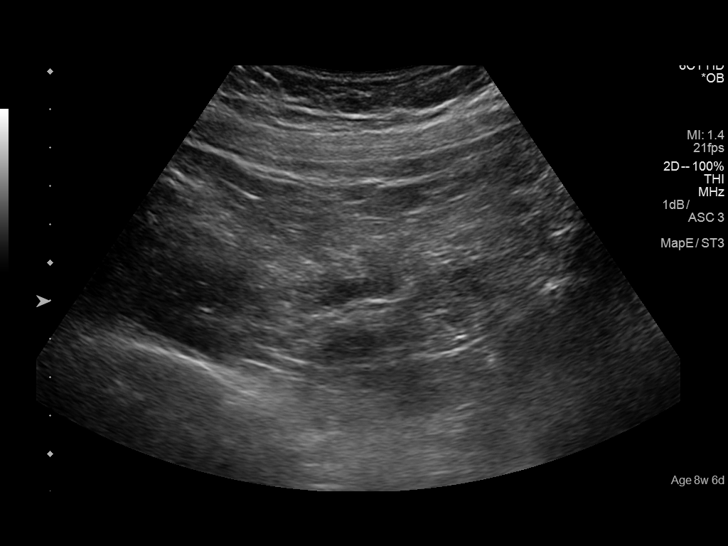
[im 34/37]
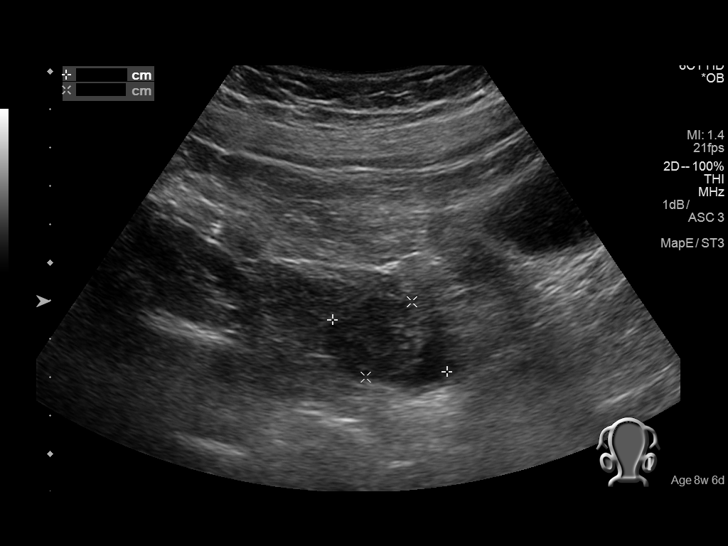
[im 37/37]
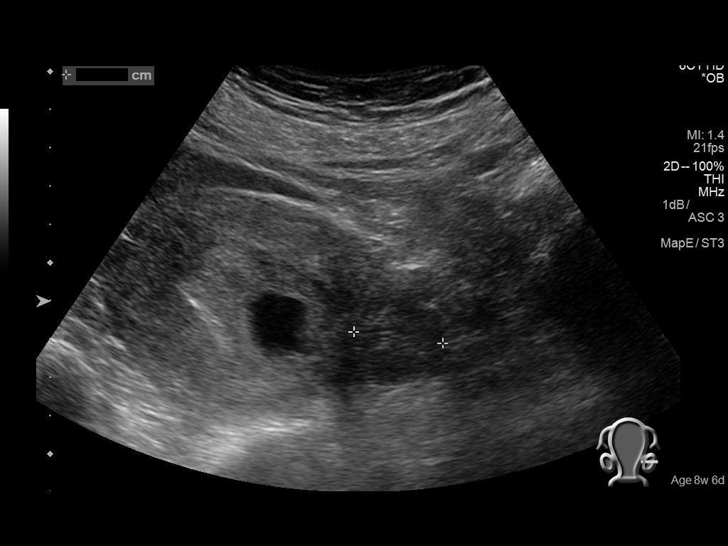

[14 of 28 positions shown; findings below may reference images not displayed]

FINDINGS: Intrauterine gestational sac: Single

Yolk sac:  Visualized.

Embryo:  Visualized.

Cardiac Activity: Visualized.

Heart Rate: 175 bpm

CRL:   22 mm   8 w 6 d                  US EDC: 03/14/2018

Subchorionic hemorrhage:  None visualized.

Maternal uterus/adnexae: Normal appearance of the right ovary
measuring 2.6 x 2.0 x 2.3 cm. Normal appearance of the left ovary
measuring 3.3 x 2.3 x 2.3 cm. No free fluid.
IMPRESSION: Single live intrauterine pregnancy. Gestational age by ultrasound is
8 weeks and 6 days.

## 2019-09-08 ENCOUNTER — Ambulatory Visit: Payer: Medicaid Other | Admitting: Licensed Clinical Social Worker

## 2019-09-15 ENCOUNTER — Ambulatory Visit: Payer: Medicaid Other | Admitting: Licensed Clinical Social Worker

## 2019-09-15 DIAGNOSIS — F411 Generalized anxiety disorder: Secondary | ICD-10-CM
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
## 2019-09-27 ENCOUNTER — Ambulatory Visit: Payer: Medicaid Other | Admitting: Licensed Clinical Social Worker

## 2019-09-27 DIAGNOSIS — F411 Generalized anxiety disorder: Secondary | ICD-10-CM

## 2019-09-27 NOTE — Progress Notes (Signed)
Counselor/Therapist Progress Note  Patient ID: Stephanie Hampton, MRN: [id number],    Date: 09/27/2019  Time Spent: 48 minutes   Treatment Type: Individual Therapy  Reported Symptoms: Anhedonia and low mood, sleep disturbance; anxiety, anxious thoughts   Mental Status Exam:  Appearance:   Casual     Behavior:  Appropriate and Sharing  Motor:  Normal  Speech/Language:   Normal Rate  Affect:  Congruent  Mood:  dysthymic  Thought process:  normal  Thought content:    WNL  Sensory/Perceptual disturbances:    WNL  Orientation:  oriented to person, place, time/date, situation and day of week  Attention:  Good  Concentration:  Good  Memory:  WNL  Fund of knowledge:   Good  Insight:    Good  Judgment:   Good  Impulse Control:  Good   Risk Assessment: Danger to Self:  No Self-injurious Behavior: No Danger to Others: No Duty to Warn:no Physical Aggression / Violence:No  Access to Firearms a concern: No  Gang Involvement:No   Subjective: Patient was engaged and cooperative throughout the session using time effectively to discuss thoughts and feelings. Patient voices continued motivation for treatment and understanding of anxiety and depression issues. Patient is likely to benefit from future treatment because she remains motivated to decrease anxiety symptoms and depressive symptoms and reports benefit of regular sessions in addressing these symptoms.   Interventions: Cognitive Behavioral Therapy  Established psychological safety. Checked in with patient, set session agenda. Assessed patient's depressive symptoms. Discussed sleep hygiene. Encouraged patient to follow up with prescriber regarding possibly increasing dosage of Zoloft.  Explored patient's perceptions of unresolved martial challenges, highlighting unhelpful thoughts and using socratic questioning to increase flexibility in patient's thoughts. Encouraged patient to journal when she begins to ruminate about marital challenges  related to unresolved grief from previous infidelity. Encouraged patient to engage in family/social connections and activities outside of the home. Provided support through active listening, validation of feelings, and highlighted patient's strengths.   Diagnosis:   ICD-10-CM   1. Generalized anxiety disorder  F41.1     Plan: Continue processing challenges within marriage related to previous infidelity    Patient's goal:To feel better. To learn how to manage feelings.  Treatment target:Increase realistic balanced thinking -to learn how to replace thinking with thoughts that are more accurate or helpful  Explore patient's thoughts, beliefs, automatic thoughts, assumptions   Identify unhelpful thinking patterns   Process distress and allow for emotional release   Challenging the evidence   Cognitive reappraisal   Restructuring, Socratic questioning   Provide psychoeducation on core beliefs, explore, and assist patient in identifying core beliefs   Treatment Target:Increase coping skills   Teach mindfulness- focus on awareness of thoughts and feelings without attachment or judgment  Practice mindfulness  Self-care - nutrition, sleep, exercise   Identify value driven behaviors  Future Appointments  Date Time Provider Department Center  10/06/2019  1:00 PM Kathreen Cosier, LCSW [facility] None   Interpreter used: NA  Kathreen Cosier, LCSW

## 2019-10-06 ENCOUNTER — Ambulatory Visit: Payer: Medicaid Other | Admitting: Licensed Clinical Social Worker

## 2019-10-06 DIAGNOSIS — F411 Generalized anxiety disorder: Secondary | ICD-10-CM

## 2019-10-06 NOTE — Progress Notes (Signed)
Counselor/Therapist Progress Note  Patient ID: Stephanie Hampton, MRN: [id number],    Date: 10/06/2019  Time Spent: 57 minutes   Treatment Type: Individual Therapy  Reported Symptoms: Obsessive thinking and anxiety, worries, difficulties controling worries  Mental Status Exam:  Appearance:   Casual     Behavior:  Appropriate and Sharing  Motor:  Normal  Speech/Language:   Normal Rate  Affect:  Appropriate  Mood:  normal  Thought process:  normal  Thought content:    WNL  Sensory/Perceptual disturbances:    WNL  Orientation:  oriented to person, place, time/date, situation and day of week  Attention:  Good  Concentration:  Good  Memory:  WNL  Fund of knowledge:   Good  Insight:    Good  Judgment:   Good  Impulse Control:  Good   Risk Assessment: Danger to Self:  No Self-injurious Behavior: No Danger to Others: No Duty to Warn:no Physical Aggression / Violence:No  Access to Firearms a concern: No  Gang Involvement:No   Subjective: Patient was engaged and cooperative throughout the session using time effectively to discuss thoughts and feelings. Patient voices continued motivation for treatment and understanding of anxiety and depression issues. Patient is likely to benefit from future treatment because she remains motivated to decrease anxiety/depressive symptoms and reports benefit of regular sessions in addressing these symptoms.    Interventions: Cognitive Behavioral Therapy  Established psychological safety. Checked in with patient regarding her week. Reviewed previous session. Engaged patient in reviewing homework task - providing space for patient to process distress related to previous infidelity. Explored patient's perceptions, identifying and highlighting stuck points and used challenging thoughts and reframing strategies to address unhelpful thoughts. Discussed radical acceptance, teaching patient to notice the distress between what is and what she desires it to be.  Provided support through active listening, validation of feelings, and highlighted patient's strengths.   Diagnosis:   ICD-10-CM   1. Generalized anxiety disorder  F41.1     Plan: Provide psychoeducation on Unhelpful thought patterns. Continue processing challenges within marriage related to previous infidelity   Patient's goal:To feel better. To learn how to manage feelings.  Treatment target:Increase realistic balanced thinking -to learn how to replace thinking with thoughts that are more accurate or helpful  Explore patient's thoughts, beliefs, automatic thoughts, assumptions   Identify unhelpful thinking patterns   Process distress and allow for emotional release   Process traumatic event, identify stuck points, and restructure these thoughts   Challenging the evidence   Cognitive reappraisal   Restructuring, Socratic questioning   Provide psychoeducation on core beliefs, explore, and assist patient in identifying core beliefs   Treatment Target:Increase coping skills   Teach mindfulness- focus on awareness of thoughts and feelings without attachment or judgment  Practice mindfulness  Self-care - nutrition, sleep, exercise   Identify value driven behaviors  Future Appointments  Date Time Provider Department Center  10/20/2019  2:00 PM Kathreen Cosier, LCSW [facility] None   Interpreter used: NA  Kathreen Cosier, LCSW

## 2019-10-20 ENCOUNTER — Ambulatory Visit: Payer: Medicaid Other | Admitting: Licensed Clinical Social Worker

## 2019-11-08 IMAGING — US US ABDOMEN COMPLETE
1 series · 14 of 25 positions shown · non-contrast
Comparison: Abdominal and pelvic CT scan July 19, 2013

CLINICAL DATA: Upper abdominal pain in a 4 week pregnant patient.
History of endometriosis.

EXAM:
ABDOMEN ULTRASOUND COMPLETE

[Series 1: us abdomen complete · 14 of 74 slices shown]
[im 1/74]
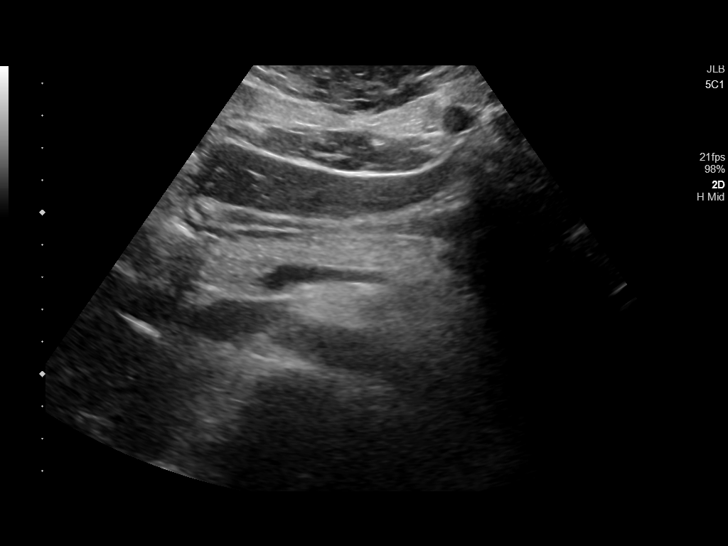
[im 7/74]
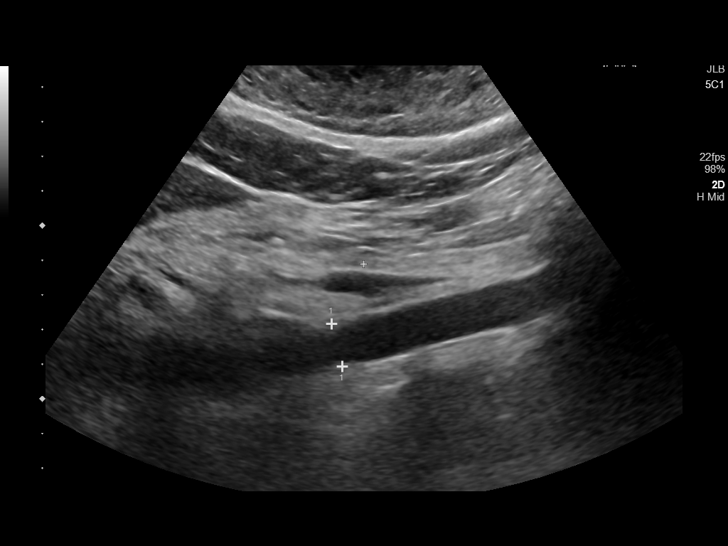
[im 13/74]
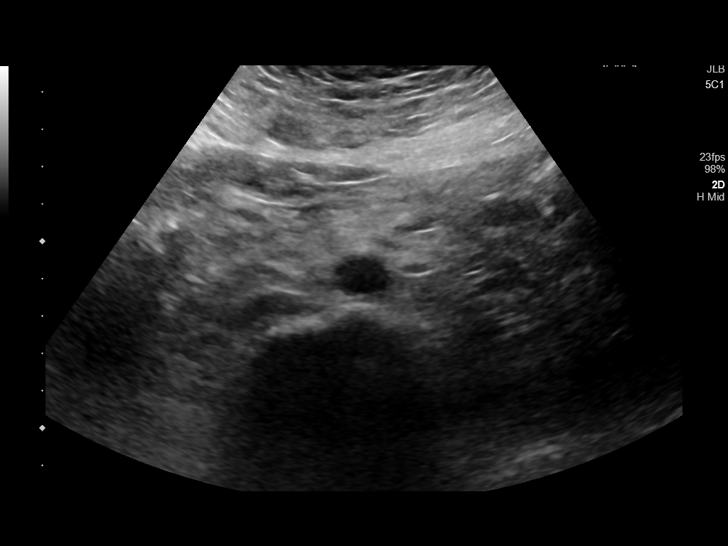
[im 19/74]
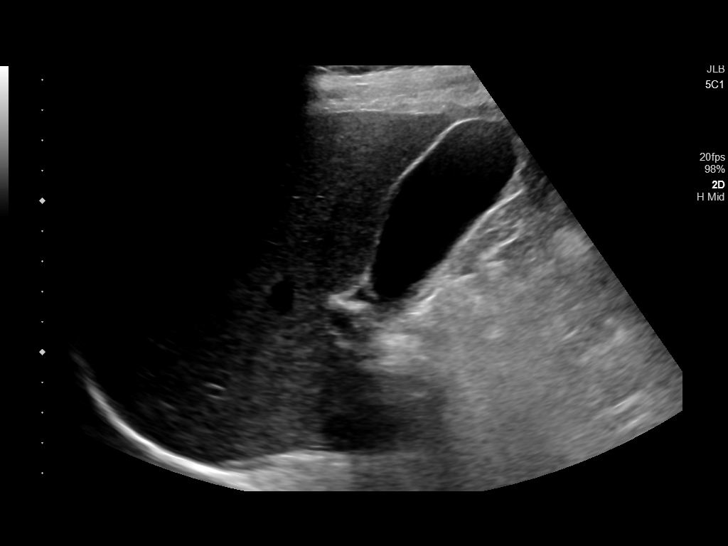
[im 25/74]
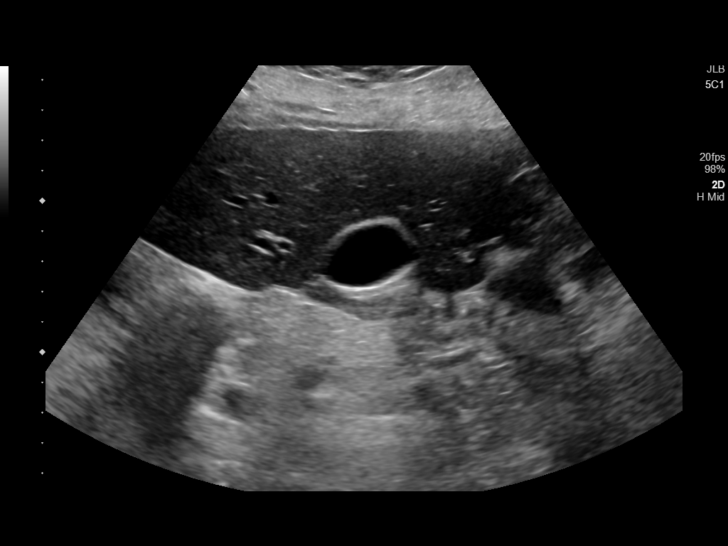
[im 28/74]
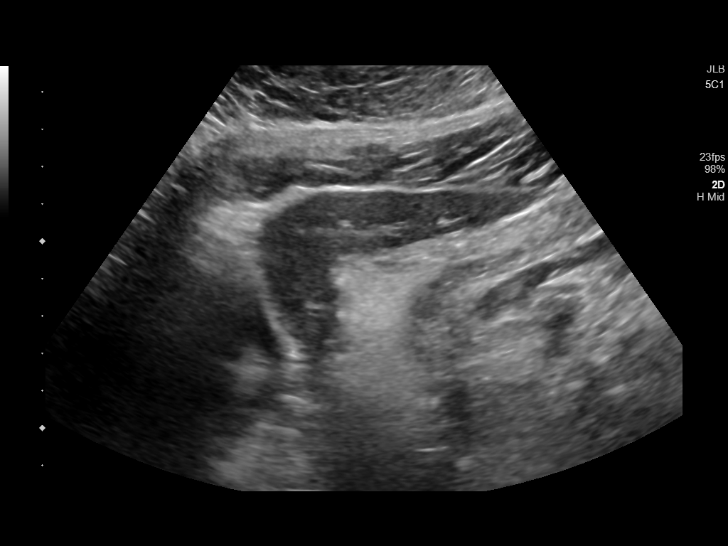
[im 34/74]
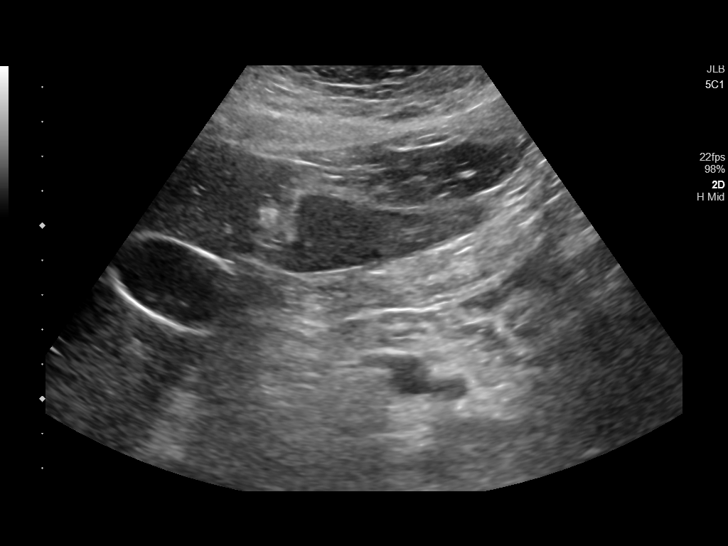
[im 40/74]
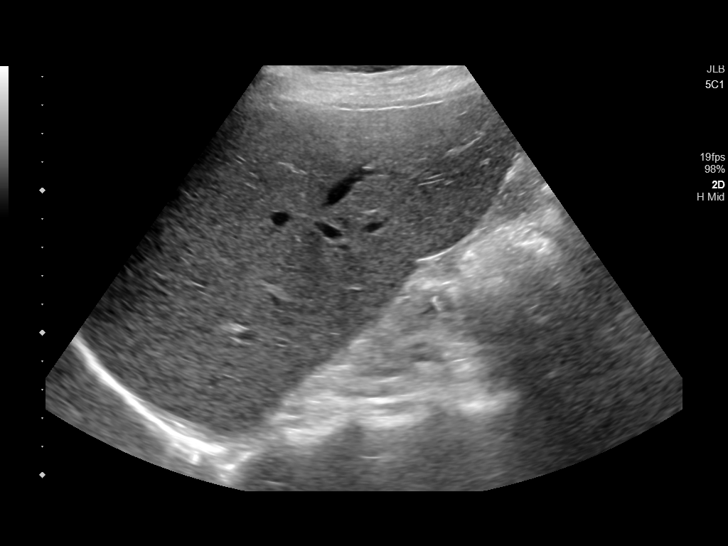
[im 46/74]
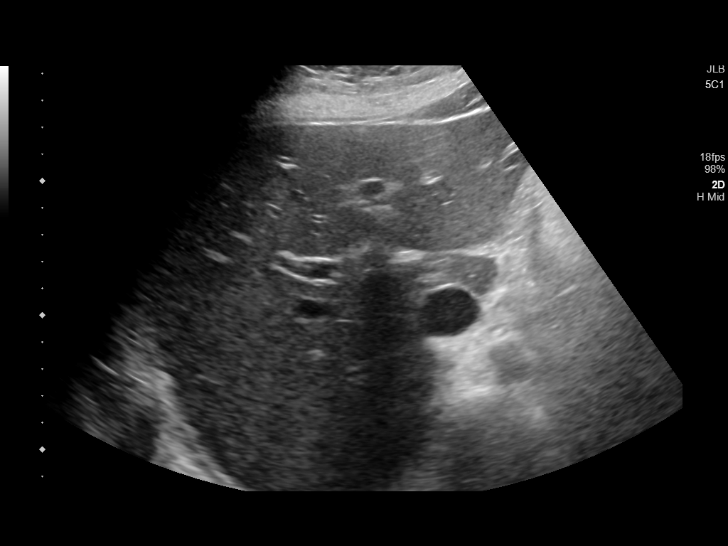
[im 49/74]
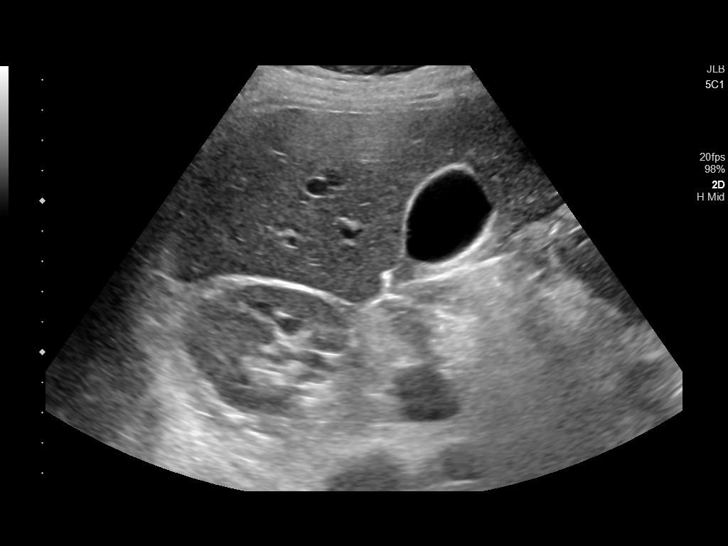
[im 55/74]
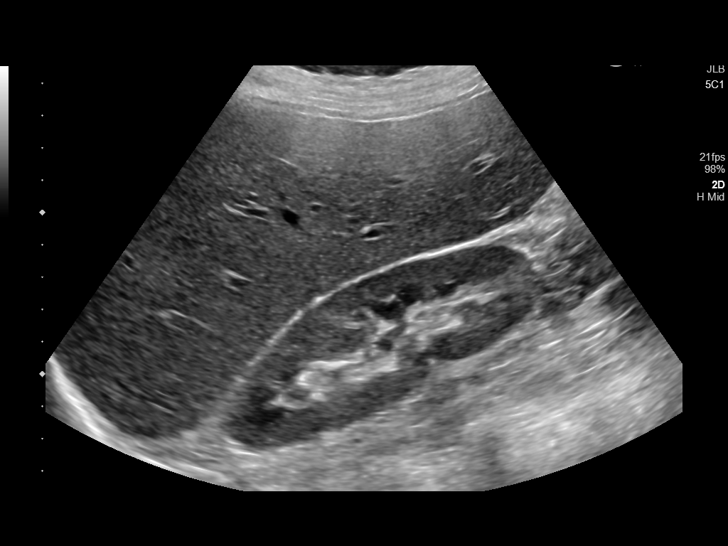
[im 61/74]
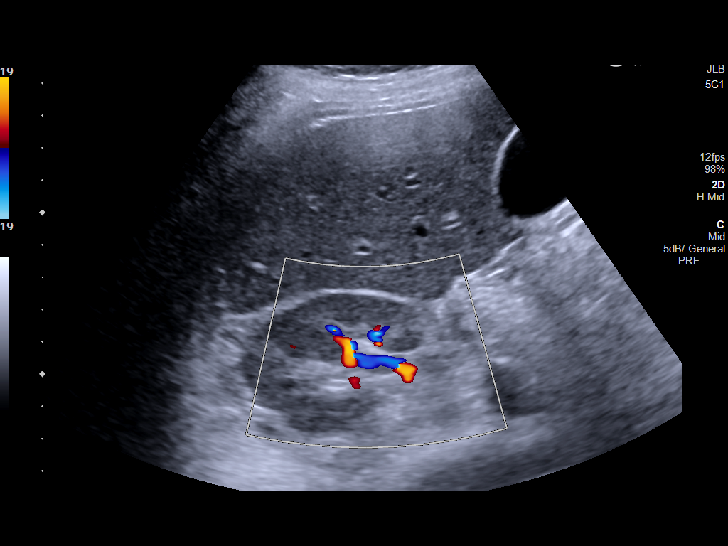
[im 67/74]
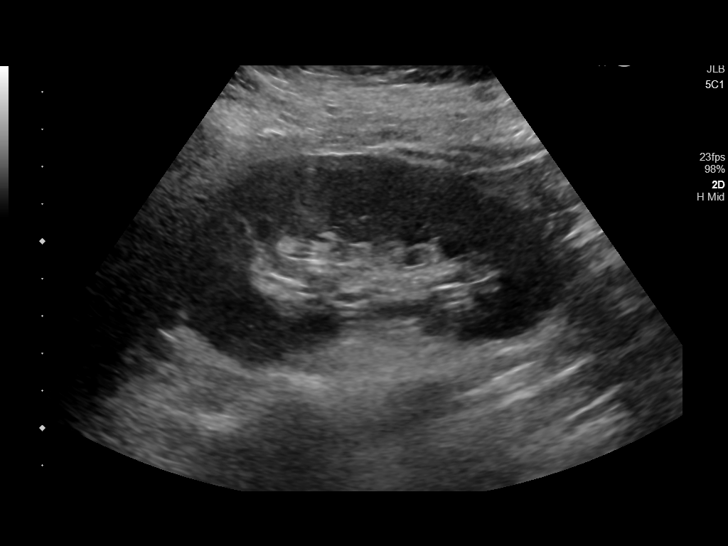
[im 74/74]
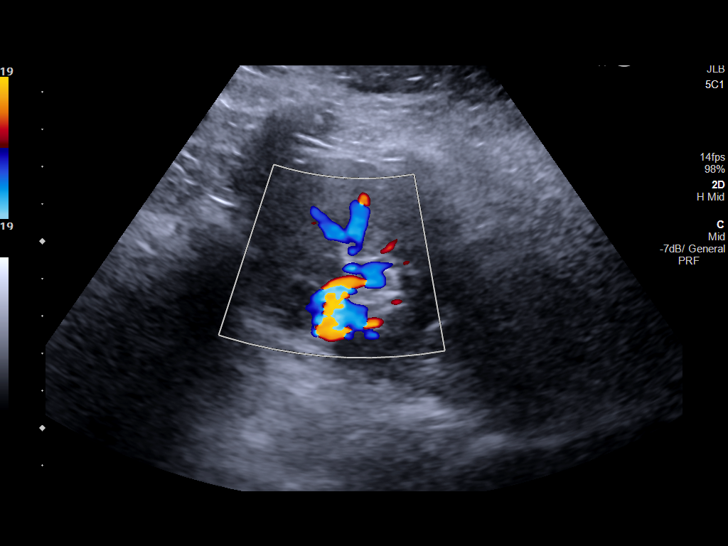

[14 of 25 positions shown; findings below may reference images not displayed]

FINDINGS: Gallbladder: No gallstones or wall thickening visualized. No
sonographic Murphy sign noted by sonographer.

Common bile duct: Diameter: 1.7 mm

Liver: No focal lesion identified. Within normal limits in
parenchymal echogenicity. Portal vein is patent on color Doppler
imaging with normal direction of blood flow towards the liver.

IVC: No abnormality visualized.

Pancreas: Visualized portion unremarkable.

Spleen: Size and appearance within normal limits.

Right Kidney: Length: 10.8 cm. Echogenicity within normal limits. No
mass or hydronephrosis visualized.

Left Kidney: Length: 10.2 cm. Echogenicity within normal limits. No
mass or hydronephrosis visualized.

Abdominal aorta: No aneurysm visualized.

Other findings: There is no ascites.
IMPRESSION: Normal abdominal ultrasound. No gallstones, evidence of
cholecystitis, nor other acute intra-abdominal abnormality.

## 2020-01-23 ENCOUNTER — Other Ambulatory Visit: Payer: Self-pay

## 2020-01-23 ENCOUNTER — Emergency Department
Admission: EM | Admit: 2020-01-23 | Discharge: 2020-01-24 | Disposition: A | Discharge status: Home / Self Care | Payer: BC Managed Care – PPO | Attending: Emergency Medicine | Admitting: Emergency Medicine

## 2020-01-23 DIAGNOSIS — Z9049 Acquired absence of other specified parts of digestive tract: Secondary | ICD-10-CM | POA: Insufficient documentation

## 2020-01-23 DIAGNOSIS — R102 Pelvic and perineal pain: Secondary | ICD-10-CM | POA: Diagnosis not present

## 2020-01-23 DIAGNOSIS — R188 Other ascites: Secondary | ICD-10-CM | POA: Diagnosis not present

## 2020-01-23 DIAGNOSIS — R1033 Periumbilical pain: Secondary | ICD-10-CM

## 2020-01-23 LAB — COMPREHENSIVE METABOLIC PANEL
ALT: 16 U/L (ref 0–44)
AST: 14 U/L — ABNORMAL LOW (ref 15–41)
Albumin: 4.6 g/dL (ref 3.5–5.0)
Alkaline Phosphatase: 76 U/L (ref 38–126)
Anion gap: 8 (ref 5–15)
BUN: 12 mg/dL (ref 6–20)
CO2: 27 mmol/L (ref 22–32)
Calcium: 9.2 mg/dL (ref 8.9–10.3)
Chloride: 104 mmol/L (ref 98–111)
Creatinine, Ser: 0.69 mg/dL (ref 0.44–1.00)
GFR, Estimated: >60 mL/min (ref >60)
Glucose, Bld: 106 mg/dL — ABNORMAL HIGH (ref 70–99)
Potassium: 4.3 mmol/L (ref 3.5–5.1)
Sodium: 139 mmol/L (ref 135–145)
Total Bilirubin: 0.7 mg/dL (ref 0.3–1.2)
Total Protein: 7.9 g/dL (ref 6.5–8.1)

## 2020-01-23 LAB — POCT PREGNANCY, URINE: Preg Test, Ur: NEGATIVE

## 2020-01-23 LAB — URINALYSIS, COMPLETE (UACMP) WITH MICROSCOPIC
Bacteria, UA: NONE SEEN
Bilirubin Urine: NEGATIVE
Glucose, UA: NEGATIVE mg/dL
Ketones, ur: NEGATIVE mg/dL
Leukocytes,Ua: NEGATIVE
Nitrite: NEGATIVE
Protein, ur: NEGATIVE mg/dL
Specific Gravity, Urine: 1.008 (ref 1.005–1.030)
WBC, UA: NONE SEEN WBC/hpf (ref 0–5)
pH: 6 (ref 5.0–8.0)

## 2020-01-23 LAB — CBC
HCT: 40.9 % (ref 36.0–46.0)
Hemoglobin: 13.5 g/dL (ref 12.0–15.0)
MCH: 29.3 pg (ref 26.0–34.0)
MCHC: 33 g/dL (ref 30.0–36.0)
MCV: 88.7 fL (ref 80.0–100.0)
Platelets: 295 10*3/uL (ref 150–400)
RBC: 4.61 MIL/uL (ref 3.87–5.11)
RDW: 13.2 % (ref 11.5–15.5)
WBC: 12.9 10*3/uL — ABNORMAL HIGH (ref 4.0–10.5)
nRBC: 0 % (ref 0.0–0.2)

## 2020-01-23 LAB — LIPASE, BLOOD: Lipase: 39 U/L (ref 11–51)

## 2020-01-23 MED ORDER — MORPHINE SULFATE (PF) 4 MG/ML IV SOLN
4.0000 mg | Freq: Once | INTRAVENOUS | Status: AC
  Administered 2020-01-23: 4 mg via INTRAVENOUS
  Filled 2020-01-23: qty 1

## 2020-01-23 MED ORDER — IOHEXOL 9 MG/ML PO SOLN
500.0000 mL | Freq: Two times a day (BID) | ORAL | Status: DC | PRN
  Administered 2020-01-23 (×2): 500 mL via ORAL

## 2020-01-23 MED ORDER — ONDANSETRON HCL 4 MG/2ML IJ SOLN
4.0000 mg | Freq: Once | INTRAMUSCULAR | Status: AC
  Administered 2020-01-23: 4 mg via INTRAVENOUS
  Filled 2020-01-23: qty 2

## 2020-01-23 MED ORDER — SODIUM CHLORIDE 0.9 % IV BOLUS
1000.0000 mL | Freq: Once | INTRAVENOUS | Status: AC
  Administered 2020-01-23: 1000 mL via INTRAVENOUS

## 2020-01-23 NOTE — ED Notes (Signed)
PT endorsing RLQ pain radiating near umbilicus. PT endorsing pain started at 745 pm post dinner. PT rating pain 8/10 currently.

## 2020-01-23 NOTE — ED Triage Notes (Addendum)
PT to ED via POV from home c/o abd pain starting 45 min ago. PT states pain is in RLQ and at the umbilicus. No NVD, fever or urinary sx. PT still has appendix.

## 2020-01-23 NOTE — ED Provider Notes (Signed)
[facility] Emergency Department Provider Note   ____________________________________________   First MD Initiated Contact with Patient 01/23/20 2314     (approximate)  I have reviewed the triage vital signs and the nursing notes.   HISTORY  Chief Complaint Abdominal Pain    HPI Stephanie Hampton is a 24 y.o. female who presents to the ED from home with a chief complaint of abdominal pain.  Patient complains of periumbilical and lower abdominal pain, right> left starting approximately 45 minutes prior to arrival after eating a dinner of chicken that he does.  Symptoms not associated with fever, nausea/vomiting or diarrhea.  Patient reports some pressure when she urinates.  Denies cough, chest pain, shortness of breath.  Denies recent travel or trauma.  Surgery of endometriosis and ovarian cyst but states this pain feels sharper and unlike her prior pain.     Past Medical History:  Diagnosis Date  . 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 Depression   . Endometriosis   . Ovarian cyst     Patient Active Problem List   Diagnosis Date Noted  . Gallstones 01/12/2018  . Family history of cardiac disorder 01/08/2018  . RUQ pain 01/08/2018  . 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 Syncope 01/06/2018  . BMI 33.0-33.9,adult 07/09/2017  . Hx of migraine headaches 07/09/2017  . Endometriosis determined by laparoscopy 01/30/2016  . Pelvic pain 01/17/2016    Past Surgical History:  Procedure Laterality Date  . CHOLECYSTECTOMY    . LAPAROSCOPIC OVARIAN CYSTECTOMY Left 01/22/2016   Procedure: LAPAROSCOPIC LEFT OVARIAN CYSTECTOMY-POSSIBLE BIOPSIES;  Surgeon: Herold Harms, MD;  Location: [facility] ORS;  Service: Gynecology;  Laterality: Left;  excision/fulgeration of endometriosis  . NO PAST SURGERIES      Prior to Admission medications   Medication Sig Start Date End Date Taking? Authorizing Provider  Albuterol  Sulfate 108 (90 Base) MCG/ACT AEPB Inhale into the lungs.    [provider]  docusate sodium (COLACE) 100 MG capsule Take 1 capsule (100 mg total) by mouth daily. 11/02/17   Hildred Laser, MD  etodolac (LODINE) 500 MG tablet Take 500 mg by mouth 2 (two) times daily. 04/27/19   [provider]  levonorgestrel (MIRENA) 20 MCG/24HR IUD 1 each by Intrauterine route once.    [provider]  ondansetron (ZOFRAN ODT) 4 MG disintegrating tablet Take 1 tablet (4 mg total) by mouth every 8 (eight) hours as needed for nausea or vomiting. 01/24/20   Irean Hong, MD  oxyCODONE-acetaminophen (PERCOCET/ROXICET) 5-325 MG tablet Take 1 tablet by mouth every 4 (four) hours as needed for severe pain. 01/24/20   Irean Hong, MD  Prenatal Vit-Fe Fumarate-FA (PRENATAL PO) Take 1 tablet by mouth every evening.    [provider]  promethazine (PHENERGAN) 25 MG tablet Take 1 tablet (25 mg total) by mouth every 6 (six) hours as needed for nausea or vomiting. 08/28/17   Hildred Laser, MD  ranitidine (ZANTAC) 150 MG capsule Take 1 capsule (150 mg total) by mouth 2 (two) times daily. 01/05/18   Sharman Cheek, MD  sertraline (ZOLOFT) 50 MG tablet TAKE 1 TABLET BY MOUTH EVERY DAY 08/09/19   Doreene Burke, CNM  sucralfate (CARAFATE) 1 g tablet Take 1 tablet (1 g total) by mouth 4 (four) times daily. 01/05/18   Sharman Cheek, MD  traMADol (ULTRAM) 50 MG tablet Take 50 mg by mouth 3 (three) times daily as needed. 04/27/19  [provider]    Allergies Augmentin [amoxicillin-pot clavulanate] and Metoclopramide  Family History  Problem Relation Age of Onset  . Heart disease Father   . Depression Mother   . Cancer Neg Hx   . Ovarian cancer Neg Hx   . Colon cancer Neg Hx   . Diabetes Neg Hx     Social History Social History   Tobacco Use  . Smoking status: Never Smoker  . Smokeless tobacco: Never Used  Vaping Use  . Vaping Use: Every day  Substance Use Topics  .  Alcohol use: No    Alcohol/week: 0.0 standard drinks  . Drug use: No    Review of Systems  Constitutional: No fever/chills Eyes: No visual changes. ENT: No sore throat. Cardiovascular: Denies chest pain. Respiratory: Denies shortness of breath. Gastrointestinal: Positive for abdominal pain.  No nausea, no vomiting.  No diarrhea.  No constipation. Genitourinary: Negative for dysuria. Musculoskeletal: Negative for back pain. Skin: Negative for rash. Neurological: Negative for headaches, focal weakness or numbness.   ____________________________________________   PHYSICAL EXAM:  VITAL SIGNS: ED Triage Vitals  Enc Vitals Group     BP 01/23/20 2050 124/65     Pulse Rate 01/23/20 2050 78     Resp 01/23/20 2050 18     Temp 01/23/20 2050 98.9 F (37.2 C)     Temp Source 01/23/20 2050 Oral     SpO2 01/23/20 2050 100 %     Weight 01/23/20 2051 210 lb (95.3 kg)     Height 01/23/20 2051 5\' 7"  (1.702 m)     Head Circumference --      Peak Flow --      Pain Score 01/23/20 2051 7     Pain Loc --      Pain Edu? --      Excl. in GC? --     Constitutional: Alert and oriented. Well appearing and in mild acute distress. Eyes: Conjunctivae are normal. PERRL. EOMI. Head: Atraumatic. Nose: No congestion/rhinnorhea. Mouth/Throat: Mucous membranes are moist.   Neck: No stridor.   Cardiovascular: Normal rate, regular rhythm. Grossly normal heart sounds.  Good peripheral circulation.  Wearing loop recorder, on since March 2021 for EP evaluation of syncope. Respiratory: Normal respiratory effort.  No retractions. Lungs CTAB. Gastrointestinal: Soft and mildly tender to palpation at umbilicus and lower abdomen, right> left without rebound or guarding.. No distention. No abdominal bruits. No CVA tenderness. Musculoskeletal: No lower extremity tenderness nor edema.  No joint effusions. Neurologic:  Normal speech and language. No gross focal neurologic deficits are appreciated. No gait  instability. Skin:  Skin is warm, dry and intact. No rash noted. Psychiatric: Mood and affect are normal. Speech and behavior are normal.  ____________________________________________   LABS (all labs ordered are listed, but only abnormal results are displayed)  Labs Reviewed  COMPREHENSIVE METABOLIC PANEL - Abnormal; Notable for the following components:      Result Value   Glucose, Bld 106 (*)    AST 14 (*)    All other components within normal limits  CBC - Abnormal; Notable for the following components:   WBC 12.9 (*)    All other components within normal limits  URINALYSIS, COMPLETE (UACMP) WITH MICROSCOPIC - Abnormal; Notable for the following components:   Color, Urine STRAW (*)    APPearance CLEAR (*)    Hgb urine dipstick SMALL (*)    All other components within normal limits  LIPASE, BLOOD  POC URINE PREG, ED  POCT  PREGNANCY, URINE   ____________________________________________  EKG  None ____________________________________________  RADIOLOGY I, Jaleena Viviani J, personally viewed and evaluated these images (plain radiographs) as part of my medical decision making, as well as reviewing the written report by the radiologist.  ED MD interpretation: Normal appendix, small amount of free fluid in the pelvis likely secondary to ruptured ovarian cyst  Official radiology report(s): CT Abdomen Pelvis W Contrast  Result Date: 01/24/2020 CLINICAL DATA:  Right lower quadrant pain. EXAM: CT ABDOMEN AND PELVIS WITH CONTRAST TECHNIQUE: Multidetector CT imaging of the abdomen and pelvis was performed using the standard protocol following bolus administration of intravenous contrast. CONTRAST:  OMNIPAQUE IOHEXOL 300 MG/ML  SOLN COMPARISON:  Abdominal ultrasound 01/12/2018.  CT 07/19/2013 FINDINGS: Lower chest: Lung bases are clear. No pleural fluid or focal airspace disease. Hepatobiliary: No focal liver abnormality is seen. Status post cholecystectomy. No biliary dilatation.  Pancreas: No ductal dilatation or inflammation. Spleen: Normal in size without focal abnormality. Adrenals/Urinary Tract: Normal adrenal glands. No hydronephrosis. Homogeneous renal enhancement. There is mild prominence of the right ureter but no evidence of ureteral calculus or inflammation. No evidence of ureteral obstruction. No bladder wall thickening or focal bladder abnormality. Stomach/Bowel: The appendix is well visualized and normal, coronal series 5, image 34 and axial series 2, image 71. There is small amount of free fluid adjacent to the appendix but no evidence of periappendiceal inflammation. Stomach is unremarkable. Administered enteric contrast reaches the mid distal small bowel. Slight fecalization of distal small bowel contents. No terminal ileal inflammation or wall thickening. Moderate stool in the ascending and transverse colon. Small volume of stool in the descending colon. Sigmoid colon is slightly tortuous. There is no colonic wall thickening or inflammatory change. Vascular/Lymphatic: Normal caliber abdominal aorta. The portal vein is patent. No abdominopelvic adenopathy. Reproductive: Retroverted uterus with IUD in place. Small amount of free fluid in the dependent pelvis, as well as free fluid tracking into both adnexa. Free fluid in the right tracks adjacent to the cecum. The ovaries appear symmetric without cyst or adnexal mass. Other: Free fluid in the pelvis tracking in both adnexa in the right greater than left pericolic gutter. There is no free air. No evidence of abscess. Tiny fat containing umbilical hernia. Musculoskeletal: There are no acute or suspicious osseous abnormalities. IMPRESSION: 1. Normal appendix. 2. Small amount of free fluid in the pelvis tracking in both adnexa and the right greater than left pericolic gutter. Given symmetry this is likely physiologic, however can be seen in the setting of ovarian cyst rupture. No organized fluid collection. 3. Mild prominence of  the right ureter but hydronephrosis, ureteral calculus or inflammation. This may be incidental, or secondary to recently passed stone or urinary tract infection. Electronically Signed   By: Narda Rutherford M.D.   On: 01/24/2020 01:51    ____________________________________________   PROCEDURES  Procedure(s) performed (including Critical Care):  Procedures   ____________________________________________   INITIAL IMPRESSION / ASSESSMENT AND PLAN / ED COURSE  As part of my medical decision making, I reviewed the following data within the electronic MEDICAL RECORD NUMBER History obtained from family, Nursing notes reviewed and incorporated, Labs reviewed, Old chart reviewed (noted clinic visits mainly for GAD), Radiograph reviewed and Notes from prior ED visits     24 year old female presenting with periumbilical lower abdominal pain. Differential diagnosis includes, but is not limited to, ovarian cyst, ovarian torsion, acute appendicitis, diverticulitis, urinary tract infection/pyelonephritis, endometriosis, bowel obstruction, colitis, renal colic, gastroenteritis, hernia, fibroids, endometriosis, pregnancy  related pain including ectopic pregnancy, etc.  Laboratory results demonstrate mild leukocytosis, unremarkable metabolic panel and UA.  Will initiate IV hydration, IV Morphine for pain paired with IV Zofran for nausea.  Will proceed with CT abdomen/pelvis to evaluate etiology of patient's symptoms.   Clinical Course as of Jan 23 201  Sheral FlowMon Jan 24, 2020  0124 Came to check on patient who is currently in CT scan.   [JS]  0158 Updated patient and spouse of CT imaging results.  She is feeling significantly better.  She has a gynecologist and will follow up as an outpatient.  Will prescribe limited quantity Percocet and Zofran to use as needed.  Strict return precautions given.  Patient and spouse verbalized understanding agree with plan of care.   [JS]    Clinical Course User Index [JS]  Irean HongSung, Starlyn Droge J, MD     ____________________________________________   FINAL CLINICAL IMPRESSION(S) / ED DIAGNOSES  Final diagnoses:  Pelvic pain in female  Free fluid in pelvis  Periumbilical abdominal pain     ED Discharge Orders         Ordered    oxyCODONE-acetaminophen (PERCOCET/ROXICET) 5-325 MG tablet  Every 4 hours PRN        01/24/20 0200    ondansetron (ZOFRAN ODT) 4 MG disintegrating tablet  Every 8 hours PRN        01/24/20 0200          *Please note:  Stephanie Hampton was evaluated in Emergency Department on 01/24/2020 for the symptoms described in the history of present illness. She was evaluated in the context of the global COVID-19 pandemic, which necessitated consideration that the patient might be at risk for infection with the SARS-CoV-2 virus that causes COVID-19. Institutional protocols and algorithms that pertain to the evaluation of patients at risk for COVID-19 are in a state of rapid change based on information released by regulatory bodies including the CDC and federal and state organizations. These policies and algorithms were followed during the patient's care in the ED.  Some ED evaluations and interventions may be delayed as a result of limited staffing during and the pandemic.*   Note:  This document was prepared using Dragon voice recognition software and may include unintentional dictation errors.   Irean HongSung, Weslynn Ke J, MD 01/24/20 0330

## 2020-01-24 ENCOUNTER — Encounter: Payer: Self-pay | Admitting: Radiology

## 2020-01-24 ENCOUNTER — Emergency Department: Payer: BC Managed Care – PPO

## 2020-01-24 MED ORDER — ONDANSETRON 4 MG PO TBDP: 4.0000 mg | ORAL_TABLET | Freq: Three times a day (TID) | ORAL | 0 refills | Status: DC | PRN

## 2020-01-24 MED ORDER — OXYCODONE-ACETAMINOPHEN 5-325 MG PO TABS
1.0000 | ORAL_TABLET | Freq: Once | ORAL | Status: AC
  Administered 2020-01-24: 1 via ORAL
  Filled 2020-01-24: qty 1

## 2020-01-24 MED ORDER — IOHEXOL 300 MG/ML  SOLN
100.0000 mL | Freq: Once | INTRAMUSCULAR | Status: AC | PRN
  Administered 2020-01-24: 100 mL via INTRAVENOUS

## 2020-01-24 MED ORDER — OXYCODONE-ACETAMINOPHEN 5-325 MG PO TABS: 1.0000 | ORAL_TABLET | ORAL | 0 refills | Status: DC | PRN

## 2020-01-24 NOTE — Discharge Instructions (Signed)
You may take Ibuprofen as needed for pain, Percocet (#15) as needed for more severe pain.  You may take Zofran (#15) as needed for nausea.  Your CT scan shows normal appendix, mild fluid in your pelvis that is most likely due to a ruptured ovarian cyst.  Return to the ER for worsening symptoms, persistent vomiting, difficulty breathing or other concerns.

## 2020-02-06 ENCOUNTER — Other Ambulatory Visit: Payer: Self-pay | Admitting: Certified Nurse Midwife

## 2020-02-15 ENCOUNTER — Ambulatory Visit (INDEPENDENT_AMBULATORY_CARE_PROVIDER_SITE_OTHER): Payer: BC Managed Care – PPO | Admitting: Certified Nurse Midwife

## 2020-02-15 ENCOUNTER — Encounter: Payer: Self-pay | Admitting: Certified Nurse Midwife

## 2020-02-15 ENCOUNTER — Other Ambulatory Visit: Payer: Self-pay

## 2020-02-15 VITALS — BP 103/61 | HR 83 | Ht 67.0 in | Wt 213.4 lb

## 2020-02-15 DIAGNOSIS — Z8742 Personal history of other diseases of the female genital tract: Secondary | ICD-10-CM | POA: Diagnosis not present

## 2020-02-15 DIAGNOSIS — R102 Pelvic and perineal pain: Secondary | ICD-10-CM | POA: Diagnosis not present

## 2020-02-15 NOTE — Progress Notes (Signed)
GYN ENCOUNTER NOTE  Subjective:       Stephanie Hampton is a 24 y.o. G32P1001 female is here for gynecologic evaluation of the following issues:  1. Worsening pelvic pain x 3 wks. Pt state she was in the ED several weeks ago and was noted that she had "free fluid in her pelvis" that suggested she has a ruptured ovarian cyst. The pain got better but over the past several weeks it is worsening. She denies any changes in bladder or bowel habits, She denies any changes in her cycle. She has heavy painful bleeding every 2 wks ( has IUD) .She has a laparoscopic procedure with Dr. Stoney Bang a few yrs ago for endometriosis. She has used Lupron in the past for management.    Gynecologic History Patient's last menstrual period was 02/02/2020. Contraception: IUD Last Pap: 07/13/2019 Results were: normal Last mammogram: n/a.  Obstetric History OB History  Gravida Para Term Preterm AB Living  3 1 1     1   SAB TAB Ectopic Multiple Live Births          1    # Outcome Date GA Lbr Len/2nd Weight Sex Delivery Anes PTL Lv  3 Gravida           2 Term 2014 [redacted]w[redacted]d  7 lb 4.8 oz (3.311 kg) F Vag-Spont   LIV  1 Gravida             Past Medical History:  Diagnosis Date  . 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 Depression   . Endometriosis   . Ovarian cyst     Past Surgical History:  Procedure Laterality Date  . CHOLECYSTECTOMY    . LAPAROSCOPIC OVARIAN CYSTECTOMY Left 01/22/2016   Procedure: LAPAROSCOPIC LEFT OVARIAN CYSTECTOMY-POSSIBLE BIOPSIES;  Surgeon: 01/24/2016, MD;  Location: [facility] ORS;  Service: Gynecology;  Laterality: Left;  excision/fulgeration of endometriosis  . NO PAST SURGERIES      Current Outpatient Medications on File Prior to Visit  Medication Sig Dispense Refill  . levonorgestrel (MIRENA) 20 MCG/24HR IUD 1 each by Intrauterine route once.    . sertraline (ZOLOFT) 50 MG tablet TAKE 1 TABLET BY MOUTH EVERY DAY 30 tablet 4  .  Albuterol Sulfate 108 (90 Base) MCG/ACT AEPB Inhale into the lungs.    . docusate sodium (COLACE) 100 MG capsule Take 1 capsule (100 mg total) by mouth daily. 30 capsule 2  . etodolac (LODINE) 500 MG tablet Take 500 mg by mouth 2 (two) times daily.    . Prenatal Vit-Fe Fumarate-FA (PRENATAL PO) Take 1 tablet by mouth every evening.    . promethazine (PHENERGAN) 25 MG tablet Take 1 tablet (25 mg total) by mouth every 6 (six) hours as needed for nausea or vomiting. 30 tablet 2  . ranitidine (ZANTAC) 150 MG capsule Take 1 capsule (150 mg total) by mouth 2 (two) times daily. 28 capsule 0  . sucralfate (CARAFATE) 1 g tablet Take 1 tablet (1 g total) by mouth 4 (four) times daily. 120 tablet 1  . traMADol (ULTRAM) 50 MG tablet Take 50 mg by mouth 3 (three) times daily as needed.     No current facility-administered medications on file prior to visit.    Allergies  Allergen Reactions  . Augmentin [Amoxicillin-Pot Clavulanate] Hives    Has patient had a PCN reaction causing immediate rash, facial/tongue/throat swelling, SOB or lightheadedness with hypotension: No Has patient had a PCN reaction causing severe rash  involving mucus membranes or skin necrosis: No Has patient had a PCN reaction that required hospitalization: No Has patient had a PCN reaction occurring within the last 10 years: Yes If all of the above answers are "NO", then may proceed with Cephalosporin use.    . Metoclopramide Anxiety    Social History   Socioeconomic History  . Marital status: Married    Spouse name: Ree Kida  . Number of children: 2  . Years of education: 10  . Highest education level: High school graduate  Occupational History  . Occupation: not employeed   Tobacco Use  . Smoking status: Never Smoker  . Smokeless tobacco: Never Used  Vaping Use  . Vaping Use: Every day  Substance and Sexual Activity  . Alcohol use: No    Alcohol/week: 0.0 standard drinks  . Drug use: No  . Sexual activity: Yes     Birth control/protection: I.U.D.    Comment: Possible BTL   Other Topics Concern  . Not on file  Social History Narrative   Patient has been married since 2014 and reprots that overall the marriage is supportive. She has two children 7yo and 58 months old. She has not worked in the past year due to [employer], having a child in virtual school, and having a child that she did not want to put in daycare. She plans to return to work in the near future. She reports having supportive family relationships and having a few friends.    Social Determinants of Health   Financial Resource Strain:   . Difficulty of Paying Living Expenses: Not on file  Food Insecurity:   . Worried About Programme researcher, broadcasting/film/video in the Last Year: Not on file  . Ran Out of Food in the Last Year: Not on file  Transportation Needs:   . Lack of Transportation (Medical): Not on file  . Lack of Transportation (Non-Medical): Not on file  Physical Activity: Sufficiently Active  . Days of Exercise per Week: 4 days  . Minutes of Exercise per Session: 120 min  Stress: Stress Concern Present  . Feeling of Stress : Rather much  Social Connections: Moderately Isolated  . Frequency of Communication with Friends and Family: More than three times a week  . Frequency of Social Gatherings with Friends and Family: Twice a week  . Attends Religious Services: Never  . Active Member of Clubs or Organizations: No  . Attends Banker Meetings: Never  . Marital Status: Married  Catering manager Violence:   . Fear of Current or Ex-Partner: Not on file  . Emotionally Abused: Not on file  . Physically Abused: Not on file  . Sexually Abused: Not on file    Family History  Problem Relation Age of Onset  . Heart disease Father   . Depression Mother   . Cancer Neg Hx   . Ovarian cancer Neg Hx   . Colon cancer Neg Hx   . Diabetes Neg Hx     The following portions of the patient's history were reviewed and updated as appropriate:  allergies, current medications, past family history, past medical history, past social history, past surgical history and problem list.  Review of Systems Review of Systems - Negative except as mentioned in HPI Review of Systems - General ROS: negative for - chills, fatigue, fever, hot flashes, malaise or night sweats Hematological and Lymphatic ROS: negative for - bleeding problems or swollen lymph nodes Gastrointestinal ROS: negative for - abdominal pain, blood in  stools, change in bowel habits and nausea/vomiting Musculoskeletal ROS: negative for - joint pain, muscle pain or muscular weakness Genito-Urinary ROS: negative for - change in menstrual cycle, dysmenorrhea, dyspareunia, dysuria, genital discharge, genital ulcers, hematuria, incontinence, irregular/heavy menses, nocturia. Positive for pelvic pain.  Objective:   BP 103/61   Pulse 83   Ht 5\' 7"  (1.702 m)   Wt 213 lb 7 oz (96.8 kg)   LMP 02/02/2020   BMI 33.43 kg/m  CONSTITUTIONAL: Well-developed, well-nourished female in no acute distress.  HENT:  Normocephalic, atraumatic.  NECK: Normal range of motion, supple, no masses.  Normal thyroid.  SKIN: Skin is warm and dry. No rash noted. Not diaphoretic. No erythema. No pallor. NEUROLGIC: Alert and oriented to person, place, and time. PSYCHIATRIC: Normal mood and affect. Normal behavior. Normal judgment and thought content. CARDIOVASCULAR:Not Examined RESPIRATORY: Not Examined BREASTS: Not Examined ABDOMEN: Soft, non distended; Non tender.  No Organomegaly. PELVIC: u/s ordered , exam not indicated MUSCULOSKELETAL: Normal range of motion. No tenderness.  No cyanosis, clubbing, or edema.     Assessment:   Pelvic pain    Plan:   Pt is interested in hysterotomy, state she is done having children and is tiered of being in pain. It is effecting her daily lifestyle and feels like she has tried several treatment options which have helped some but ultimately not solved her pain.  Discussed medication management, Discussed u/s to rule out new cysts, correct IUD placement, evaluate for presence of remaining free fluid. Discussed medication management with alternate BC, another trial of Lupron ( or similar Medication), motrin and tylenol.  Discussed hysterectomy and potential for complications and continued pain due to scar tissue and endometrial tissue. She verbalizes understanding . She agrees to u/s and would like to consult MD. Pt encouraged to schedule with Dr. 13/05/2019 or Dr. Logan Bores to discuss hysterectomy. Will follow up with u/s results.   Valentino Saxon, CNM

## 2020-02-16 ENCOUNTER — Encounter: Payer: BC Managed Care – PPO | Admitting: Certified Nurse Midwife

## 2020-02-17 ENCOUNTER — Ambulatory Visit (INDEPENDENT_AMBULATORY_CARE_PROVIDER_SITE_OTHER): Payer: BC Managed Care – PPO | Admitting: Obstetrics and Gynecology

## 2020-02-17 ENCOUNTER — Other Ambulatory Visit: Payer: Self-pay

## 2020-02-17 ENCOUNTER — Ambulatory Visit (INDEPENDENT_AMBULATORY_CARE_PROVIDER_SITE_OTHER): Payer: BC Managed Care – PPO

## 2020-02-17 ENCOUNTER — Encounter: Payer: Self-pay | Admitting: Obstetrics and Gynecology

## 2020-02-17 VITALS — BP 108/72 | HR 93 | Ht 67.0 in | Wt 215.6 lb

## 2020-02-17 DIAGNOSIS — N809 Endometriosis, unspecified: Secondary | ICD-10-CM

## 2020-02-17 DIAGNOSIS — R102 Pelvic and perineal pain: Secondary | ICD-10-CM | POA: Diagnosis not present

## 2020-02-17 DIAGNOSIS — Z87798 Personal history of other (corrected) congenital malformations: Secondary | ICD-10-CM

## 2020-02-17 DIAGNOSIS — G8929 Other chronic pain: Secondary | ICD-10-CM

## 2020-02-17 NOTE — Progress Notes (Addendum)
HPI:      [name] is a 24 y.o. G3P1001 who LMP was Patient's last menstrual period was 02/02/2020.  Subjective:   She presents today because she would like to discuss the possibility of hysterectomy.  She began this conversation with Dr. Greggory Hampton several years ago when she was diagnosed with endometriosis.  Because of her constant pelvic discomfort they discussed this as a possibility after she had completed childbearing.  She now states that she has completed childbearing and has no doubts about this.  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  Her cardiologist informed her that she has a 50% chance of passing on the gene to her children.  She and her husband have discussed this in great detail and have mutually decided not to take this risk with any more children. (She states that had she known prior to her current children she would have considered sterilization)   She describes daily pelvic pain specifically with activities which make the pain worse toward the end of the day.  It is also present with intercourse.  She has tried a Mirena IUD which has made her periods heavier and more irregular and has not mitigated the pain.  She previously has used Depo-Lupron for endometriosis and this did not resolve the pain to a satisfactory level.    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 She does not have any pertinent problems on file. She  has a past surgical history that includes No past surgeries; Laparoscopic ovarian cystectomy (Left, 01/22/2016); and Cholecystectomy. Her family history includes Depression in her mother; Heart disease in her father. She  reports  that she has never smoked. She has never used smokeless tobacco. She reports that she does not drink alcohol and does not use drugs. She has a current medication list which includes the following prescription(s): levonorgestrel and sertraline. She is allergic to augmentin [amoxicillin-pot clavulanate] and metoclopramide.       Review of Systems:  Review of Systems  Constitutional: Denied constitutional symptoms, night sweats, recent illness, fatigue, fever, insomnia and weight loss.  Eyes: Denied eye symptoms, eye pain, photophobia, vision change and visual disturbance.  Ears/Nose/Throat/Neck: Denied ear, nose, throat or neck symptoms, hearing loss, nasal discharge, sinus congestion and sore throat.  Cardiovascular: Denied cardiovascular symptoms, arrhythmia, chest pain/pressure, edema, exercise intolerance, orthopnea and palpitations.  Respiratory: Denied pulmonary symptoms, asthma, pleuritic pain, productive sputum, cough, dyspnea and wheezing.  Gastrointestinal: Denied, gastro-esophageal reflux, melena, nausea and vomiting.  Genitourinary: See HPI for additional information.  Musculoskeletal: Denied musculoskeletal symptoms, stiffness, swelling, muscle weakness and myalgia.  Dermatologic: Denied dermatology symptoms, rash and scar.  Neurologic: Denied neurology symptoms, dizziness, headache, neck pain and syncope.  Psychiatric: Denied psychiatric symptoms, anxiety and depression.  Endocrine: Denied endocrine symptoms including hot flashes and night sweats.   Meds:   Current Outpatient Medications on File Prior to Visit  Medication Sig Dispense Refill  . levonorgestrel (MIRENA) 20 MCG/24HR IUD 1 each by Intrauterine route once.    . sertraline (ZOLOFT) 50 MG tablet TAKE 1 TABLET BY MOUTH EVERY DAY 30 tablet 4   No current facility-administered medications on file prior  to visit.       The pregnancy intention screening data noted above was reviewed. Potential methods of contraception  were discussed. The patient elected to proceed with IUD or IUS.     Objective:     Vitals:   02/17/20 1109  BP: 108/72  Pulse: 93   Filed Weights   02/17/20 1109  Weight: 215 lb 9.6 oz (97.8 kg)                Assessment:    G3P1001 Patient Active Problem List   Diagnosis Date Noted  . History of ovarian cyst 02/15/2020  . Gallstones 01/12/2018  . Family history of cardiac disorder 01/08/2018  . RUQ pain 01/08/2018  . 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 Syncope 01/06/2018  . BMI 33.0-33.9,adult 07/09/2017  . Hx of migraine headaches 07/09/2017  . Endometriosis determined by laparoscopy 01/30/2016  . Pelvic pain 01/17/2016     1. Chronic pelvic pain in female   2. Endometriosis   3. History of congenital or genetic condition     Patient strongly desires hysterectomy.   Plan:            1.  After long discussion with [name] I believe that she has come to this decision rationally.  This is not a rash opinion reached without consideration.  She has thought about this for a number of years, has discussed it in detail with her husband and they mutually agree on this course of action.  2.  Her failure of Depo-Lupron and Mirena IUD adds credence to her consideration for surgery while it also calls into question the endometriosis as the sole source of pain.  3.  Some of her symptoms described today sound as if they are consistent with some degree of pelvic prolapse.  We will determine this at her next physical exam visit.  Patient would like to return for preop discussion and history and physical.  At that time we will further discuss ovaries and endometriosis in light of possible oophorectomy.  Order No orders of the defined types were placed in this encounter.   No orders of the defined types were placed in this encounter.     F/U  Return in about 2 weeks (around 03/02/2020). I spent 34 minutes involved in the care of this patient preparing to see the patient  by obtaining and reviewing her medical history (including labs, imaging tests and prior procedures), documenting clinical information in the electronic health record (EHR), counseling and coordinating care plans, writing and sending prescriptions, ordering tests or procedures and directly communicating with the patient by discussing pertinent items from her history and physical exam as well as detailing my assessment and plan as noted above so that she has an informed understanding.  All of her questions were answered.  Elonda Husky, M.D. 02/17/2020 1:22 PM

## 2020-02-17 NOTE — Telephone Encounter (Signed)
JW is requesting the pt have a u/s before she sees Dr. Logan Bores. I called the pt and [facility] stating that we need the u/s before she has her consults with the provider.  Let JW know I left a message

## 2020-03-02 ENCOUNTER — Other Ambulatory Visit: Payer: Self-pay

## 2020-03-02 ENCOUNTER — Ambulatory Visit (INDEPENDENT_AMBULATORY_CARE_PROVIDER_SITE_OTHER): Payer: BC Managed Care – PPO | Admitting: Obstetrics and Gynecology

## 2020-03-02 ENCOUNTER — Encounter: Payer: Self-pay | Admitting: Obstetrics and Gynecology

## 2020-03-02 VITALS — BP 110/80 | HR 82 | Ht 64.0 in | Wt 212.3 lb

## 2020-03-02 DIAGNOSIS — Z87798 Personal history of other (corrected) congenital malformations: Secondary | ICD-10-CM

## 2020-03-02 DIAGNOSIS — N809 Endometriosis, unspecified: Secondary | ICD-10-CM | POA: Diagnosis not present

## 2020-03-02 DIAGNOSIS — G8929 Other chronic pain: Secondary | ICD-10-CM

## 2020-03-02 DIAGNOSIS — Z01818 Encounter for other preprocedural examination: Secondary | ICD-10-CM

## 2020-03-02 DIAGNOSIS — R102 Pelvic and perineal pain: Secondary | ICD-10-CM | POA: Diagnosis not present

## 2020-03-02 DIAGNOSIS — N8111 Cystocele, midline: Secondary | ICD-10-CM

## 2020-03-02 MED ORDER — METRONIDAZOLE 500 MG PO TABS: 500.0000 mg | ORAL_TABLET | Freq: Two times a day (BID) | ORAL | 0 refills | Status: DC

## 2020-03-02 NOTE — Progress Notes (Signed)
PRE-OPERATIVE HISTORY AND PHYSICAL EXAM   Subjective:   HPI:  Stephanie Hampton is a 24 y.o. G3P1001.  Patient's last menstrual period was 02/02/2020.  She presents today for a pre-op discussion and PE.  She presents today because she would like to schedule a hysterectomy.  She began this conversation with Dr. Greggory Keen several years ago when she was diagnosed with endometriosis.  Because of her constant pelvic discomfort they discussed this as a possibility after she had completed childbearing.  She now states that she has completed childbearing and has no doubts about this.  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  Her cardiologist informed her that she has a 50% chance of passing on the gene to her children.  She and her husband have discussed this in great detail and have mutually decided not to take this risk with any more children. (She states that had she known prior to her current children she would have considered sterilization)   She describes daily pelvic pain specifically with activities which make the pain worse toward the end of the day.  It is also present with intercourse.  She has tried a Mirena IUD which has made her periods heavier and more irregular and has not mitigated the pain.  She previously has used Depo-Lupron for endometriosis and this did not resolve the pain to a satisfactory level. She has reaffirmed this again today.  She has no doubts in her mind that hysterectomy is her best option. Patient describes occasional urine loss with coughing laughing sneezing.  This does not happen daily and states she does not have to wear a pad daily.   Review of Systems:   Constitutional: Denied constitutional symptoms, night sweats, recent illness, fatigue, fever, insomnia and weight loss.  Eyes: Denied eye symptoms, eye pain, photophobia, vision change and visual  disturbance.  Ears/Nose/Throat/Neck: Denied ear, nose, throat or neck symptoms, hearing loss, nasal discharge, sinus congestion and sore throat.  Cardiovascular: Denied cardiovascular symptoms, arrhythmia, chest pain/pressure, edema, exercise intolerance, orthopnea and palpitations.  Respiratory: Denied pulmonary symptoms, asthma, pleuritic pain, productive sputum, cough, dyspnea and wheezing.  Gastrointestinal: Denied, gastro-esophageal reflux, melena, nausea and vomiting.  Genitourinary: See HPI for additional information.  Musculoskeletal: Denied musculoskeletal symptoms, stiffness, swelling, muscle weakness and myalgia.  Dermatologic: Denied dermatology symptoms, rash and scar.  Neurologic: Denied neurology symptoms, dizziness, headache, neck pain and syncope.  Psychiatric: Denied psychiatric symptoms, anxiety and depression.  Endocrine: Denied endocrine symptoms including hot flashes and night sweats.   OB History  Gravida Para Term Preterm AB Living  3 1 1     1   SAB TAB Ectopic Multiple Live Births          1    # Outcome Date GA Lbr Len/2nd Weight Sex Delivery Anes PTL Lv  3 Gravida           2 Term 2014 [redacted]w[redacted]d  7 lb 4.8 oz (3.311 kg) F Vag-Spont   LIV  1 Gravida             Past Medical History:  Diagnosis Date  . 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 Depression   . Endometriosis   . Ovarian cyst     Past Surgical History:  Procedure Laterality Date  . CHOLECYSTECTOMY    . LAPAROSCOPIC OVARIAN CYSTECTOMY Left 01/22/2016  Procedure: LAPAROSCOPIC LEFT OVARIAN CYSTECTOMY-POSSIBLE BIOPSIES;  Surgeon: Herold Harms, MD;  Location: [facility] ORS;  Service: Gynecology;  Laterality: Left;  excision/fulgeration of endometriosis  . NO PAST SURGERIES        SOCIAL HISTORY:  Social History   Tobacco Use  Smoking Status Never Smoker  Smokeless Tobacco Never Used   Social History   Substance and Sexual Activity  Alcohol Use  No  . Alcohol/week: 0.0 standard drinks    Social History   Substance and Sexual Activity  Drug Use No    Family History  Problem Relation Age of Onset  . Heart disease Father   . Depression Mother   . Cancer Neg Hx   . Ovarian cancer Neg Hx   . Colon cancer Neg Hx   . Diabetes Neg Hx     ALLERGIES:  Augmentin [amoxicillin-pot clavulanate] and Metoclopramide  MEDS:   Current Outpatient Medications on File Prior to Visit  Medication Sig Dispense Refill  . levonorgestrel (MIRENA) 20 MCG/24HR IUD 1 each by Intrauterine route once.    . sertraline (ZOLOFT) 50 MG tablet TAKE 1 TABLET BY MOUTH EVERY DAY 30 tablet 4   No current facility-administered medications on file prior to visit.    Meds ordered this encounter  Medications  . metroNIDAZOLE (FLAGYL) 500 MG tablet    Sig: Take 1 tablet (500 mg total) by mouth 2 (two) times daily. Begin 5 days prior to scheduled surgery as directed.    Dispense:  10 tablet    Refill:  0     Physical examination BP 110/80   Pulse 82   Ht 5\' 4"  (1.626 m)   Wt 212 lb 4.8 oz (96.3 kg)   LMP 02/02/2020   BMI 36.44 kg/m   General NAD, Conversant  HEENT Atraumatic; Op clear with mmm.  Normo-cephalic. Pupils reactive. Anicteric sclerae  Thyroid/Neck Smooth without nodularity or enlargement. Normal ROM.  Neck Supple.  Skin No rashes, lesions or ulceration. Normal palpated skin turgor. No nodularity.  Breasts: No masses or discharge.  Symmetric.  No axillary adenopathy.  Lungs: Clear to auscultation.No rales or wheezes. Normal Respiratory effort, no retractions.  Heart: NSR.  No murmurs or rubs appreciated. No periferal edema  Abdomen: Soft.  Non-tender.  No masses.  No HSM. No hernia  Extremities: Moves all appropriately.  Normal ROM for age. No lymphadenopathy.  Neuro: Oriented to PPT.  Normal mood. Normal affect.     Pelvic:   Vulva: Normal appearance.  No lesions.  Vagina: No lesions or abnormalities noted.  Support:   Second-degree midline cystocele  Urethra No masses tenderness or scarring.  Meatus Normal size without lesions or prolapse.  Cervix: Normal ectropion.  No lesions.  Anus: Normal exam.  No lesions.  Perineum: Normal exam.  No lesions.        Bimanual   Uterus: Normal size.  Non-tender.  Mobile.  AV.  Adnexae: No masses.  Non-tender to palpation.  Cul-de-sac: Negative for abnormality.   Assessment:   G3P1001 Patient Active Problem List   Diagnosis Date Noted  . History of ovarian cyst 02/15/2020  . Gallstones 01/12/2018  . Family history of cardiac disorder 01/08/2018  . RUQ pain 01/08/2018  . 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 Syncope 01/06/2018  . BMI 33.0-33.9,adult 07/09/2017  . Hx of migraine headaches 07/09/2017  . Endometriosis determined by laparoscopy 01/30/2016  . Pelvic pain 01/17/2016    1. Preop examination   2. Chronic pelvic pain in female  3. Endometriosis   4. History of congenital or genetic condition   5. Cystocele, midline     Patient desires hysterectomy because of pelvic pain, genetic heart condition, and history of endometriosis.  She has declined other options and has unsuccessfully tried hormonal and nonhormonal methods to alleviate her pain.   Plan:   Orders: Meds ordered this encounter  Medications  . metroNIDAZOLE (FLAGYL) 500 MG tablet    Sig: Take 1 tablet (500 mg total) by mouth 2 (two) times daily. Begin 5 days prior to scheduled surgery as directed.    Dispense:  10 tablet    Refill:  0     1.  LAVH BS Possible anterior repair  Pre-op discussions regarding Risks and Benefits of her scheduled surgery.  LAVH The procedure of Laparoscopic Assisted Vaginal Hysterectomy was described to the patient in detail.  We reviewed the rationale for Hysterectomy and the patient was again informed of other nonsurgical management possibilities for her condition.  She has considered these other options, and desires a Hysterectomy.  We have  reviewed the fact that Hysterectomy is permanent and that following the procedure she will not be able to become pregnant or bear children.  We have discussed the following risk factors specifically and the patient has also been informed that additional complications not mentioned may develop:  Damage to bowel, bladder, ureters or to other internal organs, bleeding, infection and the risk from anesthesia.  We have discussed the procedure itself in detail and she has an informed understanding of this surgery.  We have also discussed the recovery period in which physical and sexual activity will be restricted for a varying degree of time, often 3 - 6 weeks. The Laparoscopic Portion of Hysterectomy has also been reviewed with the patient.  She understands how the laparoscope facilitates the procedure.  We have discussed the abdominal incisions and punctures that will be used.  We have also reviewed the increased Operating Room time often accompanying LAVH.  The slightly increased risk of complications secondary to abdominal punctures, and use of laparoscopic instrumentation has also been discussed in detail.I have answered all of her questions and I believe the patient has an informed understanding of the procedure of Laparoscopic Assisted Vaginal Hysterectomy. Oophorectomy The option of Oophorectomy has been discussed with the patient.  Detailed risk/benefits have been reviewed.  The risks discussed include, but are not limited to, hemorrhage, infection, damage to ureter or other internal organ, and Ovarian Remnant Syndrome.  The benefits include a significant decrease in the risk of Ovarian Cancer and in benign Ovarian disease.  The risk of Ovarian CA has been estimated at 1 in 70.  This is a relatively small risk.  However, should Ovarian CA develop, it is often found late in the course of the disease.  We have also discussed the role of inheritance in the development of Ovarian disease.  Some women, who have  close relatives with Ovarian CA, have a higher than 1 in 70 risk of Ovarian CA.  The benefits of Estrogen replacement therapy following Oophorectomy has been stressed.  If she is premenopausal, we have discussed the fact that this procedure will make her permanently sterile and that premature menopause will result if no ERT is begun.  I have answered all of her questions, and I believe that she has an adequate and informed understanding of the risks and benefits of Oophorectomy. Patient has decided to keep her ovaries.  We have discussed this in light of her  endometriosis and I have agreed with her decision. Anterior Repair I have discussed the procedure of anterior repair and Kelly placation.  I have informed the patient that this procedure often corrects or improves stress urinary incontinence, but that there is certainly no guarantee of her improvement.  The procedure itself was discussed.  The possible damage to the bowel, ureters or urethra was also discussed.  We have reviewed the repositioning of the bladder that often takes place at anterior repair and I have informed her that although unlikely, it is possible that a worsening of her incontinence could occur after this procedure.  I have also discussed with her the necessity of decreased lifting and physical activity following the procedure as well as the possibility that as she gets older, her stress urinary incontinence could slowly return.  The use of vaginal Estrogen or oral Estrogen as well as other medications in the role of both stress and bladder dysynergia incontinence were discussed.  I have discussed the complication of inability to void immediately following the procedure.  The patient is aware that she may go home using a Foley catheter or may be taught the technique of self-catheterization should a complication develop.  All of her questions have been answered and I believe that she has an informed understanding of anterior repair/Kelly  plication. It has been decided that I would evaluate the patient's pelvic relaxation after hysterectomy and decide upon anterior repair as necessary.  I spent 48 minutes involved in the care of this patient preparing to see the patient by obtaining and reviewing her medical history (including labs, imaging tests and prior procedures), documenting clinical information in the electronic health record (EHR), counseling and coordinating care plans, writing and sending prescriptions, ordering tests or procedures and directly communicating with the patient by discussing pertinent items from her history and physical exam as well as detailing my assessment and plan as noted above so that she has an informed understanding.  All of her questions were answered.  Elonda Husky, M.D. 03/02/2020 12:33 PM

## 2020-03-02 NOTE — H&P (View-Only) (Signed)
PRE-OPERATIVE HISTORY AND PHYSICAL EXAM   Subjective:   HPI:  Stephanie Hampton is a 24 y.o. G3P1001.  Patient's last menstrual period was 02/02/2020.  She presents today for a pre-op discussion and PE.  She presents today because she would like to schedule a hysterectomy.  She began this conversation with Dr. Greggory Keen several years ago when she was diagnosed with endometriosis.  Because of her constant pelvic discomfort they discussed this as a possibility after she had completed childbearing.  She now states that she has completed childbearing and has no doubts about this.  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  Her cardiologist informed her that she has a 50% chance of passing on the gene to her children.  She and her husband have discussed this in great detail and have mutually decided not to take this risk with any more children. (She states that had she known prior to her current children she would have considered sterilization)   She describes daily pelvic pain specifically with activities which make the pain worse toward the end of the day.  It is also present with intercourse.  She has tried a Mirena IUD which has made her periods heavier and more irregular and has not mitigated the pain.  She previously has used Depo-Lupron for endometriosis and this did not resolve the pain to a satisfactory level. She has reaffirmed this again today.  She has no doubts in her mind that hysterectomy is her best option. Patient describes occasional urine loss with coughing laughing sneezing.  This does not happen daily and states she does not have to wear a pad daily.   Review of Systems:   Constitutional: Denied constitutional symptoms, night sweats, recent illness, fatigue, fever, insomnia and weight loss.  Eyes: Denied eye symptoms, eye pain, photophobia, vision change and visual  disturbance.  Ears/Nose/Throat/Neck: Denied ear, nose, throat or neck symptoms, hearing loss, nasal discharge, sinus congestion and sore throat.  Cardiovascular: Denied cardiovascular symptoms, arrhythmia, chest pain/pressure, edema, exercise intolerance, orthopnea and palpitations.  Respiratory: Denied pulmonary symptoms, asthma, pleuritic pain, productive sputum, cough, dyspnea and wheezing.  Gastrointestinal: Denied, gastro-esophageal reflux, melena, nausea and vomiting.  Genitourinary: See HPI for additional information.  Musculoskeletal: Denied musculoskeletal symptoms, stiffness, swelling, muscle weakness and myalgia.  Dermatologic: Denied dermatology symptoms, rash and scar.  Neurologic: Denied neurology symptoms, dizziness, headache, neck pain and syncope.  Psychiatric: Denied psychiatric symptoms, anxiety and depression.  Endocrine: Denied endocrine symptoms including hot flashes and night sweats.   OB History  Gravida Para Term Preterm AB Living  3 1 1     1   SAB TAB Ectopic Multiple Live Births          1    # Outcome Date GA Lbr Len/2nd Weight Sex Delivery Anes PTL Lv  3 Gravida           2 Term 2014 [redacted]w[redacted]d  7 lb 4.8 oz (3.311 kg) F Vag-Spont   LIV  1 Gravida             Past Medical History:  Diagnosis Date  . 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 Depression   . Endometriosis   . Ovarian cyst     Past Surgical History:  Procedure Laterality Date  . CHOLECYSTECTOMY    . LAPAROSCOPIC OVARIAN CYSTECTOMY Left 01/22/2016  Procedure: LAPAROSCOPIC LEFT OVARIAN CYSTECTOMY-POSSIBLE BIOPSIES;  Surgeon: Herold Harms, MD;  Location: [facility] ORS;  Service: Gynecology;  Laterality: Left;  excision/fulgeration of endometriosis  . NO PAST SURGERIES        SOCIAL HISTORY:  Social History   Tobacco Use  Smoking Status Never Smoker  Smokeless Tobacco Never Used   Social History   Substance and Sexual Activity  Alcohol Use  No  . Alcohol/week: 0.0 standard drinks    Social History   Substance and Sexual Activity  Drug Use No    Family History  Problem Relation Age of Onset  . Heart disease Father   . Depression Mother   . Cancer Neg Hx   . Ovarian cancer Neg Hx   . Colon cancer Neg Hx   . Diabetes Neg Hx     ALLERGIES:  Augmentin [amoxicillin-pot clavulanate] and Metoclopramide  MEDS:   Current Outpatient Medications on File Prior to Visit  Medication Sig Dispense Refill  . levonorgestrel (MIRENA) 20 MCG/24HR IUD 1 each by Intrauterine route once.    . sertraline (ZOLOFT) 50 MG tablet TAKE 1 TABLET BY MOUTH EVERY DAY 30 tablet 4   No current facility-administered medications on file prior to visit.    Meds ordered this encounter  Medications  . metroNIDAZOLE (FLAGYL) 500 MG tablet    Sig: Take 1 tablet (500 mg total) by mouth 2 (two) times daily. Begin 5 days prior to scheduled surgery as directed.    Dispense:  10 tablet    Refill:  0     Physical examination BP 110/80   Pulse 82   Ht 5\' 4"  (1.626 m)   Wt 212 lb 4.8 oz (96.3 kg)   LMP 02/02/2020   BMI 36.44 kg/m   General NAD, Conversant  HEENT Atraumatic; Op clear with mmm.  Normo-cephalic. Pupils reactive. Anicteric sclerae  Thyroid/Neck Smooth without nodularity or enlargement. Normal ROM.  Neck Supple.  Skin No rashes, lesions or ulceration. Normal palpated skin turgor. No nodularity.  Breasts: No masses or discharge.  Symmetric.  No axillary adenopathy.  Lungs: Clear to auscultation.No rales or wheezes. Normal Respiratory effort, no retractions.  Heart: NSR.  No murmurs or rubs appreciated. No periferal edema  Abdomen: Soft.  Non-tender.  No masses.  No HSM. No hernia  Extremities: Moves all appropriately.  Normal ROM for age. No lymphadenopathy.  Neuro: Oriented to PPT.  Normal mood. Normal affect.     Pelvic:   Vulva: Normal appearance.  No lesions.  Vagina: No lesions or abnormalities noted.  Support:   Second-degree midline cystocele  Urethra No masses tenderness or scarring.  Meatus Normal size without lesions or prolapse.  Cervix: Normal ectropion.  No lesions.  Anus: Normal exam.  No lesions.  Perineum: Normal exam.  No lesions.        Bimanual   Uterus: Normal size.  Non-tender.  Mobile.  AV.  Adnexae: No masses.  Non-tender to palpation.  Cul-de-sac: Negative for abnormality.   Assessment:   G3P1001 Patient Active Problem List   Diagnosis Date Noted  . History of ovarian cyst 02/15/2020  . Gallstones 01/12/2018  . Family history of cardiac disorder 01/08/2018  . RUQ pain 01/08/2018  . 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 Syncope 01/06/2018  . BMI 33.0-33.9,adult 07/09/2017  . Hx of migraine headaches 07/09/2017  . Endometriosis determined by laparoscopy 01/30/2016  . Pelvic pain 01/17/2016    1. Preop examination   2. Chronic pelvic pain in female  3. Endometriosis   4. History of congenital or genetic condition   5. Cystocele, midline     Patient desires hysterectomy because of pelvic pain, genetic heart condition, and history of endometriosis.  She has declined other options and has unsuccessfully tried hormonal and nonhormonal methods to alleviate her pain.   Plan:   Orders: Meds ordered this encounter  Medications  . metroNIDAZOLE (FLAGYL) 500 MG tablet    Sig: Take 1 tablet (500 mg total) by mouth 2 (two) times daily. Begin 5 days prior to scheduled surgery as directed.    Dispense:  10 tablet    Refill:  0     1.  LAVH BS Possible anterior repair

## 2020-03-02 NOTE — H&P (Signed)
PRE-OPERATIVE HISTORY AND PHYSICAL EXAM   Subjective:   HPI:  Stephanie Hampton is a 24 y.o. G3P1001.  Patient's last menstrual period was 02/02/2020.  She presents today for a pre-op discussion and PE.  She presents today because she would like to schedule a hysterectomy.  She began this conversation with Dr. Greggory Keen several years ago when she was diagnosed with endometriosis.  Because of her constant pelvic discomfort they discussed this as a possibility after she had completed childbearing.  She now states that she has completed childbearing and has no doubts about this.  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  Her cardiologist informed her that she has a 50% chance of passing on the gene to her children.  She and her husband have discussed this in great detail and have mutually decided not to take this risk with any more children. (She states that had she known prior to her current children she would have considered sterilization)   She describes daily pelvic pain specifically with activities which make the pain worse toward the end of the day.  It is also present with intercourse.  She has tried a Mirena IUD which has made her periods heavier and more irregular and has not mitigated the pain.  She previously has used Depo-Lupron for endometriosis and this did not resolve the pain to a satisfactory level. She has reaffirmed this again today.  She has no doubts in her mind that hysterectomy is her best option. Patient describes occasional urine loss with coughing laughing sneezing.  This does not happen daily and states she does not have to wear a pad daily.   Review of Systems:   Constitutional: Denied constitutional symptoms, night sweats, recent illness, fatigue, fever, insomnia and weight loss.  Eyes: Denied eye symptoms, eye pain, photophobia, vision change and visual  disturbance.  Ears/Nose/Throat/Neck: Denied ear, nose, throat or neck symptoms, hearing loss, nasal discharge, sinus congestion and sore throat.  Cardiovascular: Denied cardiovascular symptoms, arrhythmia, chest pain/pressure, edema, exercise intolerance, orthopnea and palpitations.  Respiratory: Denied pulmonary symptoms, asthma, pleuritic pain, productive sputum, cough, dyspnea and wheezing.  Gastrointestinal: Denied, gastro-esophageal reflux, melena, nausea and vomiting.  Genitourinary: See HPI for additional information.  Musculoskeletal: Denied musculoskeletal symptoms, stiffness, swelling, muscle weakness and myalgia.  Dermatologic: Denied dermatology symptoms, rash and scar.  Neurologic: Denied neurology symptoms, dizziness, headache, neck pain and syncope.  Psychiatric: Denied psychiatric symptoms, anxiety and depression.  Endocrine: Denied endocrine symptoms including hot flashes and night sweats.   OB History  Gravida Para Term Preterm AB Living  3 1 1     1   SAB TAB Ectopic Multiple Live Births          1    # Outcome Date GA Lbr Len/2nd Weight Sex Delivery Anes PTL Lv  3 Gravida           2 Term 2014 [redacted]w[redacted]d  7 lb 4.8 oz (3.311 kg) F Vag-Spont   LIV  1 Gravida             Past Medical History:  Diagnosis Date  . 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 Depression   . Endometriosis   . Ovarian cyst     Past Surgical History:  Procedure Laterality Date  . CHOLECYSTECTOMY    . LAPAROSCOPIC OVARIAN CYSTECTOMY Left 01/22/2016  Procedure: LAPAROSCOPIC LEFT OVARIAN CYSTECTOMY-POSSIBLE BIOPSIES;  Surgeon: Herold Harms, MD;  Location: [facility];  Service: Gynecology;  Laterality: Left;  excision/fulgeration of endometriosis  . NO PAST SURGERIES        SOCIAL HISTORY:  Social History   Tobacco Use  Smoking Status Never Smoker  Smokeless Tobacco Never Used   Social History   Substance and Sexual Activity  Alcohol Use  No  . Alcohol/week: 0.0 standard drinks    Social History   Substance and Sexual Activity  Drug Use No    Family History  Problem Relation Age of Onset  . Heart disease Father   . Depression Mother   . Cancer Neg Hx   . Ovarian cancer Neg Hx   . Colon cancer Neg Hx   . Diabetes Neg Hx     ALLERGIES:  Augmentin [amoxicillin-pot clavulanate] and Metoclopramide  MEDS:   Current Outpatient Medications on File Prior to Visit  Medication Sig Dispense Refill  . levonorgestrel (MIRENA) 20 MCG/24HR IUD 1 each by Intrauterine route once.    . sertraline (ZOLOFT) 50 MG tablet TAKE 1 TABLET BY MOUTH EVERY DAY 30 tablet 4   No current facility-administered medications on file prior to visit.    Meds ordered this encounter  Medications  . metroNIDAZOLE (FLAGYL) 500 MG tablet    Sig: Take 1 tablet (500 mg total) by mouth 2 (two) times daily. Begin 5 days prior to scheduled surgery as directed.    Dispense:  10 tablet    Refill:  0     Physical examination BP 110/80   Pulse 82   Ht 5\' 4"  (1.626 m)   Wt 212 lb 4.8 oz (96.3 kg)   LMP 02/02/2020   BMI 36.44 kg/m   General NAD, Conversant  HEENT Atraumatic; Op clear with mmm.  Normo-cephalic. Pupils reactive. Anicteric sclerae  Thyroid/Neck Smooth without nodularity or enlargement. Normal ROM.  Neck Supple.  Skin No rashes, lesions or ulceration. Normal palpated skin turgor. No nodularity.  Breasts: No masses or discharge.  Symmetric.  No axillary adenopathy.  Lungs: Clear to auscultation.No rales or wheezes. Normal Respiratory effort, no retractions.  Heart: NSR.  No murmurs or rubs appreciated. No periferal edema  Abdomen: Soft.  Non-tender.  No masses.  No HSM. No hernia  Extremities: Moves all appropriately.  Normal ROM for age. No lymphadenopathy.  Neuro: Oriented to PPT.  Normal mood. Normal affect.     Pelvic:   Vulva: Normal appearance.  No lesions.  Vagina: No lesions or abnormalities noted.  Support:   Second-degree midline cystocele  Urethra No masses tenderness or scarring.  Meatus Normal size without lesions or prolapse.  Cervix: Normal ectropion.  No lesions.  Anus: Normal exam.  No lesions.  Perineum: Normal exam.  No lesions.        Bimanual   Uterus: Normal size.  Non-tender.  Mobile.  AV.  Adnexae: No masses.  Non-tender to palpation.  Cul-de-sac: Negative for abnormality.   Assessment:   G3P1001 Patient Active Problem List   Diagnosis Date Noted  . History of ovarian cyst 02/15/2020  . Gallstones 01/12/2018  . Family history of cardiac disorder 01/08/2018  . RUQ pain 01/08/2018  . 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 Syncope 01/06/2018  . BMI 33.0-33.9,adult 07/09/2017  . Hx of migraine headaches 07/09/2017  . Endometriosis determined by laparoscopy 01/30/2016  . Pelvic pain 01/17/2016    1. Preop examination   2. Chronic pelvic pain in female  3. Endometriosis   4. History of congenital or genetic condition   5. Cystocele, midline     Patient desires hysterectomy because of pelvic pain, genetic heart condition, and history of endometriosis.  She has declined other options and has unsuccessfully tried hormonal and nonhormonal methods to alleviate her pain.   Plan:   Orders: Meds ordered this encounter  Medications  . metroNIDAZOLE (FLAGYL) 500 MG tablet    Sig: Take 1 tablet (500 mg total) by mouth 2 (two) times daily. Begin 5 days prior to scheduled surgery as directed.    Dispense:  10 tablet    Refill:  0     1.  LAVH BS Possible anterior repair

## 2020-03-06 ENCOUNTER — Other Ambulatory Visit: Payer: BC Managed Care – PPO

## 2020-03-07 ENCOUNTER — Encounter: Payer: BC Managed Care – PPO | Admitting: Obstetrics and Gynecology

## 2020-03-09 ENCOUNTER — Telehealth: Payer: Self-pay

## 2020-03-09 NOTE — Telephone Encounter (Signed)
Patient called in stating that she had a couple questions regarding her surgery coming up. Patient takes Zoloft every night before bed and was wanting to know if she should stop taking it or if it was okay to continue taking. And lastly patient is unsure of the time she needs to be at the hospital for her surgery. Informed patient I would send a message to you so that you could better assist her. Verified call back number with patient.   Could you please advise?

## 2020-03-09 NOTE — Telephone Encounter (Signed)
Pt aware she can continue with Zoloft. She will call pre admit  tomorrow to get her arrival time.   Pt appreciative of call.

## 2020-03-10 ENCOUNTER — Other Ambulatory Visit
Admission: RE | Admit: 2020-03-10 | Discharge: 2020-03-10 | Disposition: A | Discharge status: Home / Self Care | Payer: BC Managed Care – PPO | Source: Ambulatory Visit | Attending: Obstetrics and Gynecology | Admitting: Obstetrics and Gynecology

## 2020-03-10 ENCOUNTER — Encounter
Admission: RE | Admit: 2020-03-10 | Discharge: 2020-03-10 | Disposition: A | Discharge status: Home / Self Care | Payer: BC Managed Care – PPO | Source: Ambulatory Visit | Attending: Obstetrics and Gynecology | Admitting: Obstetrics and Gynecology

## 2020-03-10 ENCOUNTER — Other Ambulatory Visit: Payer: Self-pay

## 2020-03-10 DIAGNOSIS — Z01812 Encounter for preprocedural laboratory examination: Secondary | ICD-10-CM | POA: Diagnosis present

## 2020-03-10 DIAGNOSIS — Z20822 Contact with and (suspected) exposure to covid-19: Secondary | ICD-10-CM | POA: Diagnosis not present

## 2020-03-10 HISTORY — DX: Gastro-esophageal reflux disease without esophagitis: K21.9

## 2020-03-10 LAB — TYPE AND SCREEN
ABO/RH(D): A POS
Antibody Screen: NEGATIVE

## 2020-03-10 LAB — CBC
HCT: 43.2 % (ref 36.0–46.0)
Hemoglobin: 14.2 g/dL (ref 12.0–15.0)
MCH: 28.7 pg (ref 26.0–34.0)
MCHC: 32.9 g/dL (ref 30.0–36.0)
MCV: 87.4 fL (ref 80.0–100.0)
Platelets: 334 10*3/uL (ref 150–400)
RBC: 4.94 MIL/uL (ref 3.87–5.11)
RDW: 13 % (ref 11.5–15.5)
WBC: 10 10*3/uL (ref 4.0–10.5)
nRBC: 0 % (ref 0.0–0.2)

## 2020-03-10 NOTE — Patient Instructions (Signed)
Your procedure is scheduled on: Monday 03-13-2020 MONDAY  Report to the Registration Desk on the 1st floor of the [facility]. To find out your arrival time, please call [phone] between 1PM - 3PM on: 03-10-2020 FRIDAY  REMEMBER: Instructions that are not followed completely may result in serious medical risk, up to and including death; or upon the discretion of your surgeon and anesthesiologist your surgery may need to be rescheduled.  Do not eat food after midnight the night before surgery.  No gum chewing, lozengers or hard candies.  You may however, drink CLEAR liquids up to 2 hours before you are scheduled to arrive for your surgery. Do not drink anything within 2 hours of your scheduled arrival time.  Clear liquids include: - water  - apple juice without pulp - gatorade (not RED, PURPLE, OR BLUE) - black coffee or tea (Do NOT add milk or creamers to the coffee or tea) Do NOT drink anything that is not on this list.  Type 1 and Type 2 diabetics should only drink water.  In addition, your doctor has ordered for you to drink the provided  Ensure Pre-Surgery Clear Carbohydrate Drink  Drinking this carbohydrate drink up to two hours before surgery helps to reduce insulin resistance and improve patient outcomes. Please complete drinking 2 hours prior to scheduled arrival time.  TAKE THESE MEDICATIONS THE MORNING OF SURGERY WITH A SIP OF WATER: FAMOTIDINE (take one the night before and one on the morning of surgery - helps to prevent nausea after surgery.)  One week prior to surgery: Stop Anti-inflammatories (NSAIDS) such as Advil, Aleve, Ibuprofen, Motrin, Naproxen, Naprosyn and ASPIRIN OR Aspirin based products such as Excedrin, Goodys Powder, BC Powder. Stop ANY OVER THE COUNTER supplements until after surgery. (However, you may continue taking Vitamin D, Vitamin B, and multivitamin up until the day before surgery.)  No Alcohol for 24 hours before or after surgery.  No  Smoking including e-cigarettes for 24 hours prior to surgery.  No chewable tobacco products for at least 6 hours prior to surgery.  No nicotine patches on the day of surgery.  Do not use any "recreational" drugs for at least a week prior to your surgery.  Please be advised that the combination of cocaine and anesthesia may have negative outcomes, up to and including death. If you test positive for cocaine, your surgery will be cancelled.  On the morning of surgery brush your teeth with toothpaste and water, you may rinse your mouth with mouthwash if you wish. Do not swallow any toothpaste or mouthwash.  Do not wear jewelry, make-up, hairpins, clips or nail polish.  Do not wear lotions, powders, or perfumes.   Do not shave body from the neck down 48 hours prior to surgery just in case you cut yourself which could leave a site for infection.  Also, freshly shaved skin may become irritated if using the CHG soap.  Contact lenses, hearing aids and dentures may not be worn into surgery.  Do not bring valuables to the hospital. [facility] is not responsible for any missing/lost belongings or valuables.   Use CHG Soap as directed on instruction sheet.  Notify your doctor if there is any change in your medical condition (cold, fever, infection).  Wear comfortable clothing (specific to your surgery type) to the hospital.  Plan for stool softeners for home use; pain medications have a tendency to cause constipation. You can also help prevent constipation by eating foods high in fiber such as  fruits and vegetables and drinking plenty of fluids as your diet allows.  After surgery, you can help prevent lung complications by doing breathing exercises.  Take deep breaths and cough every 1-2 hours. Your doctor may order a device called an Incentive Spirometer to help you take deep breaths. When coughing or sneezing, hold a pillow firmly against your incision with both hands. This is called  "splinting." Doing this helps protect your incision. It also decreases belly discomfort.  If you are being admitted to the hospital overnight, YOU MAY BRING A SMALL WITH YOU.  If you are being discharged the day of surgery, you will not be allowed to drive home. You will need a responsible adult (18 years or older) to drive you home and stay with you that night.   If you are taking public transportation, you will need to have a responsible adult (18 years or older) with you. Please confirm with your physician that it is acceptable to use public transportation.   Please call the Pre-admissions Testing Dept. at [phone] if you have any questions about these instructions.  Visitation Policy:  Patients undergoing a surgery or procedure may have one family member or support person with them as long as that person is not COVID-19 positive or experiencing its symptoms.  That person may remain in the waiting area during the procedure.  Inpatient Visitation Update:   In an effort to ensure the safety of our team members and our patients, we are implementing a change to our visitation policy:  Effective Monday, Aug. 9, at 7 a.m., inpatients will be allowed one support person.  o The support person may change daily.  o The support person must pass our screening, gel in and out, and wear a mask at all times, including in the patient's room.  o Patients must also wear a mask when staff or their support person are in the room.  o Masking is required regardless of vaccination status.  Systemwide, no visitors 17 or younger.

## 2020-03-11 LAB — SARS CORONAVIRUS 2 (TAT 6-24 HRS): SARS Coronavirus 2: NEGATIVE

## 2020-03-13 ENCOUNTER — Ambulatory Visit: Payer: BC Managed Care – PPO | Admitting: Anesthesiology

## 2020-03-13 ENCOUNTER — Observation Stay
Admission: RE | Admit: 2020-03-13 | Discharge: 2020-03-14 | Disposition: A | Discharge status: Home / Self Care | Payer: BC Managed Care – PPO | Attending: Obstetrics and Gynecology | Admitting: Obstetrics and Gynecology

## 2020-03-13 ENCOUNTER — Encounter
Admission: RE | Disposition: A | Discharge status: Home / Self Care | Payer: Self-pay | Source: Home / Self Care | Attending: Obstetrics and Gynecology

## 2020-03-13 ENCOUNTER — Other Ambulatory Visit: Payer: Self-pay

## 2020-03-13 ENCOUNTER — Encounter: Payer: Self-pay | Admitting: Obstetrics and Gynecology

## 2020-03-13 DIAGNOSIS — Z9889 Other specified postprocedural states: Secondary | ICD-10-CM

## 2020-03-13 DIAGNOSIS — R102 Pelvic and perineal pain: Secondary | ICD-10-CM | POA: Diagnosis not present

## 2020-03-13 DIAGNOSIS — N819 Female genital prolapse, unspecified: Secondary | ICD-10-CM | POA: Insufficient documentation

## 2020-03-13 DIAGNOSIS — Z8742 Personal history of other diseases of the female genital tract: Secondary | ICD-10-CM | POA: Insufficient documentation

## 2020-03-13 DIAGNOSIS — N8111 Cystocele, midline: Secondary | ICD-10-CM | POA: Diagnosis not present

## 2020-03-13 HISTORY — PX: LAPAROSCOPIC ASSISTED VAGINAL HYSTERECTOMY: SHX5398

## 2020-03-13 HISTORY — PX: LAPAROSCOPIC BILATERAL SALPINGECTOMY: SHX5889

## 2020-03-13 HISTORY — PX: CYSTOCELE REPAIR: SHX163

## 2020-03-13 SURGERY — HYSTERECTOMY, VAGINAL, LAPAROSCOPY-ASSISTED
Anesthesia: General

## 2020-03-13 MED ORDER — ROCURONIUM BROMIDE 100 MG/10ML IV SOLN
INTRAVENOUS | Status: DC | PRN
  Administered 2020-03-13 (×2): 40 mg via INTRAVENOUS

## 2020-03-13 MED ORDER — PROMETHAZINE HCL 25 MG/ML IJ SOLN: 6.2500 mg | INTRAMUSCULAR | Status: DC | PRN

## 2020-03-13 MED ORDER — PROPOFOL 500 MG/50ML IV EMUL
INTRAVENOUS | Status: DC | PRN
  Administered 2020-03-13: 111 ug/kg/min via INTRAVENOUS

## 2020-03-13 MED ORDER — ROCURONIUM BROMIDE 10 MG/ML (PF) SYRINGE
PREFILLED_SYRINGE | INTRAVENOUS | Status: AC
  Filled 2020-03-13: qty 10

## 2020-03-13 MED ORDER — POVIDONE-IODINE 10 % EX SWAB: 2.0000 "application " | Freq: Once | CUTANEOUS | Status: DC

## 2020-03-13 MED ORDER — OXYCODONE HCL 5 MG/5ML PO SOLN
5.0000 mg | Freq: Once | ORAL | Status: DC | PRN
Start: 2020-03-13 — End: 2020-03-13

## 2020-03-13 MED ORDER — BUPIVACAINE HCL 0.5 % IJ SOLN
INTRAMUSCULAR | Status: DC | PRN
  Administered 2020-03-13: 15 mL

## 2020-03-13 MED ORDER — CEFAZOLIN SODIUM-DEXTROSE 2-4 GM/100ML-% IV SOLN
INTRAVENOUS | Status: AC
  Filled 2020-03-13: qty 100

## 2020-03-13 MED ORDER — ONDANSETRON HCL 4 MG/2ML IJ SOLN
INTRAMUSCULAR | Status: DC | PRN
  Administered 2020-03-13: 4 mg via INTRAVENOUS

## 2020-03-13 MED ORDER — MIDAZOLAM HCL 2 MG/2ML IJ SOLN
INTRAMUSCULAR | Status: DC | PRN
  Administered 2020-03-13: 2 mg via INTRAVENOUS

## 2020-03-13 MED ORDER — PHENYLEPHRINE HCL (PRESSORS) 10 MG/ML IV SOLN
INTRAVENOUS | Status: DC | PRN
  Administered 2020-03-13: 100 ug via INTRAVENOUS

## 2020-03-13 MED ORDER — ONDANSETRON HCL 4 MG/2ML IJ SOLN: 4.0000 mg | Freq: Four times a day (QID) | INTRAMUSCULAR | Status: DC | PRN

## 2020-03-13 MED ORDER — DEXTROSE IN LACTATED RINGERS 5 % IV SOLN: INTRAVENOUS | Status: DC

## 2020-03-13 MED ORDER — VASOPRESSIN 20 UNIT/ML IV SOLN
INTRAVENOUS | Status: DC | PRN
  Administered 2020-03-13: 7 mL via INTRAMUSCULAR
  Administered 2020-03-13: 16 mL via INTRAMUSCULAR

## 2020-03-13 MED ORDER — PROPOFOL 500 MG/50ML IV EMUL
INTRAVENOUS | Status: AC
  Filled 2020-03-13: qty 50

## 2020-03-13 MED ORDER — DEXMEDETOMIDINE (PRECEDEX) IN NS 20 MCG/5ML (4 MCG/ML) IV SYRINGE
PREFILLED_SYRINGE | INTRAVENOUS | Status: AC
  Filled 2020-03-13: qty 5

## 2020-03-13 MED ORDER — LACTATED RINGERS IV SOLN: INTRAVENOUS | Status: DC

## 2020-03-13 MED ORDER — PROPOFOL 10 MG/ML IV BOLUS
INTRAVENOUS | Status: DC | PRN
  Administered 2020-03-13: 20 mg via INTRAVENOUS
  Administered 2020-03-13: 180 mg via INTRAVENOUS

## 2020-03-13 MED ORDER — FENTANYL CITRATE (PF) 100 MCG/2ML IJ SOLN
INTRAMUSCULAR | Status: AC
  Filled 2020-03-13: qty 2

## 2020-03-13 MED ORDER — OXYCODONE-ACETAMINOPHEN 5-325 MG PO TABS
1.0000 | ORAL_TABLET | ORAL | Status: DC | PRN
  Administered 2020-03-13 – 2020-03-14 (×6): 2 via ORAL
  Filled 2020-03-13 (×6): qty 2

## 2020-03-13 MED ORDER — FENTANYL CITRATE (PF) 100 MCG/2ML IJ SOLN
25.0000 ug | INTRAMUSCULAR | Status: DC | PRN
  Administered 2020-03-13 (×3): 25 ug via INTRAVENOUS

## 2020-03-13 MED ORDER — BUPIVACAINE HCL (PF) 0.5 % IJ SOLN
INTRAMUSCULAR | Status: AC
  Filled 2020-03-13: qty 30

## 2020-03-13 MED ORDER — FENTANYL CITRATE (PF) 250 MCG/5ML IJ SOLN
INTRAMUSCULAR | Status: AC
  Filled 2020-03-13: qty 5

## 2020-03-13 MED ORDER — KETOROLAC TROMETHAMINE 30 MG/ML IJ SOLN
INTRAMUSCULAR | Status: DC | PRN
  Administered 2020-03-13: 30 mg via INTRAVENOUS

## 2020-03-13 MED ORDER — FAMOTIDINE 20 MG PO TABS
20.0000 mg | ORAL_TABLET | Freq: Every day | ORAL | Status: DC
  Administered 2020-03-13 – 2020-03-14 (×2): 20 mg via ORAL
  Filled 2020-03-13 (×2): qty 1

## 2020-03-13 MED ORDER — DIPHENHYDRAMINE HCL 50 MG/ML IJ SOLN
INTRAMUSCULAR | Status: DC | PRN
  Administered 2020-03-13: 25 mg via INTRAVENOUS

## 2020-03-13 MED ORDER — SCOPOLAMINE 1 MG/3DAYS TD PT72
MEDICATED_PATCH | TRANSDERMAL | Status: AC
  Filled 2020-03-13: qty 1

## 2020-03-13 MED ORDER — DEXAMETHASONE SODIUM PHOSPHATE 10 MG/ML IJ SOLN
INTRAMUSCULAR | Status: DC | PRN
  Administered 2020-03-13: 10 mg via INTRAVENOUS

## 2020-03-13 MED ORDER — SODIUM CHLORIDE (PF) 0.9 % IJ SOLN
INTRAMUSCULAR | Status: AC
  Filled 2020-03-13: qty 50

## 2020-03-13 MED ORDER — ACETAMINOPHEN 10 MG/ML IV SOLN
INTRAVENOUS | Status: DC | PRN
  Administered 2020-03-13: 1000 mg via INTRAVENOUS

## 2020-03-13 MED ORDER — MIDAZOLAM HCL 2 MG/2ML IJ SOLN
INTRAMUSCULAR | Status: AC
  Filled 2020-03-13: qty 2

## 2020-03-13 MED ORDER — LACTATED RINGERS IV SOLN: INTRAVENOUS | Status: DC | PRN

## 2020-03-13 MED ORDER — SUGAMMADEX SODIUM 200 MG/2ML IV SOLN
INTRAVENOUS | Status: DC | PRN
  Administered 2020-03-13: 200 mg via INTRAVENOUS

## 2020-03-13 MED ORDER — ORAL CARE MOUTH RINSE: 15.0000 mL | Freq: Once | OROMUCOSAL | Status: AC

## 2020-03-13 MED ORDER — DEXMEDETOMIDINE HCL IN NACL 400 MCG/100ML IV SOLN
INTRAVENOUS | Status: DC | PRN
  Administered 2020-03-13 (×2): 20 ug via INTRAVENOUS

## 2020-03-13 MED ORDER — OXYBUTYNIN CHLORIDE 5 MG PO TABS
5.0000 mg | ORAL_TABLET | Freq: Three times a day (TID) | ORAL | Status: DC | PRN
  Administered 2020-03-13: 5 mg via ORAL
  Filled 2020-03-13 (×3): qty 1

## 2020-03-13 MED ORDER — VASOPRESSIN 20 UNIT/ML IV SOLN
INTRAVENOUS | Status: AC
  Filled 2020-03-13: qty 1

## 2020-03-13 MED ORDER — CHLORHEXIDINE GLUCONATE 0.12 % MT SOLN
OROMUCOSAL | Status: AC
  Filled 2020-03-13: qty 15

## 2020-03-13 MED ORDER — OXYCODONE HCL 5 MG PO TABS: 5.0000 mg | ORAL_TABLET | Freq: Once | ORAL | Status: DC | PRN

## 2020-03-13 MED ORDER — CHLORHEXIDINE GLUCONATE 0.12 % MT SOLN
15.0000 mL | Freq: Once | OROMUCOSAL | Status: AC
  Administered 2020-03-13: 15 mL via OROMUCOSAL

## 2020-03-13 MED ORDER — IBUPROFEN 800 MG PO TABS
800.0000 mg | ORAL_TABLET | Freq: Three times a day (TID) | ORAL | Status: DC
  Administered 2020-03-14 (×2): 800 mg via ORAL
  Filled 2020-03-13 (×2): qty 1

## 2020-03-13 MED ORDER — KETOROLAC TROMETHAMINE 30 MG/ML IJ SOLN: 30.0000 mg | Freq: Once | INTRAMUSCULAR | Status: DC

## 2020-03-13 MED ORDER — IBUPROFEN 800 MG PO TABS
800.0000 mg | ORAL_TABLET | Freq: Three times a day (TID) | ORAL | Status: DC
  Administered 2020-03-13: 800 mg via ORAL
  Filled 2020-03-13: qty 1

## 2020-03-13 MED ORDER — LIDOCAINE HCL (CARDIAC) PF 100 MG/5ML IV SOSY
PREFILLED_SYRINGE | INTRAVENOUS | Status: DC | PRN
  Administered 2020-03-13: 80 mg via INTRAVENOUS

## 2020-03-13 MED ORDER — DEXAMETHASONE SODIUM PHOSPHATE 10 MG/ML IJ SOLN
INTRAMUSCULAR | Status: AC
  Filled 2020-03-13: qty 1

## 2020-03-13 MED ORDER — LIDOCAINE HCL (PF) 2 % IJ SOLN
INTRAMUSCULAR | Status: AC
  Filled 2020-03-13: qty 5

## 2020-03-13 MED ORDER — FENTANYL CITRATE (PF) 100 MCG/2ML IJ SOLN
INTRAMUSCULAR | Status: DC | PRN
  Administered 2020-03-13 (×3): 50 ug via INTRAVENOUS
  Administered 2020-03-13: 100 ug via INTRAVENOUS

## 2020-03-13 MED ORDER — ACETAMINOPHEN 10 MG/ML IV SOLN
INTRAVENOUS | Status: AC
  Filled 2020-03-13: qty 100

## 2020-03-13 MED ORDER — SIMETHICONE 80 MG PO CHEW
80.0000 mg | CHEWABLE_TABLET | Freq: Four times a day (QID) | ORAL | Status: DC | PRN
  Administered 2020-03-13: 80 mg via ORAL
  Filled 2020-03-13: qty 1

## 2020-03-13 MED ORDER — CEFAZOLIN SODIUM-DEXTROSE 2-4 GM/100ML-% IV SOLN
2.0000 g | INTRAVENOUS | Status: AC
  Administered 2020-03-13: 2 g via INTRAVENOUS

## 2020-03-13 MED ORDER — SCOPOLAMINE 1 MG/3DAYS TD PT72
MEDICATED_PATCH | TRANSDERMAL | Status: DC | PRN
  Administered 2020-03-13: 1 via TRANSDERMAL

## 2020-03-13 SURGICAL SUPPLY — 46 items
ADHESIVE MASTISOL STRL (MISCELLANEOUS) ×3 IMPLANT
BAG URINE DRAIN 2000ML AR STRL (UROLOGICAL SUPPLIES) ×3 IMPLANT
BLADE SURG SZ11 CARB STEEL (BLADE) ×3 IMPLANT
CATH FOLEY 2WAY  5CC 16FR (CATHETERS) ×1
CATH URTH 16FR FL 2W BLN LF (CATHETERS) ×2 IMPLANT
CHLORAPREP W/TINT 26 (MISCELLANEOUS) ×3 IMPLANT
COVER WAND RF STERILE (DRAPES) IMPLANT
DEFOGGER SCOPE WARMER CLEARIFY (MISCELLANEOUS) ×3 IMPLANT
DERMABOND ADVANCED (GAUZE/BANDAGES/DRESSINGS) ×1
DERMABOND ADVANCED .7 DNX12 (GAUZE/BANDAGES/DRESSINGS) ×2 IMPLANT
ELECT REM PT RETURN 9FT ADLT (ELECTROSURGICAL) ×3
ELECTRODE REM PT RTRN 9FT ADLT (ELECTROSURGICAL) ×2 IMPLANT
GAUZE 4X4 16PLY RFD (DISPOSABLE) ×3 IMPLANT
GLOVE BIO SURGEON STRL SZ8 (GLOVE) ×21 IMPLANT
GLOVE BIOGEL PI ORTHO PRO 7.5 (GLOVE) ×7
GLOVE PI ORTHO PRO STRL 7.5 (GLOVE) ×14 IMPLANT
GOWN STRL REUS W/ TWL LRG LVL3 (GOWN DISPOSABLE) ×10 IMPLANT
GOWN STRL REUS W/ TWL XL LVL3 (GOWN DISPOSABLE) ×4 IMPLANT
GOWN STRL REUS W/TWL LRG LVL3 (GOWN DISPOSABLE) ×5
GOWN STRL REUS W/TWL XL LVL3 (GOWN DISPOSABLE) ×2
HANDLE YANKAUER SUCT BULB TIP (MISCELLANEOUS) ×3 IMPLANT
IRRIGATION STRYKERFLOW (MISCELLANEOUS) IMPLANT
IRRIGATOR STRYKERFLOW (MISCELLANEOUS)
IV LACTATED RINGERS 1000ML (IV SOLUTION) IMPLANT
KIT PINK PAD W/HEAD ARE REST (MISCELLANEOUS) ×3
KIT PINK PAD W/HEAD ARM REST (MISCELLANEOUS) ×2 IMPLANT
KIT TURNOVER CYSTO (KITS) ×3 IMPLANT
LIGASURE LAP MARYLAND 5MM 37CM (ELECTROSURGICAL) ×3 IMPLANT
MANIFOLD NEPTUNE II (INSTRUMENTS) ×3 IMPLANT
PACK BASIN MINOR (MISCELLANEOUS) ×3 IMPLANT
PACK GYN LAPAROSCOPIC (MISCELLANEOUS) ×3 IMPLANT
PAD OB MATERNITY 4.3X12.25 (PERSONAL CARE ITEMS) ×3 IMPLANT
PAD PREP 24X41 OB/GYN DISP (PERSONAL CARE ITEMS) ×3 IMPLANT
RETRACTOR PHONTONGUIDE ADAPT (ADAPTER) IMPLANT
SLEEVE ENDOPATH XCEL 5M (ENDOMECHANICALS) ×6 IMPLANT
SOLUTION ELECTROLUBE (MISCELLANEOUS) ×3 IMPLANT
SPONGE LAP 18X18 RF (DISPOSABLE) ×3 IMPLANT
STRIP CLOSURE SKIN 1/2X4 (GAUZE/BANDAGES/DRESSINGS) ×3 IMPLANT
SUT VIC AB 0 CT1 27 (SUTURE) ×2
SUT VIC AB 0 CT1 27XCR 8 STRN (SUTURE) ×4 IMPLANT
SUT VIC AB 0 CT1 36 (SUTURE) ×6 IMPLANT
SUT VIC AB 4-0 FS2 27 (SUTURE) ×3 IMPLANT
SYR 10ML LL (SYRINGE) ×3 IMPLANT
TAPE TRANSPORE STRL 2 31045 (GAUZE/BANDAGES/DRESSINGS) ×3 IMPLANT
TROCAR XCEL NON-BLD 5MMX100MML (ENDOMECHANICALS) ×3 IMPLANT
TUBING EVAC SMOKE HEATED PNEUM (TUBING) ×3 IMPLANT

## 2020-03-13 NOTE — OR Nursing (Signed)
IUD explanted. Discarded per physician direction.

## 2020-03-13 NOTE — Progress Notes (Signed)
preg test negative in pre-op. Glucometer issues with transferring results into epic

## 2020-03-13 NOTE — Anesthesia Procedure Notes (Signed)
Procedure Name: Intubation Date/Time: 03/13/2020 11:27 AM Performed by: Gaynelle Cage, CRNA Pre-anesthesia Checklist: Patient identified, Emergency Drugs available, Suction available, Patient being monitored and Timeout performed Patient Re-evaluated:Patient Re-evaluated prior to induction Oxygen Delivery Method: Circle system utilized Preoxygenation: Pre-oxygenation with 100% oxygen Induction Type: IV induction Ventilation: Mask ventilation without difficulty and Oral airway inserted - appropriate to patient size Laryngoscope Size: Mac and 3 Grade View: Grade II Tube type: Oral Tube size: 7.0 mm Number of attempts: 1 Airway Equipment and Method: Stylet and Oral airway Placement Confirmation: ETT inserted through vocal cords under direct vision,  positive ETCO2 and breath sounds checked- equal and bilateral Secured at: 22 cm Dental Injury: Teeth and Oropharynx as per pre-operative assessment

## 2020-03-13 NOTE — Op Note (Signed)
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  She provided exposure, dissection, suctioning, retraction, and general support and assistance during the procedure.   ANESTHESIA: General  ESTIMATED BLOOD LOSS: 350 mL  OPERATIVE FINDINGS: Small amount of E-osis located in the cul-de-sac.  SPECIMEN:  ID Type Source Tests Collected by Time Destination  1 : Uterus, cervix, BILATERAL fallopian tubes Tissue PATH Gyn benign resection SURGICAL PATHOLOGY Linzie Collin, MD 03/13/2020 1257     COMPLICATIONS: None  DRAINS: Foley to gravity  DISPOSITION: Stable to recovery room  DESCRIPTION OF PROCEDURE:      The patient was prepped and draped in the dorsolithotomy position and placed under general anesthesia. The bladder was emptied. The cervix was grasped with a multi-toothed tenaculum and a uterine manipulator was placed within the cervical os respecting the position and curvature of the uterus. After changing gloves we proceeded abdominally. A small infraumbilical incision was made and a 5 mm trocar port was placed within the abdominopelvic cavity. The opening pressure was less than 7 mmHg.  Approximately 3 and 1/2 L of carbon dioxide gas was instilled within the abdominal pelvic cavity. The laparoscope was placed and the pelvis and abdomen were carefully inspected. In the usual manner, under direct visualization right and left lower quadrant ports of 5 mm size were placed.  Both ureters were identified in the pelvis prior to dissection or clamping and cutting of pedicles. The fallopian tubes were elevated and the mesenteric side systematically coagulated and divided allowing the tube to be removed at the time of uterine removal. The round ligaments were coagulated and divided and a bladder flap was created. The upper aspect of the broad ligament was clamped coagulated and divided. The uterine arteries were skeletonized, triply coagulated and divided. Careful inspection of all pedicles and the remainder of the pelvis was performed. Hemostasis was noted. The lower quadrant ports were removed, hemostasis of the port sites was noted, and the incisions were closed in subcuticular manner. The laparoscope and trocar sleeve were removed from the infraumbilical incision, hemostasis was noted, and the incision was closed in a subcuticular manner. A long-acting anesthetic was employed in the skin incisions. We then proceeded vaginally. A weighted speculum was placed posteriorly. A multi-toothed tenaculum was used to grasp the cervix and the cervix was injected in a circumferential manner with a dilute Pitressin solution. An incision was made around the cervix and the vaginal mucosa was dissected off of the cervix. The posterior cul-de-sac was identified and entered and the weighted speculum was placed within this. The anterior cul-de-sac was identified and entered and a retractor was placed and used to retract the bladder anteriorly keeping it out of the operative field. The uterosacral ligaments were clamped divided and suture ligated. The cardinal ligaments were clamped divided and suture ligated. The small remaining pedicle was clamped divided and suture ligated bilaterally allowing delivery of the specimen. Angle sutures were placed in the usual manner. A culdoplasty was performed. The peritoneum was identified anteriorly and then incorporating the left upper  pedicle left lower pedicle  right lower pedicle right upper pedicle and anterior peritoneum a pursestring suture was placed exteriorizing all pedicles. Hemostasis of all pedicles was noted at this time. The vaginal mucosa beginning at the vaginal cuff and overlying the bladder, was grasped with Allis clamps and injected with a dilute Pitressin solution in the midline. A midline incision was made to the level of the urethra. The vaginal mucosa was dissected laterally from the underlying attenuated fascia. A typical Kelly plication was performed carefully covering the tension-free vaginal tape with thickened fascia. The bladder was plicated several sutures of 3-0 Vicryl.  A shelf of fascia was then approximated in the midline placing the bladder back in its more anatomic position.The excess vaginal mucosa was trimmed. Vaginal mucosa was then closed in the midline with interrupted sutures to the level of the vaginal cuff. The vaginal cuff was closed with Vicryl suture. Hemostasis was noted. The patient went to recovery room in stable condition. Clear urine was noted in the Foley at the conclusion of the procedure.  Elonda Husky, M.D. 03/13/2020 2:05 PM

## 2020-03-13 NOTE — Transfer of Care (Signed)
Immediate Anesthesia Transfer of Care Note  Patient: Stephanie Hampton  Procedure(s) Performed: LAPAROSCOPIC ASSISTED VAGINAL HYSTERECTOMY (N/A ) LAPAROSCOPIC BILATERAL SALPINGECTOMY (Bilateral ) ANTERIOR REPAIR (CYSTOCELE) (N/A )  Patient Location: PACU  Anesthesia Type:General  Level of Consciousness: sedated  Airway & Oxygen Therapy: Patient Spontanous Breathing and Patient connected to face mask oxygen  Post-op Assessment: Report given to RN and Post -op Vital signs reviewed and stable  Post vital signs: Reviewed and stable  Last Vitals:  Vitals Value Taken Time  BP 93/41 03/13/20 1407  Temp    Pulse 71 03/13/20 1410  Resp 22 03/13/20 1410  SpO2 99 % 03/13/20 1410  Vitals shown include unvalidated device data.  Last Pain:  Vitals:   03/13/20 0943  TempSrc: Oral  PainSc: 0-No pain         Complications: No complications documented.

## 2020-03-13 NOTE — Interval H&P Note (Signed)
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  The various methods of treatment have been discussed with the patient and family. After consideration of risks, benefits and other options for treatment, the patient has consented to  Procedure(s): LAPAROSCOPIC ASSISTED VAGINAL HYSTERECTOMY (N/A) LAPAROSCOPIC BILATERAL SALPINGECTOMY (Bilateral) ANTERIOR REPAIR (CYSTOCELE) (N/A) as a surgical intervention.  The patient's history has been reviewed, patient examined, no change in status, stable for surgery.  I have reviewed the patient's chart and labs.  Questions were answered to the patient's satisfaction.     Brennan Bailey

## 2020-03-13 NOTE — Anesthesia Preprocedure Evaluation (Addendum)
Anesthesia Evaluation  Patient identified by MRN, date of birth, ID band Patient awake    Reviewed: Allergy & Precautions, H&P , NPO status , Patient's Chart, lab work & pertinent test results  History of Anesthesia Complications Negative for: history of anesthetic complications  Airway Mallampati: II  TM Distance: >3 FB Neck ROM: full    Dental  (+) Teeth Intact   Pulmonary neg pulmonary ROS, neg sleep apnea, neg COPD,    breath sounds clear to auscultation       Cardiovascular (-) angina(-) Past MI and (-) Cardiac Stents (-) dysrhythmias  Rhythm:regular Rate:Normal  Loop recorder in place (no hx arrhythmias)   Neuro/Psych PSYCHIATRIC DISORDERS Depression  Neuromuscular disease (myofibrillar myopathy (a form of MUSCULAR DYSTROPHY) - genetic test positive (tested due to affected family members), but no symptoms)    GI/Hepatic Neg liver ROS, GERD  Controlled,  Endo/Other  negative endocrine ROS  Renal/GU      Musculoskeletal   Abdominal   Peds  Hematology negative hematology ROS (+)   Anesthesia Other Findings Obesity  Past Medical History: No date: 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 No date: Depression No date: Endometriosis No date: GERD (gastroesophageal reflux disease) No date: Ovarian cyst  Past Surgical History: No date: CHOLECYSTECTOMY 01/22/2016: LAPAROSCOPIC OVARIAN CYSTECTOMY; Left     Comment:  Procedure: LAPAROSCOPIC LEFT OVARIAN CYSTECTOMY-POSSIBLE              BIOPSIES;  Surgeon: Herold Harms, MD;  Location:               [facility] ORS;  Service: Gynecology;  Laterality: Left;                excision/fulgeration of endometriosis No date: LOOP RECORDER IMPLANT No date: NO PAST SURGERIES     Reproductive/Obstetrics negative OB ROS                            Anesthesia Physical Anesthesia Plan  ASA: III  Anesthesia  Plan: General ETT   Post-op Pain Management:    Induction:   PONV Risk Score and Plan: Ondansetron, Dexamethasone, Midazolam, Treatment may vary due to age or medical condition, TIVA and Propofol infusion  Airway Management Planned:   Additional Equipment:   Intra-op Plan:   Post-operative Plan:   Informed Consent: I have reviewed the patients History and Physical, chart, labs and discussed the procedure including the risks, benefits and alternatives for the proposed anesthesia with the patient or authorized representative who has indicated his/her understanding and acceptance.     Dental Advisory Given  Plan Discussed with: Anesthesiologist, CRNA and Surgeon  Anesthesia Plan Comments:         Anesthesia Quick Evaluation

## 2020-03-14 ENCOUNTER — Encounter: Payer: Self-pay | Admitting: Obstetrics and Gynecology

## 2020-03-14 DIAGNOSIS — N8111 Cystocele, midline: Secondary | ICD-10-CM | POA: Diagnosis not present

## 2020-03-14 MED ORDER — OXYCODONE-ACETAMINOPHEN 5-325 MG PO TABS: 1.0000 | ORAL_TABLET | ORAL | 0 refills | Status: DC | PRN

## 2020-03-14 NOTE — Progress Notes (Signed)
Pt discharged home.  Discharge instructions, prescriptions and follow up appointment given to and reviewed with pt.  Pt verbalized understanding.  Escorted by auxillary. 

## 2020-03-14 NOTE — Discharge Instructions (Signed)

## 2020-03-14 NOTE — Discharge Summary (Signed)
Discharge Summary  Admit date: 03/13/2020  Discharge Date and Time:03/14/2020  2:11 PM  Discharge to:  Home  Admission Diagnosis: Present on Admission: **None**                     Discharge  Diagnoses: Active Problems:   Postoperative state   OR Procedures:   Procedure(s): LAPAROSCOPIC ASSISTED VAGINAL HYSTERECTOMY LAPAROSCOPIC BILATERAL SALPINGECTOMY ANTERIOR REPAIR (CYSTOCELE) Date -------------------                              Discharge Day Progress Note:   Subjective:   The patient does not have complaints.  She is ambulating well. She is taking PO well. Her pain is well controlled with her current medications. She is urinating without difficulty and is passing flatus.   Objective:  BP 96/60 (BP Location: Left Arm)   Pulse 64   Temp 98.8 F (37.1 C) (Oral)   Resp 18   Ht 5\' 4"  (1.626 m)   Wt 97.5 kg   LMP 02/29/2020   SpO2 97%   BMI 36.90 kg/m     Abdomen:                         clean, dry, no drainage    Assessment:   Doing well.  Normal progress as expected.     Plan:        Discharge home.                       Medications as directed.  Hospital Course:  No notes on file   Condition at Discharge:  good Discharge Medications:  Allergies as of 03/14/2020      Reactions   Augmentin [amoxicillin-pot Clavulanate] Hives   Has patient had a PCN reaction causing immediate rash, facial/tongue/throat swelling, SOB or lightheadedness with hypotension: No Has patient had a PCN reaction causing severe rash involving mucus membranes or skin necrosis: No Has patient had a PCN reaction that required hospitalization: No Has patient had a PCN reaction occurring within the last 10 years: Yes If all of the above answers are "NO", then may proceed with Cephalosporin use.   Metoclopramide Anxiety      Medication List    STOP taking these medications   acetaminophen 500 MG tablet Commonly known as: TYLENOL   metroNIDAZOLE 500 MG tablet Commonly  known as: FLAGYL     TAKE these medications   famotidine 20 MG tablet Commonly known as: PEPCID Take 20 mg by mouth 2 (two) times daily as needed for heartburn or indigestion.   levonorgestrel 20 MCG/24HR IUD Commonly known as: MIRENA 1 each by Intrauterine route once.   oxyCODONE-acetaminophen 5-325 MG tablet Commonly known as: PERCOCET/ROXICET Take 1-2 tablets by mouth every 4 (four) hours as needed for moderate pain.   RoC Retinol Correxion Crea Apply 1 application topically every evening.   sertraline 50 MG tablet Commonly known as: ZOLOFT TAKE 1 TABLET BY MOUTH EVERY DAY What changed: when to take this        Follow Up:    Follow-up Information    03/16/2020, MD Follow up on 03/21/2020.   Specialties: Obstetrics and Gynecology, Radiology Why: Post-op follow up visit on 03/21/20 @ 10:45 am. Contact information: [address] [place] [phone]  Elonda Husky, M.D. 03/14/2020 2:11 PM

## 2020-03-15 LAB — SURGICAL PATHOLOGY

## 2020-03-16 ENCOUNTER — Telehealth: Payer: Self-pay

## 2020-03-16 NOTE — Telephone Encounter (Signed)
I have spoke with the patient to let her know that she can use the enema per Dr. Logan Bores. Patient has not taken the pain medication since yesterday day so this will hopefully help as well. Patient will call with any more problems.

## 2020-03-16 NOTE — Telephone Encounter (Signed)
Please check your spelling on this phone message. Medicine is spelled wrong.

## 2020-03-16 NOTE — Telephone Encounter (Addendum)
Pt called in and stated that she is requesting a call back from the nurse . The pt has some questions about a medicine she can take for constipation. The pt wants to know if enema liquid is okay to use ? I told the pt that I will send a message and a nurse will call her with that answer. Please advise

## 2020-03-17 NOTE — Telephone Encounter (Signed)
Yes ma'am, thank you for catching that. I fixed it.

## 2020-03-21 ENCOUNTER — Ambulatory Visit (INDEPENDENT_AMBULATORY_CARE_PROVIDER_SITE_OTHER): Payer: BC Managed Care – PPO | Admitting: Obstetrics and Gynecology

## 2020-03-21 ENCOUNTER — Encounter: Payer: Self-pay | Admitting: Obstetrics and Gynecology

## 2020-03-21 VITALS — BP 118/72 | HR 92 | Ht 64.0 in | Wt 210.8 lb

## 2020-03-21 DIAGNOSIS — Z9889 Other specified postprocedural states: Secondary | ICD-10-CM

## 2020-03-21 DIAGNOSIS — R300 Dysuria: Secondary | ICD-10-CM

## 2020-03-21 NOTE — Progress Notes (Signed)
HPI:      [name] is a 24 y.o. G3P1001 who LMP was Patient's last menstrual period was 02/29/2020.  Subjective:   She presents today approximately week from surgery.  She reports she is doing well having minimal pain.  She does state that she occasionally has a squeezing sensation and difficulty with her urine stream.  She does admit she is not drinking quite enough. She has only had 1 bowel movement since her surgery.  She is no longer taking narcotics.    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 She does not have any pertinent problems on file. She  has a past surgical history that includes No past surgeries; Laparoscopic ovarian cystectomy (Left, 01/22/2016); Cholecystectomy; Loop recorder implant; Laparoscopic assisted vaginal hysterectomy (N/A, 03/13/2020); Laparoscopic bilateral salpingectomy (Bilateral, 03/13/2020); and Cystocele repair (N/A, 03/13/2020). Her family history includes Depression in her mother; Heart disease in her father. She  reports that she has never smoked. She has never used smokeless tobacco. She reports that she does not drink alcohol and does not use drugs. She has a current medication list which includes the following prescription(s): sertraline, roc retinol correxion, famotidine, levonorgestrel, and oxycodone-acetaminophen. She is allergic to augmentin [amoxicillin-pot clavulanate] and metoclopramide.       Review of Systems:  Review of Systems  Constitutional: Denied constitutional symptoms, night sweats, recent illness, fatigue, fever, insomnia and weight loss.  Eyes: Denied eye symptoms, eye pain, photophobia, vision change and visual disturbance.  Ears/Nose/Throat/Neck: Denied ear, nose,  throat or neck symptoms, hearing loss, nasal discharge, sinus congestion and sore throat.  Cardiovascular: Denied cardiovascular symptoms, arrhythmia, chest pain/pressure, edema, exercise intolerance, orthopnea and palpitations.  Respiratory: Denied pulmonary symptoms, asthma, pleuritic pain, productive sputum, cough, dyspnea and wheezing.  Gastrointestinal: Denied, gastro-esophageal reflux, melena, nausea and vomiting.  Genitourinary: Denied genitourinary symptoms including symptomatic vaginal discharge, pelvic relaxation issues, and urinary complaints.  Musculoskeletal: Denied musculoskeletal symptoms, stiffness, swelling, muscle weakness and myalgia.  Dermatologic: Denied dermatology symptoms, rash and scar.  Neurologic: Denied neurology symptoms, dizziness, headache, neck pain and syncope.  Psychiatric: Denied psychiatric symptoms, anxiety and depression.  Endocrine: Denied endocrine symptoms including hot flashes and night sweats.   Meds:   Current Outpatient Medications on File Prior to Visit  Medication Sig Dispense Refill  . sertraline (ZOLOFT) 50 MG tablet TAKE 1 TABLET BY MOUTH EVERY DAY (Patient taking differently: Take 50 mg by mouth every evening.) 30 tablet 4  . Emollient (ROC RETINOL CORREXION) CREA Apply 1 application topically every evening.    . famotidine (PEPCID) 20 MG tablet Take 20 mg by mouth 2 (two) times daily as needed for heartburn or indigestion.    Marland Kitchen levonorgestrel (MIRENA) 20 MCG/24HR IUD 1 each by Intrauterine route once.    Marland Kitchen oxyCODONE-acetaminophen (PERCOCET/ROXICET) 5-325 MG tablet Take 1-2 tablets by mouth every 4 (four) hours as needed for moderate pain. (Patient not taking: Reported on 03/21/2020) 15 tablet 0   No current facility-administered medications on file prior to visit.          Objective:     Vitals:   03/21/20 1047  BP: 118/72  Pulse: 92   Filed Weights   03/21/20 1047  Weight: 210 lb 12.8 oz (95.6  kg)               Abdomen:  Soft.  Non-tender.  No masses.  No HSM.  Incision/s: Intact.  Healing well.  No erythema.  No drainage.      Assessment:    G3P1001 Patient Active Problem List   Diagnosis Date Noted  . Postoperative state 03/13/2020  . History of ovarian cyst 02/15/2020  . Gallstones 01/12/2018  . Family history of cardiac disorder 01/08/2018  . RUQ pain 01/08/2018  . 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 Syncope 01/06/2018  . BMI 33.0-33.9,adult 07/09/2017  . Hx of migraine headaches 07/09/2017  . Endometriosis determined by laparoscopy 01/30/2016  . Pelvic pain 01/17/2016     1. Post-operative state   2. Dysuria     Dysuria possibly from UTI versus anterior repair.   Plan:            1.  Encourage patient to increase fluids  2.  Patient to use fiber laxatives to keep stool soft and now that she is off narcotics expect bowel movements to be much more regular.  3.  Urine sent for C&S -will contact patient when results return. Orders Orders Placed This Encounter  Procedures  . Urine Culture    No orders of the defined types were placed in this encounter.     F/U  Return in about 5 weeks (around 04/25/2020).  Elonda Husky, M.D. 03/21/2020 11:20 AM

## 2020-03-23 LAB — URINE CULTURE

## 2020-03-27 NOTE — Anesthesia Postprocedure Evaluation (Signed)
Anesthesia Post Note  Patient: Stephanie Hampton  Procedure(s) Performed: LAPAROSCOPIC ASSISTED VAGINAL HYSTERECTOMY (N/A ) LAPAROSCOPIC BILATERAL SALPINGECTOMY (Bilateral ) ANTERIOR REPAIR (CYSTOCELE) (N/A )  Patient location during evaluation: PACU Anesthesia Type: General Level of consciousness: awake and alert Pain management: pain level controlled Vital Signs Assessment: post-procedure vital signs reviewed and stable Respiratory status: spontaneous breathing, nonlabored ventilation, respiratory function stable and patient connected to nasal cannula oxygen Cardiovascular status: blood pressure returned to baseline and stable Postop Assessment: no apparent nausea or vomiting Anesthetic complications: no   No complications documented.   Last Vitals:  Vitals:   03/13/20 2319 03/14/20 0313  BP: (!) 106/59 96/60  Pulse: 70 64  Resp: 16 18  Temp: 36.9 C 37.1 C  SpO2: 96% 97%    Last Pain:  Vitals:   03/14/20 1310  TempSrc:   PainSc: 6                  Yevette Edwards

## 2020-04-04 ENCOUNTER — Emergency Department: Payer: BC Managed Care – PPO

## 2020-04-04 ENCOUNTER — Emergency Department
Admission: EM | Admit: 2020-04-04 | Discharge: 2020-04-04 | Disposition: A | Discharge status: Home / Self Care | Payer: BC Managed Care – PPO | Attending: Student in an Organized Health Care Education/Training Program | Admitting: Student in an Organized Health Care Education/Training Program

## 2020-04-04 ENCOUNTER — Other Ambulatory Visit: Payer: Self-pay

## 2020-04-04 DIAGNOSIS — U071 COVID-19: Secondary | ICD-10-CM | POA: Diagnosis not present

## 2020-04-04 DIAGNOSIS — J069 Acute upper respiratory infection, unspecified: Secondary | ICD-10-CM

## 2020-04-04 DIAGNOSIS — R059 Cough, unspecified: Secondary | ICD-10-CM | POA: Diagnosis present

## 2020-04-04 DIAGNOSIS — Z20822 Contact with and (suspected) exposure to covid-19: Secondary | ICD-10-CM

## 2020-04-04 MED ORDER — ALBUTEROL SULFATE HFA 108 (90 BASE) MCG/ACT IN AERS: 2.0000 | INHALATION_SPRAY | Freq: Four times a day (QID) | RESPIRATORY_TRACT | 2 refills | Status: DC | PRN

## 2020-04-04 MED ORDER — ONDANSETRON HCL 4 MG PO TABS: 4.0000 mg | ORAL_TABLET | Freq: Every day | ORAL | 0 refills | Status: DC | PRN

## 2020-04-04 NOTE — ED Triage Notes (Signed)
Pt comes via POV from home with c/o fever and some cough when pain.

## 2020-04-04 NOTE — ED Notes (Signed)
Pt assessed by provider prior to d/c.  

## 2020-04-04 NOTE — ED Provider Notes (Signed)
[facility] Emergency Department Provider Note    Event Date/Time   First MD Initiated Contact with Patient 04/04/20 1301     (approximate)  I have reviewed the triage vital signs and the nursing notes.   HISTORY  Chief Complaint Fever    HPI Stephanie Hampton is a 25 y.o. female with the below listed past medical history status post hysterectomy presents to the ER for cough congestion fevers for the past several days.  States her husband recently tested positive for Covid.  She is not vaccinated.  She came to the ER because she started feeling lightheaded and weak with ambulation wanted to be evaluated.  Has had somewhat decreased p.o. intake and loss of appetite but still has taste.  Did have few episodes of watery diarrhea.  Does not have any pain with deep inspiration.  No lower extremity swelling.  Denies any chest pain at this time has a nonproductive cough.  She does vape.    Past Medical History:  Diagnosis Date  . 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 Depression   . Endometriosis   . GERD (gastroesophageal reflux disease)   . Ovarian cyst    Family History  Problem Relation Age of Onset  . Heart disease Father   . Depression Mother   . Cancer Neg Hx   . Ovarian cancer Neg Hx   . Colon cancer Neg Hx   . Diabetes Neg Hx    Past Surgical History:  Procedure Laterality Date  . CHOLECYSTECTOMY    . CYSTOCELE REPAIR N/A 03/13/2020   Procedure: ANTERIOR REPAIR (CYSTOCELE);  Surgeon: Linzie Collin, MD;  Location: [facility] ORS;  Service: Gynecology;  Laterality: N/A;  . LAPAROSCOPIC ASSISTED VAGINAL HYSTERECTOMY N/A 03/13/2020   Procedure: LAPAROSCOPIC ASSISTED VAGINAL HYSTERECTOMY;  Surgeon: Linzie Collin, MD;  Location: [facility] ORS;  Service: Gynecology;  Laterality: N/A;  . LAPAROSCOPIC BILATERAL SALPINGECTOMY Bilateral 03/13/2020   Procedure: LAPAROSCOPIC BILATERAL SALPINGECTOMY;  Surgeon: Linzie Collin, MD;  Location: [facility] ORS;  Service: Gynecology;  Laterality: Bilateral;  . LAPAROSCOPIC OVARIAN CYSTECTOMY Left 01/22/2016   Procedure: LAPAROSCOPIC LEFT OVARIAN CYSTECTOMY-POSSIBLE BIOPSIES;  Surgeon: Herold Harms, MD;  Location: [facility] ORS;  Service: Gynecology;  Laterality: Left;  excision/fulgeration of endometriosis  . LOOP RECORDER IMPLANT    . NO PAST SURGERIES     Patient Active Problem List   Diagnosis Date Noted  . Postoperative state 03/13/2020  . History of ovarian cyst 02/15/2020  . Gallstones 01/12/2018  . Family history of cardiac disorder 01/08/2018  . RUQ pain 01/08/2018  . 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 Syncope 01/06/2018  . BMI 33.0-33.9,adult 07/09/2017  . Hx of migraine headaches 07/09/2017  . Endometriosis determined by laparoscopy 01/30/2016  . Pelvic pain 01/17/2016      Prior to Admission medications   Medication Sig Start Date End Date Taking? Authorizing Provider  albuterol (VENTOLIN HFA) 108 (90 Base) MCG/ACT inhaler Inhale 2 puffs into the lungs every 6 (six) hours as needed for wheezing or shortness of breath. 04/04/20  Yes Willy Eddy, MD  ondansetron (ZOFRAN) 4 MG tablet Take 1 tablet (4 mg total) by mouth daily as needed. 04/04/20 04/04/21 Yes Willy Eddy, MD  Emollient (ROC RETINOL CORREXION) CREA Apply 1 application topically every evening.    [provider]  famotidine (PEPCID) 20 MG tablet Take 20 mg by mouth 2 (two) times daily as needed for heartburn or indigestion.  [provider]  levonorgestrel (MIRENA) 20 MCG/24HR IUD 1 each by Intrauterine route once.    [provider]  oxyCODONE-acetaminophen (PERCOCET/ROXICET) 5-325 MG tablet Take 1-2 tablets by mouth every 4 (four) hours as needed for moderate pain. Patient not taking: Reported on 03/21/2020 03/14/20   Harlin Heys, MD  sertraline (ZOLOFT) 50 MG tablet TAKE 1 TABLET BY MOUTH EVERY DAY Patient taking differently: Take  50 mg by mouth every evening. 02/07/20   Philip Aspen, CNM    Allergies Augmentin [amoxicillin-pot clavulanate] and Metoclopramide    Social History Social History   Tobacco Use  . Smoking status: Never Smoker  . Smokeless tobacco: Never Used  Vaping Use  . Vaping Use: Former  Substance Use Topics  . Alcohol use: No    Alcohol/week: 0.0 standard drinks  . Drug use: No    Review of Systems Patient denies headaches, rhinorrhea, blurry vision, numbness, shortness of breath, chest pain, edema, cough, abdominal pain, nausea, vomiting, diarrhea, dysuria, fevers, rashes or hallucinations unless otherwise stated above in HPI. ____________________________________________   PHYSICAL EXAM:  VITAL SIGNS: Vitals:   04/04/20 1230  BP: 136/77  Pulse: 93  Resp: 18  Temp: 99.7 F (37.6 C)  SpO2: 99%    Constitutional: Alert and oriented.  Eyes: Conjunctivae are normal.  Head: Atraumatic. Nose: No congestion/rhinnorhea. Mouth/Throat: Mucous membranes are moist.   Neck: No stridor. Painless ROM.  Cardiovascular: Normal rate, regular rhythm. Grossly normal heart sounds.  Good peripheral circulation. Respiratory: Normal respiratory effort.  No retractions. Lungs CTAB. Gastrointestinal: Soft and nontender. No distention. No abdominal bruits. No CVA tenderness. Genitourinary:  Musculoskeletal: No lower extremity tenderness nor edema.  No joint effusions. Neurologic:  Normal speech and language. No gross focal neurologic deficits are appreciated. No facial droop Skin:  Skin is warm, dry and intact. No rash noted. Psychiatric: Mood and affect are normal. Speech and behavior are normal.  ____________________________________________   LABS (all labs ordered are listed, but only abnormal results are displayed)  No results found for this or any previous visit (from the past 24  hour(s)). ____________________________________________ ____________________________________________  [id number]  I personally reviewed all radiographic images ordered to evaluate for the above acute complaints and reviewed radiology reports and findings.  These findings were personally discussed with the patient.  Please see medical record for radiology report.  ____________________________________________   PROCEDURES  Procedure(s) performed:  Procedures    Critical Care performed: no ____________________________________________   INITIAL IMPRESSION / ASSESSMENT AND PLAN / ED COURSE  Pertinent labs & imaging results that were available during my care of the patient were reviewed by me and considered in my medical decision making (see chart for details).   DDX: covid, Asthma, copd, CHF, pna, ptx, malignancy, Pe,   Stephanie Hampton is a 40 y.o. who presents to the ED with URI symptoms as described above.  She has low-grade temperature without any hypoxia.  Almost certainly has covid 19 although appears well nontoxic.  Have a low suspicion for PE.  Chest x-ray ordered to evaluate for evidence of infiltrates.  As she is a smoker will treat with albuterol bronchodilators.  Chest x-ray without infiltrate.  She is not hypoxic her symptoms are mild at this point.  Do not feel that further diagnostic testing clinically indicated.  She is appropriate for further observation as an outpatient and symptomatic management discussed signs and symptoms for which she should return to the ER.  Patient agreeable to plan.     The  patient was evaluated in Emergency Department today for the symptoms described in the history of present illness. He/she was evaluated in the context of the global COVID-19 pandemic, which necessitated consideration that the patient might be at risk for infection with the SARS-CoV-2 virus that causes COVID-19. Institutional protocols and algorithms that pertain to the evaluation  of patients at risk for COVID-19 are in a state of rapid change based on information released by regulatory bodies including the CDC and federal and state organizations. These policies and algorithms were followed during the patient's care in the ED.  As part of my medical decision making, I reviewed the following data within the electronic MEDICAL RECORD NUMBER Nursing notes reviewed and incorporated, Labs reviewed, notes from prior ED visits and [place] Controlled Substance Database   ____________________________________________   FINAL CLINICAL IMPRESSION(S) / ED DIAGNOSES  Final diagnoses:  Suspected COVID-19 virus infection  Upper respiratory tract infection, unspecified type      NEW MEDICATIONS STARTED DURING THIS VISIT:  New Prescriptions   ALBUTEROL (VENTOLIN HFA) 108 (90 BASE) MCG/ACT INHALER    Inhale 2 puffs into the lungs every 6 (six) hours as needed for wheezing or shortness of breath.   ONDANSETRON (ZOFRAN) 4 MG TABLET    Take 1 tablet (4 mg total) by mouth daily as needed.     Note:  This document was prepared using Dragon voice recognition software and may include unintentional dictation errors.    Willy Eddy, MD 04/04/20 1346

## 2020-04-05 LAB — SARS CORONAVIRUS 2 (TAT 6-24 HRS): SARS Coronavirus 2: POSITIVE — AB

## 2020-04-13 ENCOUNTER — Emergency Department: Payer: BC Managed Care – PPO

## 2020-04-13 ENCOUNTER — Emergency Department
Admission: EM | Admit: 2020-04-13 | Discharge: 2020-04-13 | Disposition: A | Discharge status: Home / Self Care | Payer: BC Managed Care – PPO | Attending: Emergency Medicine | Admitting: Emergency Medicine

## 2020-04-13 ENCOUNTER — Encounter: Payer: Self-pay | Admitting: Surgical

## 2020-04-13 ENCOUNTER — Other Ambulatory Visit: Payer: Self-pay

## 2020-04-13 ENCOUNTER — Encounter: Payer: Self-pay | Admitting: Emergency Medicine

## 2020-04-13 DIAGNOSIS — R109 Unspecified abdominal pain: Secondary | ICD-10-CM

## 2020-04-13 DIAGNOSIS — K219 Gastro-esophageal reflux disease without esophagitis: Secondary | ICD-10-CM | POA: Diagnosis not present

## 2020-04-13 DIAGNOSIS — R102 Pelvic and perineal pain: Secondary | ICD-10-CM | POA: Insufficient documentation

## 2020-04-13 DIAGNOSIS — G8918 Other acute postprocedural pain: Secondary | ICD-10-CM | POA: Diagnosis not present

## 2020-04-13 DIAGNOSIS — N83519 Torsion of ovary and ovarian pedicle, unspecified side: Secondary | ICD-10-CM

## 2020-04-13 DIAGNOSIS — R110 Nausea: Secondary | ICD-10-CM | POA: Diagnosis not present

## 2020-04-13 LAB — CBC
HCT: 44.5 % (ref 36.0–46.0)
Hemoglobin: 14.9 g/dL (ref 12.0–15.0)
MCH: 28.9 pg (ref 26.0–34.0)
MCHC: 33.5 g/dL (ref 30.0–36.0)
MCV: 86.2 fL (ref 80.0–100.0)
Platelets: 318 10*3/uL (ref 150–400)
RBC: 5.16 MIL/uL — ABNORMAL HIGH (ref 3.87–5.11)
RDW: 12.9 % (ref 11.5–15.5)
WBC: 14.9 10*3/uL — ABNORMAL HIGH (ref 4.0–10.5)
nRBC: 0 % (ref 0.0–0.2)

## 2020-04-13 LAB — COMPREHENSIVE METABOLIC PANEL
ALT: 33 U/L (ref 0–44)
AST: 28 U/L (ref 15–41)
Albumin: 4.8 g/dL (ref 3.5–5.0)
Alkaline Phosphatase: 101 U/L (ref 38–126)
Anion gap: 13 (ref 5–15)
BUN: 10 mg/dL (ref 6–20)
CO2: 22 mmol/L (ref 22–32)
Calcium: 9.6 mg/dL (ref 8.9–10.3)
Chloride: 103 mmol/L (ref 98–111)
Creatinine, Ser: 0.63 mg/dL (ref 0.44–1.00)
GFR, Estimated: >60 mL/min (ref >60)
Glucose, Bld: 110 mg/dL — ABNORMAL HIGH (ref 70–99)
Potassium: 3.7 mmol/L (ref 3.5–5.1)
Sodium: 138 mmol/L (ref 135–145)
Total Bilirubin: 0.9 mg/dL (ref 0.3–1.2)
Total Protein: 8.5 g/dL — ABNORMAL HIGH (ref 6.5–8.1)

## 2020-04-13 LAB — URINALYSIS, COMPLETE (UACMP) WITH MICROSCOPIC
Bacteria, UA: NONE SEEN
Bilirubin Urine: NEGATIVE
Glucose, UA: NEGATIVE mg/dL
Hgb urine dipstick: NEGATIVE
Ketones, ur: 20 mg/dL — AB
Nitrite: NEGATIVE
Protein, ur: 30 mg/dL — AB
Specific Gravity, Urine: >1.046 — ABNORMAL HIGH (ref 1.005–1.030)
pH: 8 (ref 5.0–8.0)

## 2020-04-13 LAB — POC URINE PREG, ED: Preg Test, Ur: NEGATIVE

## 2020-04-13 LAB — LIPASE, BLOOD: Lipase: 27 U/L (ref 11–51)

## 2020-04-13 MED ORDER — HYDROCODONE-ACETAMINOPHEN 5-325 MG PO TABS
1.0000 | ORAL_TABLET | Freq: Once | ORAL | Status: AC
  Administered 2020-04-13: 1 via ORAL
  Filled 2020-04-13: qty 1

## 2020-04-13 MED ORDER — SODIUM CHLORIDE 0.9 % IV BOLUS
1000.0000 mL | Freq: Once | INTRAVENOUS | Status: AC
  Administered 2020-04-13: 1000 mL via INTRAVENOUS

## 2020-04-13 MED ORDER — HYDROMORPHONE HCL 1 MG/ML IJ SOLN
1.0000 mg | Freq: Once | INTRAMUSCULAR | Status: AC
  Administered 2020-04-13: 1 mg via INTRAVENOUS
  Filled 2020-04-13: qty 1

## 2020-04-13 MED ORDER — ONDANSETRON 4 MG PO TBDP: 4.0000 mg | ORAL_TABLET | Freq: Once | ORAL | Status: AC | PRN

## 2020-04-13 MED ORDER — HYDROMORPHONE HCL 1 MG/ML IJ SOLN: 0.5000 mg | Freq: Once | INTRAMUSCULAR | Status: DC

## 2020-04-13 MED ORDER — KETOROLAC TROMETHAMINE 30 MG/ML IJ SOLN
15.0000 mg | Freq: Once | INTRAMUSCULAR | Status: AC
  Administered 2020-04-13: 15 mg via INTRAVENOUS
  Filled 2020-04-13: qty 1

## 2020-04-13 MED ORDER — FENTANYL CITRATE (PF) 100 MCG/2ML IJ SOLN
50.0000 ug | Freq: Once | INTRAMUSCULAR | Status: AC
  Administered 2020-04-13: 50 ug via INTRAVENOUS
  Filled 2020-04-13: qty 2

## 2020-04-13 MED ORDER — ONDANSETRON 4 MG PO TBDP: 4.0000 mg | ORAL_TABLET | Freq: Three times a day (TID) | ORAL | 0 refills | Status: DC | PRN

## 2020-04-13 MED ORDER — ONDANSETRON HCL 4 MG/2ML IJ SOLN
4.0000 mg | Freq: Once | INTRAMUSCULAR | Status: AC
  Administered 2020-04-13: 4 mg via INTRAVENOUS
  Filled 2020-04-13: qty 2

## 2020-04-13 MED ORDER — IBUPROFEN 600 MG PO TABS: 600.0000 mg | ORAL_TABLET | Freq: Three times a day (TID) | ORAL | 0 refills | Status: AC

## 2020-04-13 MED ORDER — IOHEXOL 300 MG/ML  SOLN
100.0000 mL | Freq: Once | INTRAMUSCULAR | Status: AC | PRN
  Administered 2020-04-13: 100 mL via INTRAVENOUS

## 2020-04-13 MED ORDER — HYDROCODONE-ACETAMINOPHEN 5-325 MG PO TABS: 1.0000 | ORAL_TABLET | Freq: Four times a day (QID) | ORAL | 0 refills | Status: DC | PRN

## 2020-04-13 MED ORDER — ONDANSETRON 4 MG PO TBDP
ORAL_TABLET | ORAL | Status: AC
  Administered 2020-04-13: 4 mg via ORAL
  Filled 2020-04-13: qty 1

## 2020-04-13 NOTE — Discharge Instructions (Addendum)
As we discussed, your symptoms could be due to a small fluid collection related to the surgery.  This does not appear to be infected and will largely resolve by itself.  For now, take the ibuprofen 3 times a day for the next 5 days.  Take the hydrocodone for severe pain.  I would recommend starting a low-dose baby aspirin daily until no longer in pain

## 2020-04-13 NOTE — ED Triage Notes (Addendum)
Pt comes into the ED via [facility] from home c/o right abdominal pain and right flank pain.  Pt describes pain as sharp.  Recent h/o hysterectomy.  Afebrile, 130/92, 86 HR, 99% RA, CBG 106, 97.7 temp.  Pt ambulatory to triage at this time but does have tenderness with palpation.

## 2020-04-13 NOTE — ED Provider Notes (Signed)
[facility] Emergency Department Provider Note  ____________________________________________   Event Date/Time   First MD Initiated Contact with Patient 04/13/20 1023     (approximate)  I have reviewed the triage vital signs and the nursing notes.   HISTORY  Chief Complaint Abdominal Pain and Flank Pain    HPI Stephanie Hampton is a 25 y.o. female  Here with abd pain. Woke up this AM with severe R flank/side pain. Began this AM fairly acutely. Associated with nausea but no vomiting. Had hysterectomy 4 weeks ago 2/2 endometriosis and uterine prolapse. Status post cholecystectomy. No vaginal bleeding. Pain is fairly diffuse, mostly lower abd. it is aching, gnawing, intermittently cramp-like.  Pain is worse with palpation. No alleviating factors. H/o ovarian cysts with rupture, felt similar but not as bad.     Past Medical History:  Diagnosis Date  . 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 Depression   . Endometriosis   . GERD (gastroesophageal reflux disease)   . Ovarian cyst     Patient Active Problem List   Diagnosis Date Noted  . Postoperative state 03/13/2020  . History of ovarian cyst 02/15/2020  . Gallstones 01/12/2018  . Family history of cardiac disorder 01/08/2018  . RUQ pain 01/08/2018  . 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 Syncope 01/06/2018  . BMI 33.0-33.9,adult 07/09/2017  . Hx of migraine headaches 07/09/2017  . Endometriosis determined by laparoscopy 01/30/2016  . Pelvic pain 01/17/2016    Past Surgical History:  Procedure Laterality Date  . CHOLECYSTECTOMY    . CYSTOCELE REPAIR N/A 03/13/2020   Procedure: ANTERIOR REPAIR (CYSTOCELE);  Surgeon: Linzie Collin, MD;  Location: [facility] ORS;  Service: Gynecology;  Laterality: N/A;  . LAPAROSCOPIC ASSISTED VAGINAL HYSTERECTOMY N/A 03/13/2020   Procedure: LAPAROSCOPIC ASSISTED VAGINAL HYSTERECTOMY;  Surgeon: Linzie Collin, MD;   Location: [facility] ORS;  Service: Gynecology;  Laterality: N/A;  . LAPAROSCOPIC BILATERAL SALPINGECTOMY Bilateral 03/13/2020   Procedure: LAPAROSCOPIC BILATERAL SALPINGECTOMY;  Surgeon: Linzie Collin, MD;  Location: [facility] ORS;  Service: Gynecology;  Laterality: Bilateral;  . LAPAROSCOPIC OVARIAN CYSTECTOMY Left 01/22/2016   Procedure: LAPAROSCOPIC LEFT OVARIAN CYSTECTOMY-POSSIBLE BIOPSIES;  Surgeon: Herold Harms, MD;  Location: [facility] ORS;  Service: Gynecology;  Laterality: Left;  excision/fulgeration of endometriosis  . LOOP RECORDER IMPLANT    . NO PAST SURGERIES      Prior to Admission medications   Medication Sig Start Date End Date Taking? Authorizing Provider  HYDROcodone-acetaminophen (NORCO/VICODIN) 5-325 MG tablet Take 1-2 tablets by mouth every 6 (six) hours as needed for moderate pain or severe pain. 04/13/20 04/13/21 Yes Shaune Pollack, MD  ibuprofen (ADVIL) 600 MG tablet Take 1 tablet (600 mg total) by mouth 3 (three) times daily for 5 days. 04/13/20 04/18/20 Yes Shaune Pollack, MD  ondansetron (ZOFRAN ODT) 4 MG disintegrating tablet Take 1 tablet (4 mg total) by mouth every 8 (eight) hours as needed for nausea or vomiting. 04/13/20  Yes Shaune Pollack, MD  albuterol (VENTOLIN HFA) 108 (90 Base) MCG/ACT inhaler Inhale 2 puffs into the lungs every 6 (six) hours as needed for wheezing or shortness of breath. 04/04/20   Willy Eddy, MD  Emollient (ROC RETINOL CORREXION) CREA Apply 1 application topically every evening.    [provider]  famotidine (PEPCID) 20 MG tablet Take 20 mg by mouth 2 (two) times daily as needed for heartburn or indigestion.    [provider]  levonorgestrel (MIRENA) 20 MCG/24HR  IUD 1 each by Intrauterine route once.    [provider]  ondansetron (ZOFRAN) 4 MG tablet Take 1 tablet (4 mg total) by mouth daily as needed. 04/04/20 04/04/21  Willy Eddyobinson, Patrick, MD  oxyCODONE-acetaminophen (PERCOCET/ROXICET) 5-325 MG tablet Take 1-2  tablets by mouth every 4 (four) hours as needed for moderate pain. Patient not taking: Reported on 03/21/2020 03/14/20   Linzie CollinEvans, David James, MD  sertraline (ZOLOFT) 50 MG tablet TAKE 1 TABLET BY MOUTH EVERY DAY Patient taking differently: Take 50 mg by mouth every evening. 02/07/20   Doreene Burkehompson, Annie, CNM    Allergies Augmentin [amoxicillin-pot clavulanate] and Metoclopramide  Family History  Problem Relation Age of Onset  . Heart disease Father   . Depression Mother   . Cancer Neg Hx   . Ovarian cancer Neg Hx   . Colon cancer Neg Hx   . Diabetes Neg Hx     Social History Social History   Tobacco Use  . Smoking status: Never Smoker  . Smokeless tobacco: Never Used  Vaping Use  . Vaping Use: Former  Substance Use Topics  . Alcohol use: No    Alcohol/week: 0.0 standard drinks  . Drug use: No    Review of Systems  Review of Systems  Constitutional: Negative for fatigue and fever.  HENT: Negative for congestion and sore throat.   Eyes: Negative for visual disturbance.  Respiratory: Negative for cough and shortness of breath.   Cardiovascular: Negative for chest pain.  Gastrointestinal: Positive for abdominal pain, nausea and vomiting. Negative for diarrhea.  Genitourinary: Positive for flank pain.  Musculoskeletal: Negative for back pain and neck pain.  Skin: Negative for rash and wound.  Neurological: Negative for weakness.  All other systems reviewed and are negative.    ____________________________________________  PHYSICAL EXAM:      VITAL SIGNS: ED Triage Vitals  Enc Vitals Group     BP 04/13/20 0905 (!) 107/51     Pulse Rate 04/13/20 0905 76     Resp 04/13/20 0905 18     Temp 04/13/20 0905 98.3 F (36.8 C)     Temp Source 04/13/20 0905 Oral     SpO2 04/13/20 0905 100 %     Weight 04/13/20 0838 210 lb (95.3 kg)     Height 04/13/20 0838 5\' 4"  (1.626 m)     Head Circumference --      Peak Flow --      Pain Score 04/13/20 0836 10     Pain Loc --       Pain Edu? --      Excl. in GC? --      Physical Exam Vitals and nursing note reviewed.  Constitutional:      General: She is not in acute distress.    Appearance: She is well-developed.     Comments: Appears uncomfortable  HENT:     Head: Normocephalic and atraumatic.  Eyes:     Conjunctiva/sclera: Conjunctivae normal.  Cardiovascular:     Rate and Rhythm: Normal rate and regular rhythm.     Heart sounds: Normal heart sounds. No murmur heard. No friction rub.  Pulmonary:     Effort: Pulmonary effort is normal. No respiratory distress.     Breath sounds: Normal breath sounds. No wheezing or rales.  Abdominal:     General: There is no distension.     Palpations: Abdomen is soft.     Tenderness: There is abdominal tenderness in the right lower quadrant, suprapubic area and  left lower quadrant. There is guarding. There is no rebound.  Musculoskeletal:     Cervical back: Neck supple.  Skin:    General: Skin is warm.     Capillary Refill: Capillary refill takes less than 2 seconds.  Neurological:     Mental Status: She is alert and oriented to person, place, and time.     Motor: No abnormal muscle tone.       ____________________________________________   LABS (all labs ordered are listed, but only abnormal results are displayed)  Labs Reviewed  COMPREHENSIVE METABOLIC PANEL - Abnormal; Notable for the following components:      Result Value   Glucose, Bld 110 (*)    Total Protein 8.5 (*)    All other components within normal limits  CBC - Abnormal; Notable for the following components:   WBC 14.9 (*)    RBC 5.16 (*)    All other components within normal limits  URINALYSIS, COMPLETE (UACMP) WITH MICROSCOPIC - Abnormal; Notable for the following components:   Color, Urine YELLOW (*)    APPearance HAZY (*)    Specific Gravity, Urine >1.046 (*)    Ketones, ur 20 (*)    Protein, ur 30 (*)    Leukocytes,Ua SMALL (*)    All other components within normal limits   LIPASE, BLOOD  POC URINE PREG, ED    ____________________________________________  EKG:  ________________________________________  RADIOLOGY All imaging, including plain films, CT scans, and ultrasounds, independently reviewed by me, and interpretations confirmed via formal radiology reads.  ED MD interpretation:   CT A/P: Normal appendix, interval development of large R gonadal veins, s/p hysterectomy w/ seroma US Pelvis: normal flow to R ovary, seroma noted  Official radiology report(s): CT ABDOMEN PELVIS W CONTRAST  Result Date: 04/13/2020 CLINICAL DATA:  25 year old female with history of right lower quadrant and right-sided flank pain abdominal pain. EXAM: CT ABDOMEN AND PELVIS WITH CONTRAST TECHNIQUE: Multidetector CT imaging of the abdomen and pelvis was performed using the standard protocol following bolus administration of intravenous contrast. CONTRAST:  100mL OMNIPAQUE IOHEXOL 300 MG/ML  SOLN COMPARISON:  CT the abdomen and pelvis 01/24/2020. FINDINGS: Lower chest: Unremarkable. Hepatobiliary: No suspicious cystic or solid hepatic lesions. No intra or extrahepatic biliary ductal dilatation. Status post cholecystectomy. Pancreas: No pancreatic mass. No pancreatic ductal dilatation. No pancreatic or peripancreatic fluid collections or inflammatory changes. Spleen: Unremarkable. Adrenals/Urinary Tract: Bilateral kidneys and bilateral adrenal glands are normal in appearance. No hydroureteronephrosis. Urinary bladder is normal in appearance. Stomach/Bowel: Normal appearance of the stomach. No pathologic dilatation of small bowel or colon. Normal appendix. Vascular/Lymphatic: No significant atherosclerotic disease, aneurysm or dissection noted in the abdominal or pelvic vasculature. See discussion below regarding the right gonadal vein) no lymphadenopathy noted in the abdomen or pelvis. Reproductive: When compared to the prior study there has been interval hysterectomy. Adjacent to the  vaginal apex there is now a well-defined low-attenuation collection measuring 7.7 x 6.6 x 5.0 cm (axial image 60 of series 2 and coronal image 52 of series 5), without significant mural thickening or enhancement, most compatible with a postoperative seroma or peritoneal inclusion cyst. Left ovary is unremarkable in appearance. Right ovary appears enlarged with multiple small follicles which have low attenuation centers. Notably, there is severe dilatation and varicosity of the right gonadal vein, with surrounding inflammatory changes in the adjacent fat indicative of phlebitis. Other: No significant volume of ascites.  No pneumoperitoneum. Musculoskeletal: There are no aggressive appearing lytic or blastic lesions noted in  the visualized portions of the skeleton. IMPRESSION: 1. Normal appendix. 2. 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 Right ovary is unusual in appearance with multiple prominent follicles which have low attenuation centers. The possibility of intermittent torsion and de torsion of the right ovary warrants consideration. Further evaluation with pelvic ultrasound is recommended at this time. 3. Status post hysterectomy with new low-attenuation collection in the anatomic pelvis adjacent to the vaginal apex which likely represents a postoperative seroma or peritoneal inclusion cyst. 4. Status post cholecystectomy. Electronically Signed   By: Trudie Reed M.D.   On: 04/13/2020 11:56   US ABDOMINAL PELVIC ART/VENT FLOW DOPPLER  Result Date: 04/13/2020 CLINICAL DATA:  Adnexal pain.  Hysterectomy 1 month prior EXAM: TRANSABDOMINAL AND TRANSVAGINAL ULTRASOUND OF PELVIS DOPPLER ULTRASOUND OF OVARIES TECHNIQUE: Study was performed transabdominally to optimize pelvic field of view evaluation and transvaginally to optimize internal visceral architecture  evaluation. Color and duplex Doppler ultrasound was utilized to evaluate blood flow to the ovaries. COMPARISON:  CT abdomen and pelvis April 13, 2020 FINDINGS: Uterus Surgically absent Right ovary Measurements: 4.4 x 2.6 x 4.0 cm = volume: 23 mL. Normal appearance/no adnexal mass. Left ovary Unable to visualize by transabdominal or transvaginal technique. No left-sided pelvic mass. Pulsed Doppler evaluation of right ovary demonstrates normal low-resistance arterial and venous waveforms. Other findings There is a fluid collection posterior to the urinary bladder containing slight debris, measuring 4.4 x 6.8 x 7.0 cm. This fluid collection corresponds to the fluid collection seen on CT earlier in the day and most likely represents a postoperative seroma. No free fluid evident. IMPRESSION: 1. Right ovary is normal in appearance. No findings indicative of right ovarian torsion. No right-sided pelvic/adnexal mass. 2. Minimally complex fluid collection posterior to the urinary bladder, a likely postoperative seroma, measuring 4.8 x 6.8 x 7.0 cm. This finding is unchanged from appearance on CT earlier in the day. 3. Unable to visualize left ovary by either transabdominal transvaginal technique. No left-sided pelvic/adnexal mass evident. 4.  No free pelvic fluid. Five.  Uterus absent. Electronically Signed   By: Bretta Bang III M.D.   On: 04/13/2020 14:40    ____________________________________________  PROCEDURES   Procedure(s) performed (including Critical Care):  Procedures  ____________________________________________  INITIAL IMPRESSION / MDM / ASSESSMENT AND PLAN / ED COURSE  As part of my medical decision making, I reviewed the following data within the electronic MEDICAL RECORD NUMBER Nursing notes reviewed and incorporated, Old chart reviewed, Notes from prior ED visits, and [place] Controlled Substance Database       *JOSELLE DEEDS was evaluated in Emergency Department on 04/13/2020 for the  symptoms described in the history of present illness. She was evaluated in the context of the global COVID-19 pandemic, which necessitated consideration that the patient might be at risk for infection with the SARS-CoV-2 virus that causes COVID-19. Institutional protocols and algorithms that pertain to the evaluation of patients at risk for COVID-19 are in a state of rapid change based on information released by regulatory bodies including the CDC and federal and state organizations. These policies and algorithms were followed during the patient's care in the ED.  Some ED evaluations and interventions may be delayed as a result of limited staffing during the pandemic.*     Medical Decision Making:  25 yo F here with R flank pain, suprapubic pain. She is several weeks  s/p recent hysterectomy. Pt afebrile, denies any fevers or signs of sepsis. Labs show moderate leukocytosis but are otherwise unremarkable. Initially, CT A/P obtained, reviewed and is neg for appendicitis, but does show possible R ovarian vein phlebitis. Called and discussed case with Dr. Logan Bores of OB, who performed surgery. U/S obtained for further evaluation which shows normal flow to R ovary, post-op seroma. Pain is markedly improved after fluids, analgesia. He does not suspect intermittent torsion or septic phlebitis, recommends NSAIDs, analgesia, and outpt follow-up on Monday. No ABX indicated per OB. Pt updated, and is in agreement. Suspect pain related to seroma. Per Dr. Logan Bores, no anticoagulation indicated at this time which is reasonable given normal flow, no venous congestion on u/s.   ____________________________________________  FINAL CLINICAL IMPRESSION(S) / ED DIAGNOSES  Final diagnoses:  Adnexal pain  Post-operative pain  Right flank pain     MEDICATIONS GIVEN DURING THIS VISIT:  Medications  ondansetron (ZOFRAN-ODT) disintegrating tablet 4 mg (4 mg Oral Given 04/13/20 0955)  HYDROmorphone (DILAUDID) injection 1 mg (1 mg  Intravenous Given 04/13/20 1100)  ondansetron (ZOFRAN) injection 4 mg (4 mg Intravenous Given 04/13/20 1059)  ketorolac (TORADOL) 30 MG/ML injection 15 mg (15 mg Intravenous Given 04/13/20 1059)  sodium chloride 0.9 % bolus 1,000 mL (0 mLs Intravenous Stopped 04/13/20 1611)  iohexol (OMNIPAQUE) 300 MG/ML solution 100 mL (100 mLs Intravenous Contrast Given 04/13/20 1126)  sodium chloride 0.9 % bolus 1,000 mL (0 mLs Intravenous Stopped 04/13/20 1611)  ketorolac (TORADOL) 30 MG/ML injection 15 mg (15 mg Intravenous Given 04/13/20 1447)  fentaNYL (SUBLIMAZE) injection 50 mcg (50 mcg Intravenous Given 04/13/20 1447)  HYDROcodone-acetaminophen (NORCO/VICODIN) 5-325 MG per tablet 1 tablet (1 tablet Oral Given 04/13/20 1610)     ED Discharge Orders         Ordered    ibuprofen (ADVIL) 600 MG tablet  3 times daily        04/13/20 1512    HYDROcodone-acetaminophen (NORCO/VICODIN) 5-325 MG tablet  Every 6 hours PRN        04/13/20 1512    ondansetron (ZOFRAN ODT) 4 MG disintegrating tablet  Every 8 hours PRN        04/13/20 1512    Check temperature        04/13/20 1534           Note:  This document was prepared using Dragon voice recognition software and may include unintentional dictation errors.   Shaune Pollack, MD 04/13/20 2016

## 2020-04-14 ENCOUNTER — Other Ambulatory Visit: Payer: Self-pay | Admitting: Obstetrics and Gynecology

## 2020-04-14 ENCOUNTER — Telehealth: Payer: Self-pay

## 2020-04-14 DIAGNOSIS — R102 Pelvic and perineal pain: Secondary | ICD-10-CM

## 2020-04-14 MED ORDER — OXYCODONE-ACETAMINOPHEN 5-325 MG PO TABS: 1.0000 | ORAL_TABLET | ORAL | 0 refills | Status: DC | PRN

## 2020-04-14 NOTE — Telephone Encounter (Signed)
Spoke with patient and she started getting hives from medication prescribed yesterday for pain. Patient was given Norco for pain. Patient tried it again today to make sure that it was that medication and had the same reaction. Spoke with Dr. Jannette Spanner and he is going to send in percocet. Patient tolerated that well after surgery.

## 2020-04-14 NOTE — Telephone Encounter (Signed)
Seen in the ED yesterday. Was given pain meds.   Pain meds are giving her hives. Can she have something different?    Pharmacy- [facility]  Pls advise.

## 2020-04-17 ENCOUNTER — Ambulatory Visit: Payer: BC Managed Care – PPO | Admitting: Obstetrics and Gynecology

## 2020-04-18 ENCOUNTER — Ambulatory Visit: Payer: BC Managed Care – PPO | Admitting: Obstetrics and Gynecology

## 2020-04-20 ENCOUNTER — Encounter: Payer: Self-pay | Admitting: Obstetrics and Gynecology

## 2020-04-20 ENCOUNTER — Other Ambulatory Visit: Payer: Self-pay

## 2020-04-20 ENCOUNTER — Ambulatory Visit (INDEPENDENT_AMBULATORY_CARE_PROVIDER_SITE_OTHER): Payer: BC Managed Care – PPO | Admitting: Obstetrics and Gynecology

## 2020-04-20 VITALS — BP 108/76 | HR 81 | Ht 64.0 in | Wt 211.3 lb

## 2020-04-20 DIAGNOSIS — R102 Pelvic and perineal pain: Secondary | ICD-10-CM

## 2020-04-20 DIAGNOSIS — Z9889 Other specified postprocedural states: Secondary | ICD-10-CM

## 2020-04-20 NOTE — Progress Notes (Signed)
HPI:      [name] is a 25 y.o. G3P1001 who LMP was Patient's last menstrual period was 02/29/2020.  Subjective:   She presents today after being seen in the emergency department for pelvic pain.  She was found to have a seroma.  The possibility of septic pelvic venous thrombophlebitis or ovarian torsion was in the differential but ruled out.  Since that visit she seems to have intermittent pelvic pain but she states that it is getting better every day.  She is ambulating voiding and eating without difficulty.  She otherwise feels well after her surgery.    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 She does not have any pertinent problems on file. She  has a past surgical history that includes No past surgeries; Laparoscopic ovarian cystectomy (Left, 01/22/2016); Cholecystectomy; Loop recorder implant; Laparoscopic assisted vaginal hysterectomy (N/A, 03/13/2020); Laparoscopic bilateral salpingectomy (Bilateral, 03/13/2020); and Cystocele repair (N/A, 03/13/2020). Her family history includes Depression in her mother; Heart disease in her father. She  reports that she has never smoked. She has never used smokeless tobacco. She reports that she does not drink alcohol and does not use drugs. She has a current medication list which includes the following prescription(s): albuterol, ondansetron, ondansetron, oxycodone-acetaminophen, and sertraline. She is allergic to augmentin [amoxicillin-pot clavulanate] and metoclopramide.       Review of Systems:  Review of Systems  Constitutional: Denied constitutional symptoms, night sweats, recent illness, fatigue, fever, insomnia and weight loss.  Eyes: Denied eye symptoms, eye pain,  photophobia, vision change and visual disturbance.  Ears/Nose/Throat/Neck: Denied ear, nose, throat or neck symptoms, hearing loss, nasal discharge, sinus congestion and sore throat.  Cardiovascular: Denied cardiovascular symptoms, arrhythmia, chest pain/pressure, edema, exercise intolerance, orthopnea and palpitations.  Respiratory: Denied pulmonary symptoms, asthma, pleuritic pain, productive sputum, cough, dyspnea and wheezing.  Gastrointestinal: Denied, gastro-esophageal reflux, melena, nausea and vomiting.  Genitourinary: Denied genitourinary symptoms including symptomatic vaginal discharge, pelvic relaxation issues, and urinary complaints.  Musculoskeletal: Denied musculoskeletal symptoms, stiffness, swelling, muscle weakness and myalgia.  Dermatologic: Denied dermatology symptoms, rash and scar.  Neurologic: Denied neurology symptoms, dizziness, headache, neck pain and syncope.  Psychiatric: Denied psychiatric symptoms, anxiety and depression.  Endocrine: Denied endocrine symptoms including hot flashes and night sweats.   Meds:   Current Outpatient Medications on File Prior to Visit  Medication Sig Dispense Refill  . albuterol (VENTOLIN HFA) 108 (90 Base) MCG/ACT inhaler Inhale 2 puffs into the lungs every 6 (six) hours as needed for wheezing or shortness of breath. 8 g 2  . ondansetron (ZOFRAN ODT) 4 MG disintegrating tablet Take 1 tablet (4 mg total) by mouth every 8 (eight) hours as needed for nausea or vomiting. 20 tablet 0  . ondansetron (ZOFRAN) 4 MG tablet Take 1 tablet (4 mg total) by mouth daily as needed. 14 tablet 0  . oxyCODONE-acetaminophen (PERCOCET/ROXICET) 5-325 MG tablet Take 1-2 tablets by mouth every 4 (four) hours as needed for moderate pain. 15 tablet 0  . sertraline (ZOLOFT) 50 MG tablet TAKE 1 TABLET BY MOUTH EVERY DAY (Patient taking differently: Take 50 mg by mouth every evening.) 30 tablet 4   No current facility-administered medications on file prior to visit.  Objective:     Vitals:   04/20/20 1114  BP: 108/76  Pulse: 81   Filed Weights   04/20/20 1114  Weight: 211 lb 4.8 oz (95.8 kg)              CT and ultrasound results discussed and reviewed with the patient.  Assessment:    G3P1001 Patient Active Problem List   Diagnosis Date Noted  . Postoperative state 03/13/2020  . History of ovarian cyst 02/15/2020  . Gallstones 01/12/2018  . Family history of cardiac disorder 01/08/2018  . RUQ pain 01/08/2018  . 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 Syncope 01/06/2018  . BMI 33.0-33.9,adult 07/09/2017  . Hx of migraine headaches 07/09/2017  . Endometriosis determined by laparoscopy 01/30/2016  . Pelvic pain 01/17/2016     1. Pelvic pain in female   2. Post-operative state     Pain likely secondary to small seroma in the pelvis status post surgery.   Plan:            1. expectant management of seroma.  It is currently getting better every day and my expectation is that this will continue and then will resolve without further therapy.  2. pelvic ultrasound prior to next visit.  3. postop visit with pelvic exam next visit. Orders Orders Placed This Encounter  Procedures  . US PELVIS (TRANSABDOMINAL ONLY)    No orders of the defined types were placed in this encounter.     F/U  Return in about 2 weeks (around 05/04/2020).  Elonda Husky, M.D. 04/20/2020 11:44 AM

## 2020-04-25 ENCOUNTER — Encounter: Payer: BC Managed Care – PPO | Admitting: Obstetrics and Gynecology

## 2020-05-04 ENCOUNTER — Encounter: Payer: Self-pay | Admitting: Obstetrics and Gynecology

## 2020-05-04 ENCOUNTER — Ambulatory Visit (INDEPENDENT_AMBULATORY_CARE_PROVIDER_SITE_OTHER): Payer: BC Managed Care – PPO

## 2020-05-04 ENCOUNTER — Ambulatory Visit (INDEPENDENT_AMBULATORY_CARE_PROVIDER_SITE_OTHER): Payer: BC Managed Care – PPO | Admitting: Obstetrics and Gynecology

## 2020-05-04 ENCOUNTER — Other Ambulatory Visit: Payer: Self-pay

## 2020-05-04 VITALS — BP 139/84 | HR 74 | Resp 16 | Ht 64.0 in | Wt 207.5 lb

## 2020-05-04 DIAGNOSIS — Z9889 Other specified postprocedural states: Secondary | ICD-10-CM

## 2020-05-04 DIAGNOSIS — R102 Pelvic and perineal pain: Secondary | ICD-10-CM

## 2020-05-04 NOTE — Progress Notes (Signed)
HPI:      Ms. TANVEER DOBBERSTEIN is a 25 y.o. G3P1001 who LMP was Patient's last menstrual period was 02/29/2020.  Subjective:   She presents today approximately 6 weeks after her surgery LAVH with anterior repair.  Postoperatively the patient developed a small seroma behind the bladder.  She reports that the pain is significantly decreased from when she went to the emergency department for this problem. She had an ultrasound today -see below. She states that she is otherwise doing well.  With no other issues.  She states that she cannot hold her urine longer and is not leaking any.    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 She does not have any pertinent problems on file. She  has a past surgical history that includes No past surgeries; Laparoscopic ovarian cystectomy (Left, 01/22/2016); Cholecystectomy; Loop recorder implant; Laparoscopic assisted vaginal hysterectomy (N/A, 03/13/2020); Laparoscopic bilateral salpingectomy (Bilateral, 03/13/2020); Cystocele repair (N/A, 03/13/2020); and Abdominal hysterectomy. Her family history includes Depression in her mother; Heart disease in her father. She  reports that she has never smoked. She has never used smokeless tobacco. She reports that she does not drink alcohol and does not use drugs. She has a current medication list which includes the following prescription(s): sertraline, albuterol, ondansetron, ondansetron, and oxycodone-acetaminophen. She is allergic to augmentin [amoxicillin-pot clavulanate], hydrocodone-acetaminophen, and metoclopramide.       Review of Systems:  Review of Systems  Constitutional: Denied constitutional symptoms, night sweats, recent illness, fatigue, fever,  insomnia and weight loss.  Eyes: Denied eye symptoms, eye pain, photophobia, vision change and visual disturbance.  Ears/Nose/Throat/Neck: Denied ear, nose, throat or neck symptoms, hearing loss, nasal discharge, sinus congestion and sore throat.  Cardiovascular: Denied cardiovascular symptoms, arrhythmia, chest pain/pressure, edema, exercise intolerance, orthopnea and palpitations.  Respiratory: Denied pulmonary symptoms, asthma, pleuritic pain, productive sputum, cough, dyspnea and wheezing.  Gastrointestinal: Denied, gastro-esophageal reflux, melena, nausea and vomiting.  Genitourinary: Denied genitourinary symptoms including symptomatic vaginal discharge, pelvic relaxation issues, and urinary complaints.  Musculoskeletal: Denied musculoskeletal symptoms, stiffness, swelling, muscle weakness and myalgia.  Dermatologic: Denied dermatology symptoms, rash and scar.  Neurologic: Denied neurology symptoms, dizziness, headache, neck pain and syncope.  Psychiatric: Denied psychiatric symptoms, anxiety and depression.  Endocrine: Denied endocrine symptoms including hot flashes and night sweats.   Meds:   Current Outpatient Medications on File Prior to Visit  Medication Sig Dispense Refill  . sertraline (ZOLOFT) 50 MG tablet TAKE 1 TABLET BY MOUTH EVERY DAY (Patient taking differently: Take 50 mg by mouth every evening.) 30 tablet 4  . albuterol (VENTOLIN HFA) 108 (90 Base) MCG/ACT inhaler Inhale 2 puffs into the lungs every 6 (six) hours as needed for wheezing or shortness of breath. (Patient not taking: Reported on 05/04/2020) 8 g 2  . ondansetron (ZOFRAN ODT) 4 MG disintegrating tablet Take 1 tablet (4 mg total) by mouth every 8 (eight) hours as needed for nausea or vomiting. (Patient not taking: Reported on 05/04/2020) 20 tablet 0  . ondansetron (ZOFRAN) 4 MG tablet Take 1 tablet (4 mg total) by mouth daily as needed. (Patient not taking: Reported on 05/04/2020) 14 tablet 0  . oxyCODONE-acetaminophen  (PERCOCET/ROXICET) 5-325 MG tablet Take 1-2 tablets by mouth every 4 (four)  hours as needed for moderate pain. (Patient not taking: Reported on 05/04/2020) 15 tablet 0   No current facility-administered medications on file prior to visit.          Objective:     Vitals:   05/04/20 1138  BP: 139/84  Pulse: 74  Resp: 16  SpO2: 98%   Filed Weights   05/04/20 1138  Weight: 207 lb 8 oz (94.1 kg)          Abdomen: Soft.  Non-tender.  No masses.  No HSM.  Incision/s: Intact.  Healing well.  No erythema.  No drainage.    Pelvic:   Vulva: Normal appearance.  No lesions.  Vagina: No lesions or abnormalities noted.  Support: Normal pelvic support.  Urethra No masses tenderness or scarring.  Meatus Normal size without lesions or prolapse  Vag Cuff: Intact.  No lesions.  Anus: Normal exam.  No lesions.  Perineum: Normal exam.  No lesions.        Bimanual   Adnexae: No masses.  Non-tender to palpation.  Cuff: Negative for abnormality.      Assessment:    G3P1001 Patient Active Problem List   Diagnosis Date Noted  . Postoperative state 03/13/2020  . History of ovarian cyst 02/15/2020  . Gallstones 01/12/2018  . Family history of cardiac disorder 01/08/2018  . RUQ pain 01/08/2018  . 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 Syncope 01/06/2018  . BMI 33.0-33.9,adult 07/09/2017  . Hx of migraine headaches 07/09/2017  . Endometriosis determined by laparoscopy 01/30/2016  . Pelvic pain 01/17/2016     1. Post-operative state   2. Pelvic pain in female     Patient now doing very well postop.  Vagina healed up well.  Ultrasound shows complete resolution of seroma.   Plan:            1.  Patient may resume normal activities with exception of heavy lifting. Orders No orders of the defined types were placed in this encounter.   No orders of the defined types were placed in this encounter.     F/U  Return in about 3 months (around 08/01/2020).  Elonda Husky,  M.D. 05/04/2020 12:18 PM

## 2020-07-15 ENCOUNTER — Other Ambulatory Visit: Payer: Self-pay | Admitting: Certified Nurse Midwife

## 2020-08-08 ENCOUNTER — Other Ambulatory Visit: Payer: Self-pay | Admitting: Certified Nurse Midwife

## 2020-08-08 ENCOUNTER — Encounter: Payer: BC Managed Care – PPO | Admitting: Obstetrics and Gynecology

## 2020-08-09 ENCOUNTER — Encounter: Payer: Self-pay | Admitting: Obstetrics and Gynecology

## 2020-08-15 ENCOUNTER — Ambulatory Visit (INDEPENDENT_AMBULATORY_CARE_PROVIDER_SITE_OTHER): Payer: BC Managed Care – PPO | Admitting: Obstetrics and Gynecology

## 2020-08-15 ENCOUNTER — Encounter: Payer: Self-pay | Admitting: Obstetrics and Gynecology

## 2020-08-15 ENCOUNTER — Other Ambulatory Visit: Payer: Self-pay

## 2020-08-15 VITALS — BP 132/69 | HR 85 | Wt 207.7 lb

## 2020-08-15 DIAGNOSIS — N940 Mittelschmerz: Secondary | ICD-10-CM

## 2020-08-15 MED ORDER — LEVONORGEST-ETH ESTRAD 91-DAY 0.15-0.03 &0.01 MG PO TABS: 1.0000 | ORAL_TABLET | Freq: Every day | ORAL | 1 refills | Status: DC

## 2020-08-15 NOTE — Progress Notes (Signed)
HPI:      Stephanie Hampton is a 25 y.o. G3P1001 who LMP was Stephanie Hampton's last menstrual period was 02/29/2020.  Subjective:   She presents today saying that she is generally doing well.  She does describe a cyclic pain every month that starts out rather sharp and then becomes dull over a couple days.  She has tried ibuprofen and Tylenol with some success but not complete success.  She denies other issues.    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 She does not have any pertinent problems on file. She  has a past surgical history that includes No past surgeries; Laparoscopic ovarian cystectomy (Left, 01/22/2016); Cholecystectomy; Loop recorder implant; Laparoscopic assisted vaginal hysterectomy (N/A, 03/13/2020); Laparoscopic bilateral salpingectomy (Bilateral, 03/13/2020); Cystocele repair (N/A, 03/13/2020); and Abdominal hysterectomy. Her family history includes Depression in her mother; Heart disease in her father. She  reports that she has never smoked. She has never used smokeless tobacco. She reports that she does not drink alcohol and does not use drugs. She has a current medication list which includes the following prescription(s): levonorgestrel-ethinyl estradiol and sertraline. She is allergic to augmentin [amoxicillin-pot clavulanate], hydrocodone-acetaminophen, and metoclopramide.       Review of Systems:  Review of Systems  Constitutional: Denied constitutional symptoms, night sweats, recent illness, fatigue, fever, insomnia and weight loss.  Eyes: Denied eye symptoms, eye pain, photophobia, vision change and visual disturbance.  Ears/Nose/Throat/Neck: Denied ear, nose, throat or neck symptoms, hearing loss, nasal  discharge, sinus congestion and sore throat.  Cardiovascular: Denied cardiovascular symptoms, arrhythmia, chest pain/pressure, edema, exercise intolerance, orthopnea and palpitations.  Respiratory: Denied pulmonary symptoms, asthma, pleuritic pain, productive sputum, cough, dyspnea and wheezing.  Gastrointestinal: Denied, gastro-esophageal reflux, melena, nausea and vomiting.  Genitourinary: See HPI for additional information.  Musculoskeletal: Denied musculoskeletal symptoms, stiffness, swelling, muscle weakness and myalgia.  Dermatologic: Denied dermatology symptoms, rash and scar.  Neurologic: Denied neurology symptoms, dizziness, headache, neck pain and syncope.  Psychiatric: Denied psychiatric symptoms, anxiety and depression.  Endocrine: Denied endocrine symptoms including hot flashes and night sweats.   Meds:   Current Outpatient Medications on File Prior to Visit  Medication Sig Dispense Refill  . sertraline (ZOLOFT) 50 MG tablet TAKE 1 TABLET BY MOUTH EVERY DAY 90 tablet 1   No current facility-administered medications on file prior to visit.          Objective:     Vitals:   08/15/20 0845  BP: 132/69  Pulse: 85   Filed Weights   08/15/20 0845  Weight: 207 lb 11.2 oz (94.2 kg)                Assessment:    G3P1001 Stephanie Hampton Active Problem List   Diagnosis Date Noted  . Postoperative state 03/13/2020  . History of ovarian cyst 02/15/2020  . Gallstones 01/12/2018  . Family history of cardiac disorder 01/08/2018  . RUQ pain 01/08/2018  . 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 Syncope 01/06/2018  . BMI 33.0-33.9,adult 07/09/2017  . Hx of migraine headaches 07/09/2017  . Endometriosis determined by laparoscopy 01/30/2016  . Pelvic pain 01/17/2016     1. Ovulation pain     Based on her cyclic pain and the type  of pain it seems as if she is experiencing mittelschmerz.   Plan:            1.  We will attempt OCPs as a trial as they should prevent  ovulation and thus ovulatory pain. Velencia has agreed to contact us and tell us if the pills have worked or not. Orders No orders of the defined types were placed in this encounter.    Meds ordered this encounter  Medications  . Levonorgestrel-Ethinyl Estradiol (AMETHIA) 0.15-0.03 &0.01 MG tablet    Sig: Take 1 tablet by mouth at bedtime.    Dispense:  84 tablet    Refill:  1      F/U  Return in about 3 months (around 11/15/2020). I spent 21 minutes involved in the care of this Stephanie Hampton preparing to see the Stephanie Hampton by obtaining and reviewing her medical history (including labs, imaging tests and prior procedures), documenting clinical information in the electronic health record (EHR), counseling and coordinating care plans, writing and sending prescriptions, ordering tests or procedures and directly communicating with the Stephanie Hampton by discussing pertinent items from her history and physical exam as well as detailing my assessment and plan as noted above so that she has an informed understanding.  All of her questions were answered.  Elonda Husky, M.D. 08/15/2020 9:21 AM

## 2021-02-14 ENCOUNTER — Other Ambulatory Visit: Payer: Self-pay | Admitting: Obstetrics and Gynecology

## 2021-02-14 DIAGNOSIS — N940 Mittelschmerz: Secondary | ICD-10-CM

## 2021-02-16 NOTE — Telephone Encounter (Signed)
The OCP does not need to be refilled. Patient states that the medication did not help control ovulation pain. She would like to know what's next.

## 2021-04-18 ENCOUNTER — Other Ambulatory Visit: Payer: Self-pay | Admitting: Certified Nurse Midwife

## 2021-05-06 ENCOUNTER — Other Ambulatory Visit: Payer: Self-pay

## 2021-05-06 ENCOUNTER — Emergency Department
Admission: EM | Admit: 2021-05-06 | Discharge: 2021-05-06 | Disposition: A | Discharge status: Home / Self Care | Payer: BC Managed Care – PPO | Attending: Emergency Medicine | Admitting: Emergency Medicine

## 2021-05-06 ENCOUNTER — Emergency Department: Payer: BC Managed Care – PPO

## 2021-05-06 DIAGNOSIS — G43819 Other migraine, intractable, without status migrainosus: Secondary | ICD-10-CM | POA: Diagnosis not present

## 2021-05-06 DIAGNOSIS — R112 Nausea with vomiting, unspecified: Secondary | ICD-10-CM | POA: Diagnosis present

## 2021-05-06 DIAGNOSIS — Z20822 Contact with and (suspected) exposure to covid-19: Secondary | ICD-10-CM | POA: Diagnosis not present

## 2021-05-06 LAB — CBC WITH DIFFERENTIAL/PLATELET
Abs Immature Granulocytes: 0.01 10*3/uL (ref 0.00–0.07)
Basophils Absolute: 0 10*3/uL (ref 0.0–0.1)
Basophils Relative: 0 %
Eosinophils Absolute: 0.2 10*3/uL (ref 0.0–0.5)
Eosinophils Relative: 2 %
HCT: 41.2 % (ref 36.0–46.0)
Hemoglobin: 13.7 g/dL (ref 12.0–15.0)
Immature Granulocytes: 0 %
Lymphocytes Relative: 13 %
Lymphs Abs: 1.2 10*3/uL (ref 0.7–4.0)
MCH: 29.5 pg (ref 26.0–34.0)
MCHC: 33.3 g/dL (ref 30.0–36.0)
MCV: 88.8 fL (ref 80.0–100.0)
Monocytes Absolute: 0.5 10*3/uL (ref 0.1–1.0)
Monocytes Relative: 6 %
Neutro Abs: 6.7 10*3/uL (ref 1.7–7.7)
Neutrophils Relative %: 79 %
Platelets: 253 10*3/uL (ref 150–400)
RBC: 4.64 MIL/uL (ref 3.87–5.11)
RDW: 12.9 % (ref 11.5–15.5)
WBC: 8.6 10*3/uL (ref 4.0–10.5)
nRBC: 0 % (ref 0.0–0.2)

## 2021-05-06 LAB — RESP PANEL BY RT-PCR (FLU A&B, COVID) ARPGX2
Influenza A by PCR: NEGATIVE
Influenza B by PCR: NEGATIVE
SARS Coronavirus 2 by RT PCR: NEGATIVE

## 2021-05-06 LAB — BASIC METABOLIC PANEL
Anion gap: 4 — ABNORMAL LOW (ref 5–15)
BUN: 10 mg/dL (ref 6–20)
CO2: 24 mmol/L (ref 22–32)
Calcium: 8.6 mg/dL — ABNORMAL LOW (ref 8.9–10.3)
Chloride: 107 mmol/L (ref 98–111)
Creatinine, Ser: 0.63 mg/dL (ref 0.44–1.00)
GFR, Estimated: >60 mL/min (ref >60)
Glucose, Bld: 107 mg/dL — ABNORMAL HIGH (ref 70–99)
Potassium: 4.5 mmol/L (ref 3.5–5.1)
Sodium: 135 mmol/L (ref 135–145)

## 2021-05-06 LAB — HEPATIC FUNCTION PANEL
ALT: 18 U/L (ref 0–44)
AST: 20 U/L (ref 15–41)
Albumin: 3.8 g/dL (ref 3.5–5.0)
Alkaline Phosphatase: 69 U/L (ref 38–126)
Bilirubin, Direct: 0.3 mg/dL — ABNORMAL HIGH (ref 0.0–0.2)
Indirect Bilirubin: 0.9 mg/dL (ref 0.3–0.9)
Total Bilirubin: 1.2 mg/dL (ref 0.3–1.2)
Total Protein: 7.2 g/dL (ref 6.5–8.1)

## 2021-05-06 LAB — MAGNESIUM: Magnesium: 2 mg/dL (ref 1.7–2.4)

## 2021-05-06 MED ORDER — BUTALBITAL-APAP-CAFFEINE 50-325-40 MG PO TABS: 1.0000 | ORAL_TABLET | Freq: Four times a day (QID) | ORAL | 0 refills | Status: AC | PRN

## 2021-05-06 MED ORDER — SODIUM CHLORIDE 0.9 % IV SOLN
12.5000 mg | Freq: Four times a day (QID) | INTRAVENOUS | Status: DC | PRN
  Administered 2021-05-06: 12.5 mg via INTRAVENOUS
  Filled 2021-05-06 (×2): qty 0.5

## 2021-05-06 MED ORDER — ONDANSETRON 4 MG PO TBDP: 4.0000 mg | ORAL_TABLET | Freq: Three times a day (TID) | ORAL | 0 refills | Status: AC | PRN

## 2021-05-06 MED ORDER — SODIUM CHLORIDE 0.9 % IV BOLUS
1000.0000 mL | Freq: Once | INTRAVENOUS | Status: AC
Start: 2021-05-06 — End: 2021-05-06
  Administered 2021-05-06: 1000 mL via INTRAVENOUS

## 2021-05-06 MED ORDER — ACETAMINOPHEN 500 MG PO TABS
1000.0000 mg | ORAL_TABLET | Freq: Once | ORAL | Status: AC
  Administered 2021-05-06: 1000 mg via ORAL
  Filled 2021-05-06: qty 2

## 2021-05-06 MED ORDER — KETOROLAC TROMETHAMINE 15 MG/ML IJ SOLN
15.0000 mg | Freq: Once | INTRAMUSCULAR | Status: AC
  Administered 2021-05-06: 15 mg via INTRAVENOUS
  Filled 2021-05-06 (×2): qty 1

## 2021-05-06 NOTE — ED Triage Notes (Addendum)
Pt to ED [facility] acute onset N/V 0800 this morning, ate fast food last night. Denies diarrhea  EMS VS: CBG 116, HR went from 80 to 120 from sitting to standing, other VS normal  8mg  zofran 22 L AC 250 fluid  Pt alert and oriented

## 2021-05-06 NOTE — ED Provider Notes (Addendum)
[facility] Provider Note    Event Date/Time   First MD Initiated Contact with Patient 05/06/21 1008     (approximate)   History   Emesis and Diarrhea   HPI  Stephanie Hampton is a 26 y.o. female   with history of syncope who is followed by cardiology due to family history of restrictive cardiomyopathy who comes in with nausea vomiting but denies any diarrhea that started 8 AM this morning.  Patient reportedly ate fast food last night.  Patient's sugar was normal but heart rate went from elevated with sitting to standing.  Patient was already given 8 of Zofran and 250 fluid.  Patient reports that she also is having a headache as well.  She reports having a history of migraines that are in the front of her head and sometimes she gets associated nausea and vomiting with it.  She reports that this migraine feels similar to her priors.  She denies any abdominal pain or any other symptoms  Physical Exam   Triage Vital Signs: ED Triage Vitals  Enc Vitals Group     BP 05/06/21 1005 106/60     Pulse Rate 05/06/21 1005 78     Resp 05/06/21 1005 16     Temp --      Temp src --      SpO2 --      Weight 05/06/21 1006 200 lb (90.7 kg)     Height 05/06/21 1006 5\' 3"  (1.6 m)     Head Circumference --      Peak Flow --      Pain Score 05/06/21 1005 8     Pain Loc --      Pain Edu? --      Excl. in GC? --     Most recent vital signs: Vitals:   05/06/21 1005  BP: 106/60  Pulse: 78  Resp: 16     General: Awake, no distress.  CV:  Good peripheral perfusion.  Resp:  Normal effort.  Abd:  No distention.  Other:  Pupils equal and reactive.  EOMI Cram nerves appear intact.  Equal strength in arms and legs.   ED Results / Procedures / Treatments   Labs (all labs ordered are listed, but only abnormal results are displayed) Labs Reviewed  BASIC METABOLIC PANEL - Abnormal; Notable for the following components:      Result Value   Glucose, Bld 107 (*)     Calcium 8.6 (*)    Anion gap 4 (*)    All other components within normal limits  HEPATIC FUNCTION PANEL - Abnormal; Notable for the following components:   Bilirubin, Direct 0.3 (*)    All other components within normal limits  RESP PANEL BY RT-PCR (FLU A&B, COVID) ARPGX2  CBC WITH DIFFERENTIAL/PLATELET  MAGNESIUM     EKG  My interpretation of EKG:  Normal sinus rhythm 78 without any ST elevation or T wave inversions except for aVL, normal intervals  RADIOLOGY I have reviewed the CT personally and agree with radiology read CT head negative    PROCEDURES:  Critical Care performed: No  .1-3 Lead EKG Interpretation Performed by: 07/04/21, MD Authorized by: Concha Se, MD     Interpretation: normal     ECG rate:  70   ECG rate assessment: normal     Rhythm: sinus rhythm     Ectopy: none     Conduction: normal     MEDICATIONS ORDERED  IN ED: Medications  promethazine (PHENERGAN) 12.5 mg in sodium chloride 0.9 % 50 mL IVPB (0 mg Intravenous Stopped 05/06/21 1229)  sodium chloride 0.9 % bolus 1,000 mL (0 mLs Intravenous Stopped 05/06/21 1325)  ketorolac (TORADOL) 15 MG/ML injection 15 mg (15 mg Intravenous Given 05/06/21 1214)  acetaminophen (TYLENOL) tablet 1,000 mg (1,000 mg Oral Given 05/06/21 1327)     IMPRESSION / MDM / ASSESSMENT AND PLAN / ED COURSE  I reviewed the triage vital signs and the nursing notes.                              Differential diagnosis includes, but is not limited to, migraine, COVID, flu, unlikely pregnancy given patient's had a hysterectomy  Hepatic function test are normal Mag is normal COVID, flu are negative BMP is normal CBC is normal  On repeat evaluation patient is feeling much better but now she stated the headache did wake her up from sleep and she normally gets nauseous with it but denies any vomiting.  She had prior MRI of her brain that was over 5 years ago when I reviewed her records.  We discussed doing some repeat CT  imaging just to make sure no mass or other acute pathology but if this is negative I suspect this is most likely migraine.  Patient feeling much better and feels comfortable with being discharged home.  We will give some Fioricet understands that there are Tylenol in it not to drive on this as well as a few more Zofran.  We will give her neurology's number to follow-up outpatient.  Patient is tolerating p.o. and feels comfortable with discharge home   The patient is on the cardiac monitor to evaluate for evidence of arrhythmia and/or significant heart rate changes.  FINAL CLINICAL IMPRESSION(S) / ED DIAGNOSES   Final diagnoses:  Other migraine without status migrainosus, intractable     Rx / DC Orders   ED Discharge Orders          Ordered    ondansetron (ZOFRAN-ODT) 4 MG disintegrating tablet  Every 8 hours PRN        05/06/21 1414    butalbital-acetaminophen-caffeine (FIORICET) 50-325-40 MG tablet  Every 6 hours PRN        05/06/21 1414             Note:  This document was prepared using Dragon voice recognition software and may include unintentional dictation errors.   Concha Se, MD 05/06/21 1415    Concha Se, MD 05/06/21 [phone]

## 2021-05-06 NOTE — Discharge Instructions (Signed)
Take ibuprofen 600 every 6-8 hours.  Do not take additional Tylenol given the Fioricet already has Tylenol in it.  Use this for breakthrough pain.  Do not drive or work on this because it can cause sedation.  Take the Zofran to help with any nausea.  Call the number above to get follow-up with neurology and return to the ER if symptoms are worsening or you have any other concern

## 2021-05-16 ENCOUNTER — Other Ambulatory Visit: Payer: Self-pay | Admitting: Obstetrics and Gynecology

## 2022-01-04 ENCOUNTER — Other Ambulatory Visit: Payer: Self-pay

## 2022-01-04 ENCOUNTER — Emergency Department
Admission: EM | Admit: 2022-01-04 | Discharge: 2022-01-05 | Disposition: A | Discharge status: Home / Self Care | Payer: BC Managed Care – PPO | Attending: Emergency Medicine | Admitting: Emergency Medicine

## 2022-01-04 ENCOUNTER — Emergency Department: Payer: BC Managed Care – PPO

## 2022-01-04 DIAGNOSIS — R1310 Dysphagia, unspecified: Secondary | ICD-10-CM

## 2022-01-04 DIAGNOSIS — R079 Chest pain, unspecified: Secondary | ICD-10-CM

## 2022-01-04 DIAGNOSIS — K449 Diaphragmatic hernia without obstruction or gangrene: Secondary | ICD-10-CM

## 2022-01-04 DIAGNOSIS — R0789 Other chest pain: Secondary | ICD-10-CM | POA: Insufficient documentation

## 2022-01-04 LAB — CBC WITH DIFFERENTIAL/PLATELET
Abs Immature Granulocytes: 0.03 10*3/uL (ref 0.00–0.07)
Basophils Absolute: 0 10*3/uL (ref 0.0–0.1)
Basophils Relative: 0 %
Eosinophils Absolute: 0.4 10*3/uL (ref 0.0–0.5)
Eosinophils Relative: 4 %
HCT: 43.9 % (ref 36.0–46.0)
Hemoglobin: 14.4 g/dL (ref 12.0–15.0)
Immature Granulocytes: 0 %
Lymphocytes Relative: 24 %
Lymphs Abs: 2.7 10*3/uL (ref 0.7–4.0)
MCH: 28.8 pg (ref 26.0–34.0)
MCHC: 32.8 g/dL (ref 30.0–36.0)
MCV: 87.8 fL (ref 80.0–100.0)
Monocytes Absolute: 0.8 10*3/uL (ref 0.1–1.0)
Monocytes Relative: 7 %
Neutro Abs: 7.4 10*3/uL (ref 1.7–7.7)
Neutrophils Relative %: 65 %
Platelets: 299 10*3/uL (ref 150–400)
RBC: 5 MIL/uL (ref 3.87–5.11)
RDW: 12.8 % (ref 11.5–15.5)
WBC: 11.3 10*3/uL — ABNORMAL HIGH (ref 4.0–10.5)
nRBC: 0 % (ref 0.0–0.2)

## 2022-01-04 LAB — COMPREHENSIVE METABOLIC PANEL
ALT: 22 U/L (ref 0–44)
AST: 18 U/L (ref 15–41)
Albumin: 4.7 g/dL (ref 3.5–5.0)
Alkaline Phosphatase: 80 U/L (ref 38–126)
Anion gap: 6 (ref 5–15)
BUN: 11 mg/dL (ref 6–20)
CO2: 26 mmol/L (ref 22–32)
Calcium: 9.4 mg/dL (ref 8.9–10.3)
Chloride: 103 mmol/L (ref 98–111)
Creatinine, Ser: 0.73 mg/dL (ref 0.44–1.00)
GFR, Estimated: >60 mL/min (ref >60)
Glucose, Bld: 96 mg/dL (ref 70–99)
Potassium: 3.6 mmol/L (ref 3.5–5.1)
Sodium: 135 mmol/L (ref 135–145)
Total Bilirubin: 0.6 mg/dL (ref 0.3–1.2)
Total Protein: 8.6 g/dL — ABNORMAL HIGH (ref 6.5–8.1)

## 2022-01-04 LAB — TROPONIN I (HIGH SENSITIVITY): Troponin I (High Sensitivity): <2 ng/L (ref <18)

## 2022-01-04 MED ORDER — SUCRALFATE 1 G PO TABS: 1.0000 g | ORAL_TABLET | Freq: Four times a day (QID) | ORAL | 1 refills | Status: DC

## 2022-01-04 MED ORDER — ALUM & MAG HYDROXIDE-SIMETH 200-200-20 MG/5ML PO SUSP
30.0000 mL | Freq: Once | ORAL | Status: AC
  Administered 2022-01-04: 30 mL via ORAL
  Filled 2022-01-04: qty 30

## 2022-01-04 MED ORDER — FAMOTIDINE 20 MG PO TABS: 20.0000 mg | ORAL_TABLET | Freq: Two times a day (BID) | ORAL | 0 refills | Status: DC

## 2022-01-04 MED ORDER — LIDOCAINE VISCOUS HCL 2 % MT SOLN
15.0000 mL | Freq: Once | OROMUCOSAL | Status: AC
  Administered 2022-01-04: 15 mL via OROMUCOSAL
  Filled 2022-01-04: qty 15

## 2022-01-04 MED ORDER — FAMOTIDINE 20 MG PO TABS
40.0000 mg | ORAL_TABLET | Freq: Once | ORAL | Status: AC
  Administered 2022-01-04: 40 mg via ORAL
  Filled 2022-01-04: qty 2

## 2022-01-04 MED ORDER — PANTOPRAZOLE SODIUM 40 MG IV SOLR
40.0000 mg | Freq: Once | INTRAVENOUS | Status: AC
Start: 2022-01-04 — End: 2022-01-04
  Administered 2022-01-04: 40 mg via INTRAVENOUS
  Filled 2022-01-04: qty 10

## 2022-01-04 MED ORDER — FAMOTIDINE 20 MG PO TABS: 20.0000 mg | ORAL_TABLET | Freq: Two times a day (BID) | ORAL | 0 refills | Status: AC

## 2022-01-04 NOTE — ED Triage Notes (Signed)
Pt reports trouble swallowing both food and liquids and feels like food is getting stuck in her throat, onset last night at dinner. Airway open, pt speaking clear complete sentences. She reports "it feels like a ton of pressure when I swallow." She also reports chest pressure and back pain that started yesterday as well but prior to trouble swallowing unrelieved with antacids.

## 2022-01-04 NOTE — ED Provider Triage Note (Signed)
  Emergency Medicine Provider Triage Evaluation Note  Stephanie Hampton , a 26 y.o.female,  was evaluated in triage.  Pt complains of dysphagia.  She states that she has been having difficulty swallowing both foods and liquids, saying that food is getting stuck in her throat.  Last night, she additionally states that she has been having chest pressure and back pain as well.  She was referred to a gastroenterologist, states that she would not be able to see them for several weeks.  She is currently endorsing chest/back pain at this time.   Review of Systems  Positive: Chest pain, back pain, dysphagia Negative: Denies fever, abdominal pain, vomiting  Physical Exam   Vitals:   01/04/22 1842  BP: 136/64  Pulse: 86  Resp: 18  Temp: 98.7 F (37.1 C)  SpO2: 100%   Gen:   Awake, appears very uncomfortable. Resp:  Normal effort  MSK:   Moves extremities without difficulty  Other:    Medical Decision Making  Given the patient's initial medical screening exam, the following diagnostic evaluation has been ordered. The patient will be placed in the appropriate treatment space, once one is available, to complete the evaluation and treatment. I have discussed the plan of care with the patient and I have advised the patient that an ED physician or mid-level practitioner will reevaluate their condition after the test results have been received, as the results may give them additional insight into the type of treatment they may need.    Diagnostics: Labs, EKG, CXR  Treatments: none immediately   Teodoro Spray, [place] 01/04/22 1847

## 2022-01-04 NOTE — Discharge Instructions (Addendum)
Start the following medicines daily: Carafate 3 times daily with meals and at bedtime Protonix 40 mg daily Pepcid 20mg  twice daily 2.  You may take Lidocaine every 6 hours as needed for sensation of something stuck in your throat. 3.  Continue liquid diet as directed by your doctor. 4.  Return to the ER for worsening symptoms, persistent vomiting, difficulty breathing or other concerns.

## 2022-01-04 NOTE — ED Provider Notes (Signed)
[facility] Provider Note    Event Date/Time   First MD Initiated Contact with Patient 01/04/22 2241     (approximate)   History   Chief Complaint: Dysphagia and Chest Pain   HPI  Stephanie Hampton is a 26 y.o. female with no significant past medical history who comes ED complaining of central chest pressure for the past 3 days.  Started while in the bathroom getting ready to leave for her child's soccer game, sudden onset, radiating to the back, no shortness of breath diaphoresis or vomiting.  No dizziness or syncope.  Constant since then.  Worse with trying to eat or swallow.  She is status post cholecystectomy.      Physical Exam   Triage Vital Signs: ED Triage Vitals  Enc Vitals Group     BP 01/04/22 1842 136/64     Pulse Rate 01/04/22 1842 86     Resp 01/04/22 1842 18     Temp 01/04/22 1842 98.7 F (37.1 C)     Temp Source 01/04/22 1842 Oral     SpO2 01/04/22 1842 100 %     Weight 01/04/22 1845 226 lb (102.5 kg)     Height 01/04/22 1845 5\' 3"  (1.6 m)     Head Circumference --      Peak Flow --      Pain Score 01/04/22 1845 7     Pain Loc --      Pain Edu? --      Excl. in GC? --     Most recent vital signs: Vitals:   01/04/22 1842 01/04/22 2230  BP: 136/64 128/62  Pulse: 86 72  Resp: 18   Temp: 98.7 F (37.1 C)   SpO2: 100% 100%    General: Awake, no distress.  CV:  Good peripheral perfusion.  Regular rate and rhythm.  Normal distal pulses. Resp:  Normal effort.  Clear to auscultation bilaterally Abd:  No distention.  Soft nontender Other:  No oropharyngeal erythema.  Moist oral mucosa.   ED Results / Procedures / Treatments   Labs (all labs ordered are listed, but only abnormal results are displayed) Labs Reviewed  CBC WITH DIFFERENTIAL/PLATELET - Abnormal; Notable for the following components:      Result Value   WBC 11.3 (*)    All other components within normal limits  COMPREHENSIVE METABOLIC PANEL - Abnormal;  Notable for the following components:   Total Protein 8.6 (*)    All other components within normal limits  LIPASE, BLOOD  TROPONIN I (HIGH SENSITIVITY)  TROPONIN I (HIGH SENSITIVITY)     EKG Interpreted by me Normal sinus rhythm rate of 75.  Rightward axis.  Normal intervals.  Poor R wave progression.  Normal ST segments and T waves.   RADIOLOGY Chest x-ray interpreted by me, appears normal without pneumothorax or consolidation.  Radiology report reviewed.   PROCEDURES:  Procedures   MEDICATIONS ORDERED IN ED: Medications  alum & mag hydroxide-simeth (MAALOX/MYLANTA) 200-200-20 MG/5ML suspension 30 mL (30 mLs Oral Given 01/04/22 2338)  lidocaine (XYLOCAINE) 2 % viscous mouth solution 15 mL (15 mLs Mouth/Throat Given 01/04/22 2338)  pantoprazole (PROTONIX) injection 40 mg (40 mg Intravenous Given 01/04/22 2338)  famotidine (PEPCID) tablet 40 mg (40 mg Oral Given 01/04/22 2337)     IMPRESSION / MDM / ASSESSMENT AND PLAN / ED COURSE  I reviewed the triage vital signs and the nursing notes.  Differential diagnosis includes, but is not limited to, GERD, non-STEMI, pulmonary embolism, anxiety, costochondritis, pleurisy, pneumothorax  Patient's presentation is most consistent with acute presentation with potential threat to life or bodily function.  Patient presents with nonspecific chest pain, rapid onset, constant for a few days.  Exam is reassuring.  Vital signs are normal.  No respiratory distress.  Troponin is negative, EKG unremarkable, chest x-ray nondiagnostic.  Patient is low risk for cardiac event.  We will do trial of antacids, if symptoms improve and she is tolerating oral intake she can continue antacids and follow-up with primary care.  If this approach is unsuccessful, would proceed to CT angiogram of the chest to evaluate for PE.  Care signed out to Dr. Beather Arbour.       FINAL CLINICAL IMPRESSION(S) / ED DIAGNOSES   Final diagnoses:   Nonspecific chest pain     Rx / DC Orders   ED Discharge Orders          Ordered    sucralfate (CARAFATE) 1 g tablet  4 times daily,   Status:  Discontinued        01/04/22 2346    famotidine (PEPCID) 20 MG tablet  2 times daily,   Status:  Discontinued        01/04/22 2346    famotidine (PEPCID) 20 MG tablet  2 times daily        01/04/22 2346    sucralfate (CARAFATE) 1 g tablet  4 times daily        01/04/22 2346             Note:  This document was prepared using Dragon voice recognition software and may include unintentional dictation errors.   Carrie Mew, MD 01/04/22 [phone]

## 2022-01-04 NOTE — ED Notes (Signed)
Report received, this RN now assuming care.  

## 2022-01-05 ENCOUNTER — Emergency Department: Payer: BC Managed Care – PPO

## 2022-01-05 LAB — TROPONIN I (HIGH SENSITIVITY): Troponin I (High Sensitivity): 3 ng/L (ref <18)

## 2022-01-05 LAB — LIPASE, BLOOD: Lipase: 31 U/L (ref 11–51)

## 2022-01-05 MED ORDER — GLUCAGON HCL RDNA (DIAGNOSTIC) 1 MG IJ SOLR
1.0000 mg | Freq: Once | INTRAMUSCULAR | Status: AC
  Administered 2022-01-05: 1 mg via INTRAVENOUS
  Filled 2022-01-05: qty 1

## 2022-01-05 MED ORDER — ONDANSETRON HCL 4 MG/2ML IJ SOLN
4.0000 mg | Freq: Once | INTRAMUSCULAR | Status: AC
  Administered 2022-01-05: 4 mg via INTRAVENOUS
  Filled 2022-01-05: qty 2

## 2022-01-05 MED ORDER — PANTOPRAZOLE SODIUM 40 MG PO TBEC: 40.0000 mg | DELAYED_RELEASE_TABLET | Freq: Every day | ORAL | 0 refills | Status: AC

## 2022-01-05 MED ORDER — LORAZEPAM 2 MG/ML IJ SOLN
1.0000 mg | Freq: Once | INTRAMUSCULAR | Status: AC
  Administered 2022-01-05: 1 mg via INTRAVENOUS
  Filled 2022-01-05: qty 1

## 2022-01-05 MED ORDER — IOHEXOL 350 MG/ML SOLN
75.0000 mL | Freq: Once | INTRAVENOUS | Status: AC | PRN
  Administered 2022-01-05: 75 mL via INTRAVENOUS

## 2022-01-05 MED ORDER — SODIUM CHLORIDE 0.9 % IV BOLUS
1000.0000 mL | Freq: Once | INTRAVENOUS | Status: AC
  Administered 2022-01-05: 1000 mL via INTRAVENOUS

## 2022-01-05 MED ORDER — SUCRALFATE 1 GM/10ML PO SUSP: 1.0000 g | Freq: Four times a day (QID) | ORAL | 1 refills | Status: AC

## 2022-01-05 MED ORDER — NITROGLYCERIN 0.4 MG SL SUBL
0.4000 mg | SUBLINGUAL_TABLET | Freq: Once | SUBLINGUAL | Status: AC
  Administered 2022-01-05: 0.4 mg via SUBLINGUAL
  Filled 2022-01-05: qty 1

## 2022-01-05 MED ORDER — LIDOCAINE VISCOUS HCL 2 % MT SOLN: 15.0000 mL | Freq: Four times a day (QID) | OROMUCOSAL | 0 refills | Status: AC | PRN

## 2022-01-05 NOTE — ED Notes (Signed)
Signing pad did not work, pt verbalized understanding of DC instructions. 

## 2022-01-05 NOTE — ED Notes (Signed)
Patient transported to CT 

## 2022-01-05 NOTE — ED Notes (Signed)
ED Provider at bedside. 

## 2022-01-05 NOTE — ED Provider Notes (Signed)
-----------------------------------------   12:50 AM on 01/05/2022 -----------------------------------------   Patient endorses slight improvement in GERD symptoms but states pain remains.  Endorses sudden pain in thoracic back 2 days ago with subsequent difficulty swallowing.  Try to eat a [place] fry earlier in the day but was not able to swallow it.  Now feels like [place] fry has passed and does not have a feeling of esophageal food bolus.  Patient and spouse are frustrated because they feel like her complaints have not been addressed while she has been in the ED.  Will obtain CTA chest to evaluate for PE and other etiology for dysphagia.  Administer sublingual nitroglycerin, IV glucagon/Zofran, IV Ativan for smooth muscle relaxation and symptomatic relief of symptoms.  Administer IV fluids and reassess.   ----------------------------------------- 2:15 AM on 01/05/2022 -----------------------------------------   Patient sleeping soundly in no acute distress.  Patient and spouse on CT imaging results demonstrating no PE but small hiatal hernia.  Patient overall feeling better.  Will discharge home on Carafate, lidocaine as needed and Protonix.  Patient for follow-up with GI as an outpatient.  Strict return precautions given.  Patient and spouse verbalized understanding agree with plan of care.   Paulette Blanch, MD 01/05/22 [phone]

## 2022-01-08 ENCOUNTER — Encounter: Payer: Self-pay | Admitting: Internal Medicine

## 2022-01-09 ENCOUNTER — Ambulatory Visit: Payer: BC Managed Care – PPO | Admitting: Anesthesiology

## 2022-01-09 ENCOUNTER — Encounter: Payer: Self-pay | Admitting: Internal Medicine

## 2022-01-09 ENCOUNTER — Ambulatory Visit
Admission: RE | Admit: 2022-01-09 | Discharge: 2022-01-09 | Disposition: A | Discharge status: Home / Self Care | Payer: BC Managed Care – PPO | Attending: Internal Medicine | Admitting: Internal Medicine

## 2022-01-09 ENCOUNTER — Encounter
Admission: RE | Disposition: A | Discharge status: Home / Self Care | Payer: Self-pay | Source: Home / Self Care | Attending: Internal Medicine

## 2022-01-09 DIAGNOSIS — R1314 Dysphagia, pharyngoesophageal phase: Secondary | ICD-10-CM | POA: Insufficient documentation

## 2022-01-09 DIAGNOSIS — Z79899 Other long term (current) drug therapy: Secondary | ICD-10-CM | POA: Diagnosis not present

## 2022-01-09 DIAGNOSIS — K3189 Other diseases of stomach and duodenum: Secondary | ICD-10-CM | POA: Insufficient documentation

## 2022-01-09 DIAGNOSIS — K2970 Gastritis, unspecified, without bleeding: Secondary | ICD-10-CM | POA: Insufficient documentation

## 2022-01-09 DIAGNOSIS — K2289 Other specified disease of esophagus: Secondary | ICD-10-CM | POA: Insufficient documentation

## 2022-01-09 DIAGNOSIS — K2100 Gastro-esophageal reflux disease with esophagitis, without bleeding: Secondary | ICD-10-CM | POA: Diagnosis not present

## 2022-01-09 DIAGNOSIS — Z6841 Body Mass Index (BMI) 40.0 and over, adult: Secondary | ICD-10-CM | POA: Diagnosis not present

## 2022-01-09 HISTORY — PX: ESOPHAGOGASTRODUODENOSCOPY (EGD) WITH PROPOFOL: SHX5813

## 2022-01-09 SURGERY — ESOPHAGOGASTRODUODENOSCOPY (EGD) WITH PROPOFOL
Anesthesia: General

## 2022-01-09 MED ORDER — PROPOFOL 500 MG/50ML IV EMUL
INTRAVENOUS | Status: DC | PRN
  Administered 2022-01-09: 234.146 ug/kg/min via INTRAVENOUS

## 2022-01-09 MED ORDER — DEXMEDETOMIDINE HCL 200 MCG/2ML IV SOLN
INTRAVENOUS | Status: DC | PRN
  Administered 2022-01-09: 12 ug via INTRAVENOUS

## 2022-01-09 MED ORDER — SODIUM CHLORIDE 0.9 % IV SOLN
INTRAVENOUS | Status: DC
  Administered 2022-01-09: 1000 mL via INTRAVENOUS

## 2022-01-09 MED ORDER — LIDOCAINE HCL (CARDIAC) PF 100 MG/5ML IV SOSY
PREFILLED_SYRINGE | INTRAVENOUS | Status: DC | PRN
  Administered 2022-01-09: 100 mg via INTRAVENOUS

## 2022-01-09 MED ORDER — PROPOFOL 10 MG/ML IV BOLUS
INTRAVENOUS | Status: DC | PRN
  Administered 2022-01-09: 40 mg via INTRAVENOUS
  Administered 2022-01-09: 50 mg via INTRAVENOUS
  Administered 2022-01-09: 130 mg via INTRAVENOUS

## 2022-01-09 MED ORDER — GLYCOPYRROLATE 0.2 MG/ML IJ SOLN
INTRAMUSCULAR | Status: DC | PRN
  Administered 2022-01-09: .2 mg via INTRAVENOUS

## 2022-01-09 MED ORDER — GLYCOPYRROLATE 0.2 MG/ML IJ SOLN
INTRAMUSCULAR | Status: AC
  Filled 2022-01-09: qty 1

## 2022-01-09 MED ORDER — LIDOCAINE HCL (PF) 1 % IJ SOLN
INTRAMUSCULAR | Status: AC
  Filled 2022-01-09: qty 2

## 2022-01-09 NOTE — Interval H&P Note (Signed)
History and Physical Interval Note:  01/09/2022 12:03 PM  Stephanie Hampton  has presented today for surgery, with the diagnosis of dysphagia.  The various methods of treatment have been discussed with the patient and family. After consideration of risks, benefits and other options for treatment, the patient has consented to  Procedure(s): ESOPHAGOGASTRODUODENOSCOPY (EGD) WITH PROPOFOL (N/A) as a surgical intervention.  The patient's history has been reviewed, patient examined, no change in status, stable for surgery.  I have reviewed the patient's chart and labs.  Questions were answered to the patient's satisfaction.     [name]

## 2022-01-09 NOTE — Anesthesia Postprocedure Evaluation (Signed)
Anesthesia Post Note  Patient: TREZURE CRONK  Procedure(s) Performed: ESOPHAGOGASTRODUODENOSCOPY (EGD) WITH PROPOFOL  Patient location during evaluation: Endoscopy Anesthesia Type: General Level of consciousness: awake and alert Pain management: pain level controlled Vital Signs Assessment: post-procedure vital signs reviewed and stable Respiratory status: spontaneous breathing, nonlabored ventilation, respiratory function stable and patient connected to nasal cannula oxygen Cardiovascular status: blood pressure returned to baseline and stable Postop Assessment: no apparent nausea or vomiting Anesthetic complications: no   No notable events documented.   Last Vitals:  Vitals:   01/09/22 1318 01/09/22 1325  BP: (!) 120/51 117/63  Pulse: 82 84  Resp: 15 18  Temp:    SpO2: 100% 100%    Last Pain:  Vitals:   01/09/22 1325  TempSrc:   PainSc: 0-No pain                 Precious Haws Shaniah Baltes

## 2022-01-09 NOTE — Transfer of Care (Signed)
Immediate Anesthesia Transfer of Care Note  Patient: Stephanie Hampton  Procedure(s) Performed: ESOPHAGOGASTRODUODENOSCOPY (EGD) WITH PROPOFOL  Patient Location: Endoscopy Unit  Anesthesia Type:General  Level of Consciousness: drowsy  Airway & Oxygen Therapy: Patient Spontanous Breathing  Post-op Assessment: Report given to RN and Post -op Vital signs reviewed and stable  Post vital signs: Reviewed and stable  Last Vitals:  Vitals Value Taken Time  BP 118/58 01/09/22 1258  Temp    Pulse 94 01/09/22 1258  Resp 20 01/09/22 1258  SpO2 100 % 01/09/22 1258    Last Pain:  Vitals:   01/09/22 1258  TempSrc:   PainSc: 0-No pain         Complications: No notable events documented.

## 2022-01-09 NOTE — H&P (Signed)
Outpatient short stay form Pre-procedure 01/09/2022 12:02 PM Stephanie Hampton [name], M.D.  Primary Physician: [name], M.D.  Reason for visit:  Dysphagia, GERD  History of present illness:  Patient reports acute onset of sharp pain in mid chest with radiation into the back and difficulty swallowing on 01/03/2022 when getting her son ready for soccer. She was not eating. She had a sensation of trapped gas in her chest that radiated pain into the upper back. She tried Gas-X, Alka-Seltzer x2, famotidine without improvement. Several hours later, she tried eating roasted chicken and vegetables and the food seem to hang up in the mid chest area. She vomited it up. She had belching, and flatus. The following day, she continued to have this sensation of trapped gas in her mid chest of trapped gas and she saw her PCP who advised trial of liquids and GI referral. That night, she tried drinking liquids and it seemed to hang up as well.   -01/04/2022: She was seen in the [facility] ED for this midsternal chest pain worse with eating and drinking. Status postcholecystectomy. She had a CXR and CT angio as noted above that revealed a small HH. It was negative for PE, other etiology. She was given a sublingual nitroglycerin, IV glucagon/Zofran, IV Ativan for smooth muscle relaxation and symptomatic relief of symptoms. Patient was discharged home on Carafate, lidocaine as needed and pantoprazole.  Today, she reports a long history of heartburn that started about 10 years ago with pregnancy. She has treated it with occasional Tums and famotidine maybe twice a week. She has noted that rice hangs up in the midsternum over the last year and she does not eat it. This is the first time she has ever had sharp mid sternum pain with pressure radiation into the back. She is taking sucralfate 1 gm tablet x3 daily, pantoprazole 40 mg in the morning, famotidine 20 mg twice daily. She continues to have this sensation in her mid sternum area  that food is hanging up. She still cannot eat solid food today. She is tolerating liquids and drinking smoothies, protein shakes potato soup which is passing down well. No history of antibiotics. History of occasional Excedrin Migraine x1/week.    Current Facility-Administered Medications:    0.9 %  sodium chloride infusion, , Intravenous, Continuous, Kaimana Neuzil, Boykin Nearing, MD  Medications Prior to Admission  Medication Sig Dispense Refill Last Dose   pantoprazole (PROTONIX) 40 MG tablet Take 1 tablet (40 mg total) by mouth daily. 30 tablet 0 01/08/2022   famotidine (PEPCID) 20 MG tablet Take 1 tablet (20 mg total) by mouth 2 (two) times daily. 60 tablet 0    Levonorgestrel-Ethinyl Estradiol (AMETHIA) 0.15-0.03 &0.01 MG tablet Take 1 tablet by mouth at bedtime. (Patient not taking: Reported on 05/06/2021) 84 tablet 1    lidocaine (XYLOCAINE) 2 % solution Use as directed 15 mLs in the mouth or throat every 6 (six) hours as needed for up to 5 days (feeling like something stuck in throat). 100 mL 0    metoprolol tartrate (LOPRESSOR) 25 MG tablet Take 12.5 mg by mouth 2 (two) times daily. (Patient not taking: Reported on 01/09/2022)   Not Taking   nortriptyline (PAMELOR) 50 MG capsule Take 50 mg by mouth at bedtime.      sertraline (ZOLOFT) 50 MG tablet TAKE 1 TABLET BY MOUTH EVERY DAY 90 tablet 2    sucralfate (CARAFATE) 1 GM/10ML suspension Take 10 mLs (1 g total) by mouth 4 (four) times daily. 420  mL 1      Allergies  Allergen Reactions   Augmentin [Amoxicillin-Pot Clavulanate] Hives    Has patient had a PCN reaction causing immediate rash, facial/tongue/throat swelling, SOB or lightheadedness with hypotension: No Has patient had a PCN reaction causing severe rash involving mucus membranes or skin necrosis: No Has patient had a PCN reaction that required hospitalization: No Has patient had a PCN reaction occurring within the last 10 years: Yes If all of the above answers are "NO", then may  proceed with Cephalosporin use.     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    Physical Exam  Gen: Alert, oriented. Appears stated age.  HEENT: [place]/AT. PERRLA. Lungs: CTA, no wheezes. CV: RR nl S1, S2. Abd: soft, benign, no masses. BS+ Ext: No edema. Pulses 2+    Planned procedures: Proceed with EGD. The patient understands the nature of the planned procedure, indications, risks, alternatives and potential complications including but not limited to bleeding, infection, perforation, damage to internal organs and possible oversedation/side effects from anesthesia. The patient agrees and gives consent to proceed.  Please refer to procedure notes for findings, recommendations and patient disposition/instructions.     Dillen Belmontes K. Alice Reichert, M.D. Gastroenterology 01/09/2022  12:02 PM

## 2022-01-09 NOTE — Op Note (Signed)
[facility] Gastroenterology Patient Name: Aerika Groll Procedure Date: 01/09/2022 12:32 PM MRN: [id number] Account #: [id number] Date of Birth: [birth year] Admit Type: Outpatient Age: 26 Room: [facility] ENDO ROOM 2 Gender: Female Note Status: Finalized Instrument Name: Upper Endoscope [id number] Procedure:             Upper GI endoscopy Indications:           Esophageal dysphagia, Heartburn Providers:             Benay Pike. Alice Reichert MD, MD Referring MD:          Benay Pike. Alice Reichert MD, MD (Referring MD), Caprice Renshaw MD (Referring MD) Medicines:             Propofol per Anesthesia Complications:         No immediate complications. Procedure:             Pre-Anesthesia Assessment:                        - The risks and benefits of the procedure and the                         sedation options and risks were discussed with the                         patient. All questions were answered and informed                         consent was obtained.                        - Patient identification and proposed procedure were                         verified prior to the procedure by the nurse. The                         procedure was verified in the procedure room.                        - ASA Grade Assessment: III - A patient with severe                         systemic disease.                        - After reviewing the risks and benefits, the patient                         was deemed in satisfactory condition to undergo the                         procedure.                        After obtaining informed consent, the endoscope was  passed under direct vision. Throughout the procedure,                         the patient's blood pressure, pulse, and oxygen                         saturations were monitored continuously. The Endoscope                         was introduced through the mouth, and advanced to the                          third part of duodenum. The upper GI endoscopy was                         accomplished without difficulty. The patient tolerated                         the procedure well. Findings:      Mucosal changes including feline appearance, longitudinal furrows and       crepe paper esophagus were found in the entire esophagus. Esophageal       findings were graded using the Eosinophilic Esophagitis Endoscopic       Reference Score (EoE-EREFS) as: Edema Grade 0 Normal (distinct vascular       markings), Rings Grade 1 Mild (subtle circumferential ridges seen on       esophageal distension), Exudates Grade 1 Mild (scattered white lesions       involving less than 10 percent of the esophageal surface area), Furrows       Grade 1 Present (vertical lines with or without visible depth) and       Stricture none (no stricture found). Biopsies were obtained from the       proximal and distal esophagus with cold forceps for histology of       suspected eosinophilic esophagitis. The scope was withdrawn. Dilation       was performed with a Maloney dilator with mild resistance at 54 Fr.      Diffuse mildly erythematous mucosa without bleeding was found in the       entire examined stomach. Two biopsies were obtained with cold forceps       for histology in the gastric body.      The examined duodenum was normal.      The exam was otherwise without abnormality. Impression:            - Esophageal mucosal changes suggestive of                         eosinophilic esophagitis. Biopsied. Dilated.                        - Erythematous mucosa in the stomach.                        - Normal examined duodenum.                        - The examination was otherwise normal.                        -  Biopsies performed in the gastric body. Recommendation:        - Patient has a contact number available for                         emergencies. The signs and symptoms of potential                          delayed complications were discussed with the patient.                         Return to normal activities tomorrow. Written                         discharge instructions were provided to the patient.                        - Resume previous diet.                        - Continue present medications.                        - Await pathology results.                        - Return to nurse practitioner as previously scheduled.                        - Follow up with Fransico Setters, NP at [facility]                         Gastroenterology.                        - The findings and recommendations were discussed with                         the patient. Procedure Code(s):     --- Professional ---                        [phone], Esophagogastroduodenoscopy, flexible,                         transoral; with biopsy, single or multiple                        43450, Dilation of esophagus, by unguided sound or                         bougie, single or multiple passes Diagnosis Code(s):     --- Professional ---                        R12, Heartburn                        R13.14, Dysphagia, pharyngoesophageal phase                        K31.89, Other diseases of stomach and duodenum  K22.8, Other specified diseases of esophagus CPT copyright 2019 American Medical Association. All rights reserved. The codes documented in this report are preliminary and upon coder review may  be revised to meet current compliance requirements. Stanton Kidney MD, MD 01/09/2022 12:55:19 PM This report has been signed electronically. Number of Addenda: 0 Note Initiated On: 01/09/2022 12:32 PM Estimated Blood Loss:  Estimated blood loss: none.      [facility]

## 2022-01-09 NOTE — Anesthesia Preprocedure Evaluation (Signed)
Anesthesia Evaluation  Patient identified by MRN, date of birth, ID band Patient awake    Reviewed: Allergy & Precautions, NPO status , Patient's Chart, lab work & pertinent test results  History of Anesthesia Complications (+) PONV and history of anesthetic complications  Airway Mallampati: III  TM Distance: <3 FB Neck ROM: full    Dental  (+) Chipped   Pulmonary neg pulmonary ROS, neg shortness of breath,    Pulmonary exam normal        Cardiovascular Exercise Tolerance: Good (-) anginanegative cardio ROS Normal cardiovascular exam     Neuro/Psych  Neuromuscular disease negative psych ROS   GI/Hepatic Neg liver ROS, GERD  Controlled,  Endo/Other  Morbid obesity  Renal/GU negative Renal ROS  negative genitourinary   Musculoskeletal   Abdominal   Peds  Hematology negative hematology ROS (+)   Anesthesia Other Findings Past Medical History: No date: 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 No date: Depression No date: Endometriosis No date: GERD (gastroesophageal reflux disease) No date: Ovarian cyst  Past Surgical History: No date: ABDOMINAL HYSTERECTOMY No date: CHOLECYSTECTOMY 03/13/2020: CYSTOCELE REPAIR; N/A     Comment:  Procedure: ANTERIOR REPAIR (CYSTOCELE);  Surgeon: Harlin Heys, MD;  Location: [facility] ORS;  Service:               Gynecology;  Laterality: N/A; 03/13/2020: LAPAROSCOPIC ASSISTED VAGINAL HYSTERECTOMY; N/A     Comment:  Procedure: LAPAROSCOPIC ASSISTED VAGINAL HYSTERECTOMY;                Surgeon: Harlin Heys, MD;  Location: [facility] ORS;                Service: Gynecology;  Laterality: N/A; 03/13/2020: LAPAROSCOPIC BILATERAL SALPINGECTOMY; Bilateral     Comment:  Procedure: LAPAROSCOPIC BILATERAL SALPINGECTOMY;                Surgeon: Harlin Heys, MD;  Location: [facility] ORS;                Service: Gynecology;   Laterality: Bilateral; 01/22/2016: LAPAROSCOPIC OVARIAN CYSTECTOMY; Left     Comment:  Procedure: LAPAROSCOPIC LEFT OVARIAN CYSTECTOMY-POSSIBLE              BIOPSIES;  Surgeon: Brayton Mars, MD;  Location:               [facility] ORS;  Service: Gynecology;  Laterality: Left;                excision/fulgeration of endometriosis No date: LOOP RECORDER IMPLANT No date: NO PAST SURGERIES  BMI    Body Mass Index: 41.34 kg/m      Reproductive/Obstetrics negative OB ROS                             Anesthesia Physical Anesthesia Plan  ASA: 3  Anesthesia Plan: General   Post-op Pain Management:    Induction: Intravenous  PONV Risk Score and Plan: Propofol infusion and TIVA  Airway Management Planned: Natural Airway and Nasal Cannula  Additional Equipment:   Intra-op Plan:   Post-operative Plan:   Informed Consent: I have reviewed the patients History and Physical, chart, labs and discussed the procedure including the risks, benefits and alternatives for the proposed anesthesia with the patient or authorized representative who has indicated his/her understanding and acceptance.  Dental Advisory Given  Plan Discussed with: Anesthesiologist, CRNA and Surgeon  Anesthesia Plan Comments: (Patient consented for risks of anesthesia including but not limited to:  - adverse reactions to medications - risk of airway placement if required - damage to eyes, teeth, lips or other oral mucosa - nerve damage due to positioning  - sore throat or hoarseness - Damage to heart, brain, nerves, lungs, other parts of body or loss of life  Patient voiced understanding.)        Anesthesia Quick Evaluation

## 2022-01-10 ENCOUNTER — Encounter: Payer: Self-pay | Admitting: Internal Medicine

## 2022-01-10 LAB — SURGICAL PATHOLOGY

## 2022-01-15 ENCOUNTER — Encounter: Payer: Self-pay | Admitting: Certified Nurse Midwife

## 2023-08-20 ENCOUNTER — Other Ambulatory Visit: Payer: Self-pay

## 2023-08-20 ENCOUNTER — Emergency Department
Admission: EM | Admit: 2023-08-20 | Discharge: 2023-08-20 | Disposition: A | Discharge status: Home / Self Care | Attending: Emergency Medicine | Admitting: Emergency Medicine

## 2023-08-20 ENCOUNTER — Emergency Department

## 2023-08-20 ENCOUNTER — Encounter: Payer: Self-pay | Admitting: Emergency Medicine

## 2023-08-20 DIAGNOSIS — R1010 Upper abdominal pain, unspecified: Secondary | ICD-10-CM | POA: Insufficient documentation

## 2023-08-20 DIAGNOSIS — D72829 Elevated white blood cell count, unspecified: Secondary | ICD-10-CM | POA: Insufficient documentation

## 2023-08-20 LAB — CBC
HCT: 42.2 % (ref 36.0–46.0)
Hemoglobin: 13.9 g/dL (ref 12.0–15.0)
MCH: 29.4 pg (ref 26.0–34.0)
MCHC: 32.9 g/dL (ref 30.0–36.0)
MCV: 89.4 fL (ref 80.0–100.0)
Platelets: 352 10*3/uL (ref 150–400)
RBC: 4.72 MIL/uL (ref 3.87–5.11)
RDW: 13 % (ref 11.5–15.5)
WBC: 11 10*3/uL — ABNORMAL HIGH (ref 4.0–10.5)
nRBC: 0 % (ref 0.0–0.2)

## 2023-08-20 LAB — COMPREHENSIVE METABOLIC PANEL WITH GFR
ALT: 23 U/L (ref 0–44)
AST: 16 U/L (ref 15–41)
Albumin: 4.4 g/dL (ref 3.5–5.0)
Alkaline Phosphatase: 82 U/L (ref 38–126)
Anion gap: 9 (ref 5–15)
BUN: 8 mg/dL (ref 6–20)
CO2: 24 mmol/L (ref 22–32)
Calcium: 9.4 mg/dL (ref 8.9–10.3)
Chloride: 105 mmol/L (ref 98–111)
Creatinine, Ser: 0.67 mg/dL (ref 0.44–1.00)
GFR, Estimated: >60 mL/min (ref >60)
Glucose, Bld: 92 mg/dL (ref 70–99)
Potassium: 4.1 mmol/L (ref 3.5–5.1)
Sodium: 138 mmol/L (ref 135–145)
Total Bilirubin: 0.8 mg/dL (ref 0.0–1.2)
Total Protein: 7.6 g/dL (ref 6.5–8.1)

## 2023-08-20 LAB — LIPASE, BLOOD: Lipase: 39 U/L (ref 11–51)

## 2023-08-20 MED ORDER — IOHEXOL 300 MG/ML  SOLN
100.0000 mL | Freq: Once | INTRAMUSCULAR | Status: AC | PRN
  Administered 2023-08-20: 100 mL via INTRAVENOUS

## 2023-08-20 MED ORDER — KETOROLAC TROMETHAMINE 15 MG/ML IJ SOLN
15.0000 mg | Freq: Once | INTRAMUSCULAR | Status: DC
  Filled 2023-08-20: qty 1

## 2023-08-20 MED ORDER — PANTOPRAZOLE SODIUM 40 MG PO TBEC
40.0000 mg | DELAYED_RELEASE_TABLET | Freq: Every day | ORAL | 0 refills | Status: AC
Start: 2023-08-20 — End: 2023-09-03

## 2023-08-20 MED ORDER — KETOROLAC TROMETHAMINE 60 MG/2ML IM SOLN
30.0000 mg | Freq: Once | INTRAMUSCULAR | Status: AC
  Administered 2023-08-20: 30 mg via INTRAMUSCULAR

## 2023-08-20 MED ORDER — NAPROXEN 500 MG PO TABS
500.0000 mg | ORAL_TABLET | Freq: Two times a day (BID) | ORAL | 0 refills | Status: AC
Start: 2023-08-20 — End: 2023-08-27

## 2023-08-20 NOTE — ED Triage Notes (Signed)
 Patient to ED via POV for upper abd pain x1 week. Diarrhea x2 days, denies N/V.

## 2023-08-20 NOTE — ED Provider Notes (Signed)
 [facility] Provider Note    Event Date/Time   First MD Initiated Contact with Patient 08/20/23 1913     (approximate)   History   Abdominal Pain   HPI Stephanie Hampton is a 28 y.o. female presenting today for abdominal pain.  Patient states for the past 7 days she has had periumbilical abdominal pain that was initially mild and intermittent but in the past 24 hours has become sharp and constant.  She denies any nausea and vomiting.  Does have issues with chronic constipation and used enema 2 days ago and now having diarrhea.  Mild pain in her right lower side.  Denies fever, chills, chest pain, shortness of breath, dysuria.  Prior abdominal surgeries include cholecystectomy and hysterectomy.     Physical Exam   Triage Vital Signs: ED Triage Vitals  Encounter Vitals Group     BP 08/20/23 1749 118/60     Systolic BP Percentile --      Diastolic BP Percentile --      Pulse Rate 08/20/23 1749 86     Resp 08/20/23 1749 18     Temp 08/20/23 1749 98.8 F (37.1 C)     Temp Source 08/20/23 1749 Oral     SpO2 08/20/23 1749 99 %     Weight 08/20/23 1748 215 lb (97.5 kg)     Height 08/20/23 1748 5\' 2"  (1.575 m)     Head Circumference --      Peak Flow --      Pain Score 08/20/23 1748 7     Pain Loc --      Pain Education --      Exclude from Growth Chart --     Most recent vital signs: Vitals:   08/20/23 1749  BP: 118/60  Pulse: 86  Resp: 18  Temp: 98.8 F (37.1 C)  SpO2: 99%   Physical Exam: I have reviewed the vital signs and nursing notes. General: Awake, alert, no acute distress.  Nontoxic appearing. Head:  Atraumatic, normocephalic.   ENT:  EOM intact, PERRL. Oral mucosa is pink and moist with no lesions. Neck: Neck is supple with full range of motion, No meningeal signs. Cardiovascular:  RRR, No murmurs. Peripheral pulses palpable and equal bilaterally. Respiratory:  Symmetrical chest wall expansion.  No rhonchi, rales, or wheezes.  Good  air movement throughout.  No use of accessory muscles.   Musculoskeletal:  No cyanosis or edema. Moving extremities with full ROM Abdomen:  Soft, tender to palpation periumbilically and mild in her right lower quadrant, nondistended. Neuro:  GCS 15, moving all four extremities, interacting appropriately. Speech clear. Psych:  Calm, appropriate.   Skin:  Warm, dry, no rash.    ED Results / Procedures / Treatments   Labs (all labs ordered are listed, but only abnormal results are displayed) Labs Reviewed  CBC - Abnormal; Notable for the following components:      Result Value   WBC 11.0 (*)    All other components within normal limits  LIPASE, BLOOD  COMPREHENSIVE METABOLIC PANEL WITH GFR  URINALYSIS, ROUTINE W REFLEX MICROSCOPIC     EKG    RADIOLOGY Independently interpreted CT abdomen/pelvis with no acute findings   PROCEDURES:  Critical Care performed: No  .Ultrasound ED Peripheral IV (Provider)  Date/Time: 08/20/2023 8:56 PM  Performed by: Kandee Orion, MD Authorized by: Kandee Orion, MD   Procedure details:    Indications: multiple failed IV attempts and poor IV access  Skin Prep: chlorhexidine  gluconate     Location:  Right AC   Angiocath:  20 G   Bedside Ultrasound Guided: Yes     Images: not archived     Patient tolerated procedure without complications: Yes     Dressing applied: Yes      MEDICATIONS ORDERED IN ED: Medications  ketorolac  (TORADOL ) injection 30 mg (30 mg Intramuscular Given 08/20/23 1958)  iohexol  (OMNIPAQUE ) 300 MG/ML solution 100 mL (100 mLs Intravenous Contrast Given 08/20/23 2121)     IMPRESSION / MDM / ASSESSMENT AND PLAN / ED COURSE  I reviewed the triage vital signs and the nursing notes.                              Differential diagnosis includes, but is not limited to, appendicitis, colitis, enteritis, bowel obstruction, constipation, bowel spasms  Patient's presentation is most consistent with acute complicated  illness / injury requiring diagnostic workup.  Patient is a 28 year old female presenting today for periumbilical abdominal pain radiating down to her right lower quadrant.  I have concerns for appendicitis but with her chronic constipation issues we will evaluate for significant bowel spasms or signs of blockages.  Vital signs are stable and laboratory workup reassuring with only slight leukocytosis seen on CBC and otherwise normal lipase and CMP.  CT unremarkable.  Patient feeling better on reassessment.  Do see some inflammation around the lining of her stomach which may be causing her symptoms.  Will start her on Protonix .  Told to follow-up with PCP and given strict return precautions.  Clinical Course as of 08/20/23 2205  Wed Aug 20, 2023  2056 Patient feeling better following Toradol  [DW]    Clinical Course User Index [DW] Kandee Orion, MD     FINAL CLINICAL IMPRESSION(S) / ED DIAGNOSES   Final diagnoses:  Upper abdominal pain     Rx / DC Orders   ED Discharge Orders          Ordered    naproxen (NAPROSYN) 500 MG tablet  2 times daily with meals        08/20/23 2204    pantoprazole  (PROTONIX ) 40 MG tablet  Daily        08/20/23 2204             Note:  This document was prepared using Dragon voice recognition software and may include unintentional dictation errors.   Kandee Orion, MD 08/20/23 [phone]

## 2023-08-20 NOTE — Discharge Instructions (Signed)
 I have sent medication to your pharmacy to help with your pain symptoms.  Please take these as prescribed.  Follow-up with your primary care provider as needed for any worsening symptoms or continuation.  Your CT was otherwise reassuring today with only notion of some slight inflammation around your stomach.

## 2024-05-03 ENCOUNTER — Telehealth: Payer: Self-pay | Admitting: Nurse Practitioner

## 2024-05-03 DIAGNOSIS — U071 COVID-19: Secondary | ICD-10-CM

## 2024-05-03 MED ORDER — NIRMATRELVIR/RITONAVIR (PAXLOVID)TABLET: 3.0000 | ORAL_TABLET | Freq: Two times a day (BID) | ORAL | 0 refills | Status: AC
